# Patient Record
Sex: Male | Born: 1945 | Race: Black or African American | Hispanic: No | Marital: Married | Smoking: Former smoker
Health system: Southern US, Community
[De-identification: ages and names within clinical notes are randomized; demographics above are authoritative.]

## PROBLEM LIST (undated history)

## (undated) DIAGNOSIS — K760 Fatty (change of) liver, not elsewhere classified: Secondary | ICD-10-CM

## (undated) DIAGNOSIS — N400 Enlarged prostate without lower urinary tract symptoms: Secondary | ICD-10-CM

## (undated) DIAGNOSIS — E278 Other specified disorders of adrenal gland: Secondary | ICD-10-CM

## (undated) DIAGNOSIS — E119 Type 2 diabetes mellitus without complications: Secondary | ICD-10-CM

## (undated) DIAGNOSIS — G629 Polyneuropathy, unspecified: Secondary | ICD-10-CM

## (undated) DIAGNOSIS — I700 Atherosclerosis of aorta: Secondary | ICD-10-CM

---

## 2012-04-07 ENCOUNTER — Ambulatory Visit: Payer: Non-veteran care | Attending: Internal Medicine | Admitting: Physical Therapy

## 2012-04-07 DIAGNOSIS — R262 Difficulty in walking, not elsewhere classified: Secondary | ICD-10-CM | POA: Insufficient documentation

## 2012-04-07 DIAGNOSIS — IMO0001 Reserved for inherently not codable concepts without codable children: Secondary | ICD-10-CM | POA: Insufficient documentation

## 2012-04-21 ENCOUNTER — Ambulatory Visit: Payer: Non-veteran care | Admitting: Physical Therapy

## 2012-04-24 ENCOUNTER — Ambulatory Visit: Payer: Non-veteran care | Attending: Internal Medicine | Admitting: Physical Therapy

## 2012-04-24 DIAGNOSIS — R262 Difficulty in walking, not elsewhere classified: Secondary | ICD-10-CM | POA: Insufficient documentation

## 2012-04-24 DIAGNOSIS — IMO0001 Reserved for inherently not codable concepts without codable children: Secondary | ICD-10-CM | POA: Insufficient documentation

## 2012-04-28 ENCOUNTER — Ambulatory Visit: Payer: Non-veteran care | Admitting: Physical Therapy

## 2012-05-01 ENCOUNTER — Ambulatory Visit: Payer: Non-veteran care | Admitting: Physical Therapy

## 2012-05-05 ENCOUNTER — Ambulatory Visit: Payer: Non-veteran care | Admitting: Physical Therapy

## 2012-05-12 ENCOUNTER — Ambulatory Visit: Payer: Non-veteran care | Admitting: Physical Therapy

## 2012-06-25 ENCOUNTER — Ambulatory Visit: Payer: Non-veteran care | Attending: Internal Medicine | Admitting: Physical Therapy

## 2012-06-25 DIAGNOSIS — IMO0001 Reserved for inherently not codable concepts without codable children: Secondary | ICD-10-CM | POA: Insufficient documentation

## 2012-06-25 DIAGNOSIS — R262 Difficulty in walking, not elsewhere classified: Secondary | ICD-10-CM | POA: Insufficient documentation

## 2012-07-07 ENCOUNTER — Ambulatory Visit: Payer: Non-veteran care | Admitting: Physical Therapy

## 2012-07-10 ENCOUNTER — Ambulatory Visit: Payer: Non-veteran care | Admitting: Physical Therapy

## 2012-07-14 ENCOUNTER — Ambulatory Visit: Payer: Non-veteran care | Admitting: Physical Therapy

## 2012-07-17 ENCOUNTER — Ambulatory Visit: Payer: Non-veteran care | Admitting: Physical Therapy

## 2012-07-21 ENCOUNTER — Ambulatory Visit: Payer: Non-veteran care | Admitting: Physical Therapy

## 2012-07-24 ENCOUNTER — Ambulatory Visit: Payer: Non-veteran care | Attending: Internal Medicine | Admitting: Physical Therapy

## 2012-07-24 DIAGNOSIS — R262 Difficulty in walking, not elsewhere classified: Secondary | ICD-10-CM | POA: Insufficient documentation

## 2012-07-24 DIAGNOSIS — IMO0001 Reserved for inherently not codable concepts without codable children: Secondary | ICD-10-CM | POA: Insufficient documentation

## 2012-07-28 ENCOUNTER — Ambulatory Visit: Payer: Non-veteran care | Admitting: Physical Therapy

## 2012-07-31 ENCOUNTER — Ambulatory Visit: Payer: Non-veteran care | Admitting: Physical Therapy

## 2012-08-26 ENCOUNTER — Ambulatory Visit: Payer: Non-veteran care | Attending: Internal Medicine | Admitting: Physical Therapy

## 2012-08-26 DIAGNOSIS — IMO0001 Reserved for inherently not codable concepts without codable children: Secondary | ICD-10-CM | POA: Insufficient documentation

## 2012-08-26 DIAGNOSIS — R262 Difficulty in walking, not elsewhere classified: Secondary | ICD-10-CM | POA: Insufficient documentation

## 2012-08-28 ENCOUNTER — Ambulatory Visit: Payer: Non-veteran care | Admitting: Physical Therapy

## 2012-09-01 ENCOUNTER — Ambulatory Visit: Payer: Non-veteran care | Admitting: Physical Therapy

## 2012-09-04 ENCOUNTER — Ambulatory Visit: Payer: Non-veteran care | Admitting: Physical Therapy

## 2012-09-08 ENCOUNTER — Ambulatory Visit: Payer: Non-veteran care | Admitting: Physical Therapy

## 2012-09-11 ENCOUNTER — Ambulatory Visit: Payer: Non-veteran care | Admitting: Physical Therapy

## 2012-09-15 ENCOUNTER — Ambulatory Visit: Payer: Non-veteran care | Admitting: Physical Therapy

## 2012-09-22 ENCOUNTER — Ambulatory Visit: Payer: Non-veteran care | Admitting: Physical Therapy

## 2014-07-30 ENCOUNTER — Encounter (HOSPITAL_COMMUNITY): Payer: Self-pay | Admitting: Emergency Medicine

## 2014-07-30 ENCOUNTER — Emergency Department (HOSPITAL_COMMUNITY): Payer: Non-veteran care

## 2014-07-30 ENCOUNTER — Emergency Department (HOSPITAL_COMMUNITY)
Admission: EM | Admit: 2014-07-30 | Discharge: 2014-07-31 | Disposition: A | Discharge status: Home / Self Care | Payer: Non-veteran care | Attending: Emergency Medicine | Admitting: Emergency Medicine

## 2014-07-30 DIAGNOSIS — S59909A Unspecified injury of unspecified elbow, initial encounter: Secondary | ICD-10-CM | POA: Insufficient documentation

## 2014-07-30 DIAGNOSIS — S1093XA Contusion of unspecified part of neck, initial encounter: Secondary | ICD-10-CM

## 2014-07-30 DIAGNOSIS — Z79899 Other long term (current) drug therapy: Secondary | ICD-10-CM | POA: Insufficient documentation

## 2014-07-30 DIAGNOSIS — E119 Type 2 diabetes mellitus without complications: Secondary | ICD-10-CM | POA: Insufficient documentation

## 2014-07-30 DIAGNOSIS — Y9289 Other specified places as the place of occurrence of the external cause: Secondary | ICD-10-CM | POA: Insufficient documentation

## 2014-07-30 DIAGNOSIS — S6990XA Unspecified injury of unspecified wrist, hand and finger(s), initial encounter: Secondary | ICD-10-CM

## 2014-07-30 DIAGNOSIS — Z794 Long term (current) use of insulin: Secondary | ICD-10-CM | POA: Insufficient documentation

## 2014-07-30 DIAGNOSIS — W108XXA Fall (on) (from) other stairs and steps, initial encounter: Secondary | ICD-10-CM | POA: Insufficient documentation

## 2014-07-30 DIAGNOSIS — W1809XA Striking against other object with subsequent fall, initial encounter: Secondary | ICD-10-CM | POA: Insufficient documentation

## 2014-07-30 DIAGNOSIS — S0083XA Contusion of other part of head, initial encounter: Secondary | ICD-10-CM | POA: Insufficient documentation

## 2014-07-30 DIAGNOSIS — S0003XA Contusion of scalp, initial encounter: Secondary | ICD-10-CM | POA: Insufficient documentation

## 2014-07-30 DIAGNOSIS — Y9301 Activity, walking, marching and hiking: Secondary | ICD-10-CM | POA: Insufficient documentation

## 2014-07-30 DIAGNOSIS — S0191XA Laceration without foreign body of unspecified part of head, initial encounter: Secondary | ICD-10-CM

## 2014-07-30 DIAGNOSIS — S0990XA Unspecified injury of head, initial encounter: Secondary | ICD-10-CM | POA: Insufficient documentation

## 2014-07-30 DIAGNOSIS — Z87448 Personal history of other diseases of urinary system: Secondary | ICD-10-CM | POA: Insufficient documentation

## 2014-07-30 DIAGNOSIS — S59919A Unspecified injury of unspecified forearm, initial encounter: Secondary | ICD-10-CM

## 2014-07-30 DIAGNOSIS — S0100XA Unspecified open wound of scalp, initial encounter: Secondary | ICD-10-CM | POA: Insufficient documentation

## 2014-07-30 DIAGNOSIS — W19XXXA Unspecified fall, initial encounter: Secondary | ICD-10-CM

## 2014-07-30 DIAGNOSIS — T1490XA Injury, unspecified, initial encounter: Secondary | ICD-10-CM | POA: Diagnosis not present

## 2014-07-30 HISTORY — DX: Type 2 diabetes mellitus without complications: E11.9

## 2014-07-30 HISTORY — DX: Benign prostatic hyperplasia without lower urinary tract symptoms: N40.0

## 2014-07-30 LAB — I-STAT CHEM 8, ED
BUN: 13 mg/dL (ref 6–23)
CHLORIDE: 104 meq/L (ref 96–112)
Calcium, Ion: 1.09 mmol/L — ABNORMAL LOW (ref 1.13–1.30)
Creatinine, Ser: 1 mg/dL (ref 0.50–1.35)
Glucose, Bld: 168 mg/dL — ABNORMAL HIGH (ref 70–99)
HCT: 41 % (ref 39.0–52.0)
HEMOGLOBIN: 13.9 g/dL (ref 13.0–17.0)
Potassium: 3.4 mEq/L — ABNORMAL LOW (ref 3.7–5.3)
Sodium: 140 mEq/L (ref 137–147)
TCO2: 21 mmol/L (ref 0–100)

## 2014-07-30 LAB — BASIC METABOLIC PANEL
ANION GAP: 16 — AB (ref 5–15)
BUN: 13 mg/dL (ref 6–23)
CHLORIDE: 103 meq/L (ref 96–112)
CO2: 22 mEq/L (ref 19–32)
CREATININE: 0.98 mg/dL (ref 0.50–1.35)
Calcium: 9.1 mg/dL (ref 8.4–10.5)
GFR, EST NON AFRICAN AMERICAN: 83 mL/min — AB (ref >90)
Glucose, Bld: 165 mg/dL — ABNORMAL HIGH (ref 70–99)
Potassium: 3.8 mEq/L (ref 3.7–5.3)
Sodium: 141 mEq/L (ref 137–147)

## 2014-07-30 LAB — CBC
HCT: 38 % — ABNORMAL LOW (ref 39.0–52.0)
Hemoglobin: 12.2 g/dL — ABNORMAL LOW (ref 13.0–17.0)
MCH: 26.8 pg (ref 26.0–34.0)
MCHC: 32.1 g/dL (ref 30.0–36.0)
MCV: 83.5 fL (ref 78.0–100.0)
PLATELETS: 221 10*3/uL (ref 150–400)
RBC: 4.55 MIL/uL (ref 4.22–5.81)
RDW: 14.2 % (ref 11.5–15.5)
WBC: 13.5 10*3/uL — ABNORMAL HIGH (ref 4.0–10.5)

## 2014-07-30 LAB — PROTIME-INR
INR: 1.02 (ref 0.00–1.49)
Prothrombin Time: 13.4 seconds (ref 11.6–15.2)

## 2014-07-30 LAB — CBG MONITORING, ED: Glucose-Capillary: 158 mg/dL — ABNORMAL HIGH (ref 70–99)

## 2014-07-30 MED ORDER — LIDOCAINE HCL (PF) 1 % IJ SOLN
INTRAMUSCULAR | Status: AC
  Filled 2014-07-30: qty 5

## 2014-07-30 NOTE — ED Notes (Signed)
Per EMS, patient was walking up the stairs when he fell back, hit posterior head on wall. Unwitnessed fall, wife was gone approx 1 hour and \\found  patient at bottom of stairs. Probable LOC, pt originally confused, vomiting. Pt. Alert and oriented on arrival to ED

## 2014-07-30 NOTE — ED Notes (Addendum)
Pt transported to CT ?

## 2014-07-30 NOTE — ED Notes (Signed)
Pt placed on monitor upon arrival to room. Pt monitored by 12 lead, blood pressure, and pulse ox.  

## 2014-07-30 NOTE — ED Notes (Signed)
Pt placed back on monitor upon return to room from CT scan. Pt monitored by 12 lead, blood pressure, and pulse ox. Pt remains on 2L via [place]

## 2014-07-30 NOTE — ED Provider Notes (Signed)
I saw and evaluated the patient, reviewed the resident's note and I agree with the findings and plan.   EKG Interpretation   Date/Time:  Friday July 30 2014 20:47:22 EDT Ventricular Rate:  83 PR Interval:  151 QRS Duration: 94 QT Interval:  378 QTC Calculation: 444 R Axis:   87 Text Interpretation:  Sinus rhythm Borderline right axis deviation Minimal  ST depression, inferior leads No previous ECGs available Confirmed by  Nels Munn  MD, Evamarie Raetz [phone]) on 07/30/2014 9:02:14 PM      Results for orders placed during the hospital encounter of 07/30/14  CBC      Result Value Ref Range   WBC 13.5 (*) 4.0 - 10.5 K/uL   RBC 4.55  4.22 - 5.81 MIL/uL   Hemoglobin 12.2 (*) 13.0 - 17.0 g/dL   HCT 60.4 (*) 54.0 - 98.1 %   MCV 83.5  78.0 - 100.0 fL   MCH 26.8  26.0 - 34.0 pg   MCHC 32.1  30.0 - 36.0 g/dL   RDW 19.1  47.8 - 29.5 %   Platelets 221  150 - 400 K/uL  BASIC METABOLIC PANEL      Result Value Ref Range   Sodium 141  137 - 147 mEq/L   Potassium 3.8  3.7 - 5.3 mEq/L   Chloride 103  96 - 112 mEq/L   CO2 22  19 - 32 mEq/L   Glucose, Bld 165 (*) 70 - 99 mg/dL   BUN 13  6 - 23 mg/dL   Creatinine, Ser 6.21  0.50 - 1.35 mg/dL   Calcium 9.1  8.4 - 30.8 mg/dL   GFR calc non Af Amer 83 (*) >90 mL/min   GFR calc Af Amer >90  >90 mL/min   Anion gap 16 (*) 5 - 15  PROTIME-INR      Result Value Ref Range   Prothrombin Time 13.4  11.6 - 15.2 seconds   INR 1.02  0.00 - 1.49  CBG MONITORING, ED      Result Value Ref Range   Glucose-Capillary 158 (*) 70 - 99 mg/dL  I-STAT CHEM 8, ED      Result Value Ref Range   Sodium 140  137 - 147 mEq/L   Potassium 3.4 (*) 3.7 - 5.3 mEq/L   Chloride 104  96 - 112 mEq/L   BUN 13  6 - 23 mg/dL   Creatinine, Ser 6.57  0.50 - 1.35 mg/dL   Glucose, Bld 846 (*) 70 - 99 mg/dL   Calcium, Ion 9.62 (*) 1.13 - 1.30 mmol/L   TCO2 21  0 - 100 mmol/L   Hemoglobin 13.9  13.0 - 17.0 g/dL   HCT 95.2  84.1 - 32.4 %   Ct Head Wo Contrast  07/30/2014   CLINICAL  DATA:  FALL  EXAM: CT HEAD WITHOUT CONTRAST  CT CERVICAL SPINE WITHOUT CONTRAST  TECHNIQUE: Multidetector CT imaging of the head and cervical spine was performed following the standard protocol without intravenous contrast. Multiplanar CT image reconstructions of the cervical spine were also generated.  COMPARISON:  None.  FINDINGS: CT HEAD FINDINGS  Mild atrophy with chronic small vessel ischemic changes present.  There is no acute intracranial hemorrhage or infarct. No mass lesion or midline shift. Gray-white matter differentiation is well maintained. Ventricles are normal in size without evidence of hydrocephalus. CSF containing spaces are within normal limits. No extra-axial fluid collection.  The calvarium is intact.  Orbital soft tissues are within normal limits.  Moderate opacity present within the left frontoethmoidal recess. Otherwise, the paranasal sinuses are largely clear. No mastoid effusion.  Left parieto-occipital scalp contusion present.  CT CERVICAL SPINE FINDINGS  The vertebral bodies are normally aligned with preservation of the normal cervical lordosis. Vertebral body heights are preserved. Normal C1-2 articulations are intact. No prevertebral soft tissue swelling. No acute fracture or listhesis.  Fairly severe multilevel degenerative disc disease seen throughout the cervical spine as evidenced by intervertebral disc space narrowing, endplate sclerosis, and osteophytosis. Findings most prevalent at C5-6 and C6-7.  Visualized soft tissues of the neck are within normal limits. Visualized lung apices are clear without evidence of apical pneumothorax.  IMPRESSION: CT BRAIN:  1. No acute intracranial process. 2. Left parieto-occipital scalp contusion. CT CERVICAL SPINE:  1. No acute traumatic injury within the cervical spine. 2. Advanced multilevel degenerative disc disease throughout the cervical spine most severe at C5-6 and C6-7.   Electronically Signed   By: Rise Mu M.D.   On:  07/30/2014 22:33   Ct Cervical Spine Wo Contrast  07/30/2014   CLINICAL DATA:  FALL  EXAM: CT HEAD WITHOUT CONTRAST  CT CERVICAL SPINE WITHOUT CONTRAST  TECHNIQUE: Multidetector CT imaging of the head and cervical spine was performed following the standard protocol without intravenous contrast. Multiplanar CT image reconstructions of the cervical spine were also generated.  COMPARISON:  None.  FINDINGS: CT HEAD FINDINGS  Mild atrophy with chronic small vessel ischemic changes present.  There is no acute intracranial hemorrhage or infarct. No mass lesion or midline shift. Gray-white matter differentiation is well maintained. Ventricles are normal in size without evidence of hydrocephalus. CSF containing spaces are within normal limits. No extra-axial fluid collection.  The calvarium is intact.  Orbital soft tissues are within normal limits.  Moderate opacity present within the left frontoethmoidal recess. Otherwise, the paranasal sinuses are largely clear. No mastoid effusion.  Left parieto-occipital scalp contusion present.  CT CERVICAL SPINE FINDINGS  The vertebral bodies are normally aligned with preservation of the normal cervical lordosis. Vertebral body heights are preserved. Normal C1-2 articulations are intact. No prevertebral soft tissue swelling. No acute fracture or listhesis.  Fairly severe multilevel degenerative disc disease seen throughout the cervical spine as evidenced by intervertebral disc space narrowing, endplate sclerosis, and osteophytosis. Findings most prevalent at C5-6 and C6-7.  Visualized soft tissues of the neck are within normal limits. Visualized lung apices are clear without evidence of apical pneumothorax.  IMPRESSION: CT BRAIN:  1. No acute intracranial process. 2. Left parieto-occipital scalp contusion. CT CERVICAL SPINE:  1. No acute traumatic injury within the cervical spine. 2. Advanced multilevel degenerative disc disease throughout the cervical spine most severe at C5-6 and  C6-7.   Electronically Signed   By: Rise Mu M.D.   On: 07/30/2014 22:33   Dg Chest Portable 1 View  07/30/2014   CLINICAL DATA:  Syncope and fall. Headache and dizziness. Cough. History of smoking.  EXAM: PORTABLE CHEST - 1 VIEW  COMPARISON:  None.  FINDINGS: The lungs are hypoexpanded. Mild vascular congestion is noted. There is no evidence of focal opacification, pleural effusion or pneumothorax.  The cardiomediastinal silhouette is borderline normal in size. No acute osseous abnormalities are seen.  IMPRESSION: Lungs hypoexpanded. Mild vascular congestion noted. No displaced rib fracture seen.   Electronically Signed   By: Roanna Raider M.D.   On: 07/30/2014 23:09    Patient seen by me. Patient plain x-rays are still pending. Patient with unwitnessed fall  at home possibly, fallen down some stairs. Patient's wife had left for about an hour when she came back found at the bottom of the stairs she supposed to use a chair lift to go upstairs. Patient was originally confused and had vomited. Patient now alert and oriented upon arrival to the emergency department. Head CT without significant findings. Patient did have a scalp hematoma and laceration to the left occiput area. This was repaired with sutures by the resident. Patient CT of the neck was negative as well. Plain films of the wrist in others are still pending. If negative patient can be discharged home.  Vanetta MuldersScott Bravery Ketcham, MD 07/30/14 [phone]

## 2014-07-30 NOTE — ED Notes (Signed)
Pt. States he was walking up the stairs and lost his balance and fell backwards down the stairs. Denies dizziness or CP prior to fall. Per EMS pt originally was confused, he is alert and oriented at this time. Only c/o head pain, denies neck or back pain. No other injuries noted. Hematoma to left posterior head. Pt does not take blood thinners.

## 2014-07-30 NOTE — ED Provider Notes (Signed)
CSN: [id number]     Arrival date & time 07/30/14  2040 History   First MD Initiated Contact with Patient 07/30/14 2105     Chief Complaint  Patient presents with  . Fall   Hector Taft is a 68 year old AAM with PMH of DM who presents today after fall.  Patient is supposed to use a chairlift. However he did not do so today. This is likely to have occurred around 7 PM. Nobody else was at home with him. Hours later patient was found down and brought in by EMS. Patient was found at the bottom of the stairs at home. Does not believe that she lost consciousness. Had no illness or bad feelings prior to falling. Patient says he slipped. EMS placed patient in a c-collar patient. He does not complain of any neck or head pain. He does endorse that his right forearm hurts a little. It's a mild, aching or bruising sensation. Tdap UTD.  He denies palpitations, CP, SOB, fever, chills, constipation, hematemesis, dysuria, hematuria, sick contacts, or recent travel.   (Consider location/radiation/quality/duration/timing/severity/associated sxs/prior Treatment) Patient is a 68 y.o. male presenting with fall. The history is provided by the patient and a relative.  Fall This is a new problem. The current episode started today. Pertinent negatives include no abdominal pain, arthralgias, chest pain, chills, congestion, coughing, diaphoresis, fatigue, fever, headaches, myalgias, nausea, neck pain, numbness, urinary symptoms, visual change, vomiting or weakness. Nothing aggravates the symptoms. He has tried nothing for the symptoms.    Past Medical History  Diagnosis Date  . Diabetes mellitus without complication   . BPH (benign prostatic hyperplasia)    History reviewed. No pertinent past surgical history. No family history on file. History  Substance Use Topics  . Smoking status: Never Smoker   . Smokeless tobacco: Not on file  . Alcohol Use: No    Review of Systems  Constitutional: Negative for fever,  chills, diaphoresis and fatigue.  HENT: Negative for congestion.   Respiratory: Negative for cough and shortness of breath.   Cardiovascular: Negative for chest pain, palpitations and leg swelling.  Gastrointestinal: Negative for nausea, vomiting, abdominal pain, diarrhea, constipation and abdominal distention.  Genitourinary: Negative for dysuria, frequency, flank pain and decreased urine volume.  Musculoskeletal: Negative for arthralgias, myalgias and neck pain.  Neurological: Negative for dizziness, speech difficulty, weakness, light-headedness, numbness and headaches.  All other systems reviewed and are negative.     Allergies  Review of patient's allergies indicates no known allergies.  Home Medications   Prior to Admission medications   Medication Sig Start Date End Date Taking? Authorizing Provider  atorvastatin (LIPITOR) 80 MG tablet Take 40 mg by mouth daily.   Yes Historical Provider, MD  finasteride (PROSCAR) 5 MG tablet Take 5 mg by mouth at bedtime.   Yes Historical Provider, MD  glipiZIDE (GLUCOTROL) 10 MG tablet Take 20 mg by mouth 2 (two) times daily before a meal.   Yes Historical Provider, MD  insulin glargine (LANTUS) 100 UNIT/ML injection Inject 15 Units into the skin at bedtime.   Yes Historical Provider, MD  losartan (COZAAR) 50 MG tablet Take 50 mg by mouth daily.   Yes Historical Provider, MD  metFORMIN (GLUCOPHAGE) 1000 MG tablet Take 1,000 mg by mouth 2 (two) times daily with a meal.   Yes Historical Provider, MD  prazosin (MINIPRESS) 2 MG capsule Take 4 mg by mouth at bedtime.   Yes Historical Provider, MD   BP 150/57  Pulse 88  Temp(Src) 97.8  F (36.6 C) (Oral)  Resp 13  Ht 5\' 10"  (1.778 m)  Wt 227 lb (102.967 kg)  BMI 32.57 kg/m2  SpO2 94% Physical Exam  Nursing note and vitals reviewed. Constitutional: He is oriented to person, place, and time. He appears well-developed and well-nourished. No distress.  HENT:  Head: Normocephalic and atraumatic.      Eyes: Pupils are equal, round, and reactive to light.  Neck: Normal range of motion.  Cardiovascular: Normal rate, regular rhythm, normal heart sounds and intact distal pulses.  Exam reveals no gallop and no friction rub.   No murmur heard. Pulmonary/Chest: Effort normal and breath sounds normal. No respiratory distress. He has no wheezes. He has no rales. He exhibits no tenderness.  Abdominal: Soft. Bowel sounds are normal. He exhibits no distension and no mass. There is no tenderness. There is no rebound and no guarding.  Musculoskeletal: Normal range of motion. He exhibits tenderness (right anterior forearm). He exhibits no edema.  Lymphadenopathy:    He has no cervical adenopathy.  Neurological: He is alert and oriented to person, place, and time. No cranial nerve deficit. Coordination normal.  Skin: Skin is warm and dry. He is not diaphoretic.    ED Course  LACERATION REPAIR Date/Time: 07/31/2014 12:12 AM Performed by: Rachelle Hora Authorized by: Rachelle Hora Consent: Verbal consent obtained. Risks and benefits: risks, benefits and alternatives were discussed Patient identity confirmed: verbally with patient Time out: Immediately prior to procedure a "time out" was called to verify the correct patient, procedure, equipment, support staff and site/side marked as required. Body area: head/neck Location details: scalp Laceration length: 3 cm Foreign bodies: no foreign bodies Tendon involvement: none Nerve involvement: none Vascular damage: no Anesthesia: local infiltration Local anesthetic: lidocaine 1% without epinephrine Anesthetic total: 5 ml Patient sedated: no Preparation: Patient was prepped and draped in the usual sterile fashion. Irrigation solution: saline Irrigation method: syringe Amount of cleaning: standard Debridement: none Degree of undermining: none Skin closure: 4-0 Prolene Number of sutures: 3 Technique: simple Approximation: close Dressing: 4x4  sterile gauze Patient tolerance: Patient tolerated the procedure well with no immediate complications.   (including critical care time) Labs Review Labs Reviewed  CBC - Abnormal; Notable for the following:    WBC 13.5 (*)    Hemoglobin 12.2 (*)    HCT 38.0 (*)    All other components within normal limits  BASIC METABOLIC PANEL - Abnormal; Notable for the following:    Glucose, Bld 165 (*)    GFR calc non Af Amer 83 (*)    Anion gap 16 (*)    All other components within normal limits  URINALYSIS, ROUTINE W REFLEX MICROSCOPIC - Abnormal; Notable for the following:    APPearance TURBID (*)    Specific Gravity, Urine 1.031 (*)    Glucose, UA 250 (*)    Ketones, ur 15 (*)    Protein, ur 30 (*)    All other components within normal limits  CBG MONITORING, ED - Abnormal; Notable for the following:    Glucose-Capillary 158 (*)    All other components within normal limits  I-STAT CHEM 8, ED - Abnormal; Notable for the following:    Potassium 3.4 (*)    Glucose, Bld 168 (*)    Calcium, Ion 1.09 (*)    All other components within normal limits  PROTIME-INR  URINE MICROSCOPIC-ADD ON    Imaging Review Dg Elbow 2 Views Right  07/31/2014   CLINICAL DATA:  Fall  EXAM:  RIGHT ELBOW - 2 VIEW  COMPARISON:  None.  FINDINGS: There is no evidence of fracture, dislocation, or joint effusion. No focal osseous lesion. Degenerative spurring seen at the radial head and olecranon. Soft tissues are unremarkable.  IMPRESSION: No acute fracture or dislocation.   Electronically Signed   By: Rise MuBenjamin  McClintock M.D.   On: 07/31/2014 01:10   Dg Forearm Right  07/31/2014   CLINICAL DATA:  Status post fall down steps. Right arm and wrist pain.  EXAM: RIGHT FOREARM - 2 VIEW  COMPARISON:  None.  FINDINGS: There is no evidence of fracture or dislocation. The radius and ulna appear intact. The elbow joint is incompletely assessed, but appears grossly unremarkable. The carpal rows are grossly intact and demonstrate  normal alignment. No significant soft tissue abnormalities are characterized on radiograph.  IMPRESSION: No evidence of fracture or dislocation.   Electronically Signed   By: Roanna RaiderJeffery  Chang M.D.   On: 07/31/2014 01:10   Dg Wrist Complete Right  07/31/2014   CLINICAL DATA:  Status post fall down steps. Right arm and wrist pain.  EXAM: RIGHT WRIST - COMPLETE 3+ VIEW  COMPARISON:  None.  FINDINGS: There is no evidence of fracture or dislocation. The carpal rows are intact, and demonstrate normal alignment. Minimal degenerative change is noted at the radial aspect of the carpal rows.  No significant soft tissue abnormalities are seen. A peripheral IV catheter is seen overlying the proximal hand.  IMPRESSION: No evidence of fracture or dislocation.   Electronically Signed   By: Roanna RaiderJeffery  Chang M.D.   On: 07/31/2014 01:11   Ct Head Wo Contrast  07/30/2014   CLINICAL DATA:  FALL  EXAM: CT HEAD WITHOUT CONTRAST  CT CERVICAL SPINE WITHOUT CONTRAST  TECHNIQUE: Multidetector CT imaging of the head and cervical spine was performed following the standard protocol without intravenous contrast. Multiplanar CT image reconstructions of the cervical spine were also generated.  COMPARISON:  None.  FINDINGS: CT HEAD FINDINGS  Mild atrophy with chronic small vessel ischemic changes present.  There is no acute intracranial hemorrhage or infarct. No mass lesion or midline shift. Gray-white matter differentiation is well maintained. Ventricles are normal in size without evidence of hydrocephalus. CSF containing spaces are within normal limits. No extra-axial fluid collection.  The calvarium is intact.  Orbital soft tissues are within normal limits.  Moderate opacity present within the left frontoethmoidal recess. Otherwise, the paranasal sinuses are largely clear. No mastoid effusion.  Left parieto-occipital scalp contusion present.  CT CERVICAL SPINE FINDINGS  The vertebral bodies are normally aligned with preservation of the normal  cervical lordosis. Vertebral body heights are preserved. Normal C1-2 articulations are intact. No prevertebral soft tissue swelling. No acute fracture or listhesis.  Fairly severe multilevel degenerative disc disease seen throughout the cervical spine as evidenced by intervertebral disc space narrowing, endplate sclerosis, and osteophytosis. Findings most prevalent at C5-6 and C6-7.  Visualized soft tissues of the neck are within normal limits. Visualized lung apices are clear without evidence of apical pneumothorax.  IMPRESSION: CT BRAIN:  1. No acute intracranial process. 2. Left parieto-occipital scalp contusion. CT CERVICAL SPINE:  1. No acute traumatic injury within the cervical spine. 2. Advanced multilevel degenerative disc disease throughout the cervical spine most severe at C5-6 and C6-7.   Electronically Signed   By: Rise MuBenjamin  McClintock M.D.   On: 07/30/2014 22:33   Ct Cervical Spine Wo Contrast  07/30/2014   CLINICAL DATA:  FALL  EXAM: CT HEAD WITHOUT CONTRAST  CT CERVICAL SPINE WITHOUT CONTRAST  TECHNIQUE: Multidetector CT imaging of the head and cervical spine was performed following the standard protocol without intravenous contrast. Multiplanar CT image reconstructions of the cervical spine were also generated.  COMPARISON:  None.  FINDINGS: CT HEAD FINDINGS  Mild atrophy with chronic small vessel ischemic changes present.  There is no acute intracranial hemorrhage or infarct. No mass lesion or midline shift. Gray-white matter differentiation is well maintained. Ventricles are normal in size without evidence of hydrocephalus. CSF containing spaces are within normal limits. No extra-axial fluid collection.  The calvarium is intact.  Orbital soft tissues are within normal limits.  Moderate opacity present within the left frontoethmoidal recess. Otherwise, the paranasal sinuses are largely clear. No mastoid effusion.  Left parieto-occipital scalp contusion present.  CT CERVICAL SPINE FINDINGS  The  vertebral bodies are normally aligned with preservation of the normal cervical lordosis. Vertebral body heights are preserved. Normal C1-2 articulations are intact. No prevertebral soft tissue swelling. No acute fracture or listhesis.  Fairly severe multilevel degenerative disc disease seen throughout the cervical spine as evidenced by intervertebral disc space narrowing, endplate sclerosis, and osteophytosis. Findings most prevalent at C5-6 and C6-7.  Visualized soft tissues of the neck are within normal limits. Visualized lung apices are clear without evidence of apical pneumothorax.  IMPRESSION: CT BRAIN:  1. No acute intracranial process. 2. Left parieto-occipital scalp contusion. CT CERVICAL SPINE:  1. No acute traumatic injury within the cervical spine. 2. Advanced multilevel degenerative disc disease throughout the cervical spine most severe at C5-6 and C6-7.   Electronically Signed   By: Rise Mu M.D.   On: 07/30/2014 22:33   Dg Chest Portable 1 View  07/30/2014   CLINICAL DATA:  Syncope and fall. Headache and dizziness. Cough. History of smoking.  EXAM: PORTABLE CHEST - 1 VIEW  COMPARISON:  None.  FINDINGS: The lungs are hypoexpanded. Mild vascular congestion is noted. There is no evidence of focal opacification, pleural effusion or pneumothorax.  The cardiomediastinal silhouette is borderline normal in size. No acute osseous abnormalities are seen.  IMPRESSION: Lungs hypoexpanded. Mild vascular congestion noted. No displaced rib fracture seen.   Electronically Signed   By: Roanna Raider M.D.   On: 07/30/2014 23:09     EKG Interpretation   Date/Time:  Friday July 30 2014 20:47:22 EDT Ventricular Rate:  83 PR Interval:  151 QRS Duration: 94 QT Interval:  378 QTC Calculation: 444 R Axis:   87 Text Interpretation:  Sinus rhythm Borderline right axis deviation Minimal  ST depression, inferior leads No previous ECGs available Confirmed by  ZACKOWSKI  MD, SCOTT [phone]) on 07/30/2014  9:02:14 PM      MDM   68 year old AAM who presents today after fall. Please see history of present illness for details. On exam patient in NAD, AF VSS. Patient has small lac to the left posterior scalp no active bleeding. Denies any blood thinners. No focal neural deficits. No battle sign, TMs clear bilaterally, no nasal septal hematoma.  Patient endorses tenderness to palpation over the anterior right proximal forearm. No bruising or lacerations here. Will obtain CT head and neck, basic labs, UA, CXR, EKG, and x-ray images of the right wrist, forearm, and elbow.  CT head and neck within normal limits with no fractures and no intracranial hemorrhage. At that time at this time c-collar was removed the patient has normal range of motion with no pain. C-spine clear. Basic labs within normal limits with EKG showing no  signs of ischemia, arrhythmia. CXR w/no fxr or cardiovascular abnormality.  Lac repair was done with 3 sutures of Prolene. Please see lactic repair note above. Patient tolerated this well. Awaiting x-ray imaging.   X-rays of his right wrist forearm and elbow all within normal limits, no acute fracture. Stable for discharge home at this time. Will follow up with primary care provider [facility]) in 7-14 days for suture removal and wound check. Strict return precautions include worsening falls presyncope or syncopal episodes, severe headache or focal neural deficits/AMS.  Final diagnoses:  Fall, initial encounter  Laceration of head, initial encounter  Scalp hematoma, initial encounter    Pt was seen under the supervision of Dr. Deretha Emory.     Rachelle Hora, MD 07/31/14 1610  Rachelle Hora, MD 07/31/14 [phone]

## 2014-07-31 ENCOUNTER — Emergency Department (HOSPITAL_COMMUNITY): Payer: Non-veteran care

## 2014-07-31 LAB — URINALYSIS, ROUTINE W REFLEX MICROSCOPIC
BILIRUBIN URINE: NEGATIVE
Glucose, UA: 250 mg/dL — AB
HGB URINE DIPSTICK: NEGATIVE
Ketones, ur: 15 mg/dL — AB
Leukocytes, UA: NEGATIVE
Nitrite: NEGATIVE
PROTEIN: 30 mg/dL — AB
Specific Gravity, Urine: 1.031 — ABNORMAL HIGH (ref 1.005–1.030)
UROBILINOGEN UA: 0.2 mg/dL (ref 0.0–1.0)
pH: 5 (ref 5.0–8.0)

## 2014-07-31 LAB — URINE MICROSCOPIC-ADD ON

## 2014-07-31 NOTE — Discharge Instructions (Signed)
Laceration Care, Adult °A laceration is a cut or lesion that goes through all layers of the skin and into the tissue just beneath the skin. °TREATMENT  °Some lacerations may not require closure. Some lacerations may not be able to be closed due to an increased risk of infection. It is important to see your caregiver as soon as possible after an injury to minimize the risk of infection and maximize the opportunity for successful closure. °If closure is appropriate, pain medicines may be given, if needed. The wound will be cleaned to help prevent infection. Your caregiver will use stitches (sutures), staples, wound glue (adhesive), or skin adhesive strips to repair the laceration. These tools bring the skin edges together to allow for faster healing and a better cosmetic outcome. However, all wounds will heal with a scar. Once the wound has healed, scarring can be minimized by covering the wound with sunscreen during the day for 1 full year. °HOME CARE INSTRUCTIONS  °For sutures or staples: °· Keep the wound clean and dry. °· If you were given a bandage (dressing), you should change it at least once a day. Also, change the dressing if it becomes wet or dirty, or as directed by your caregiver. °· Wash the wound with soap and water 2 times a day. Rinse the wound off with water to remove all soap. Pat the wound dry with a clean towel. °· After cleaning, apply a thin layer of the antibiotic ointment as recommended by your caregiver. This will help prevent infection and keep the dressing from sticking. °· You may shower as usual after the first 24 hours. Do not soak the wound in water until the sutures are removed. °· Only take over-the-counter or prescription medicines for pain, discomfort, or fever as directed by your caregiver. °· Get your sutures or staples removed as directed by your caregiver. °For skin adhesive strips: °· Keep the wound clean and dry. °· Do not get the skin adhesive strips wet. You may bathe  carefully, using caution to keep the wound dry. °· If the wound gets wet, pat it dry with a clean towel. °· Skin adhesive strips will fall off on their own. You may trim the strips as the wound heals. Do not remove skin adhesive strips that are still stuck to the wound. They will fall off in time. °For wound adhesive: °· You may briefly wet your wound in the shower or bath. Do not soak or scrub the wound. Do not swim. Avoid periods of heavy perspiration until the skin adhesive has fallen off on its own. After showering or bathing, gently pat the wound dry with a clean towel. °· Do not apply liquid medicine, cream medicine, or ointment medicine to your wound while the skin adhesive is in place. This may loosen the film before your wound is healed. °· If a dressing is placed over the wound, be careful not to apply tape directly over the skin adhesive. This may cause the adhesive to be pulled off before the wound is healed. °· Avoid prolonged exposure to sunlight or tanning lamps while the skin adhesive is in place. Exposure to ultraviolet light in the first year will darken the scar. °· The skin adhesive will usually remain in place for 5 to 10 days, then naturally fall off the skin. Do not pick at the adhesive film. °You may need a tetanus shot if: °· You cannot remember when you had your last tetanus shot. °· You have never had a tetanus   shot. If you get a tetanus shot, your arm may swell, get red, and feel warm to the touch. This is common and not a problem. If you need a tetanus shot and you choose not to have one, there is a rare chance of getting tetanus. Sickness from tetanus can be serious. SEEK MEDICAL CARE IF:   You have redness, swelling, or increasing pain in the wound.  You see a red line that goes away from the wound.  You have yellowish-white fluid (pus) coming from the wound.  You have a fever.  You notice a bad smell coming from the wound or dressing.  Your wound breaks open before or  after sutures have been removed.  You notice something coming out of the wound such as wood or glass.  Your wound is on your hand or foot and you cannot move a finger or toe. SEEK IMMEDIATE MEDICAL CARE IF:   Your pain is not controlled with prescribed medicine.  You have severe swelling around the wound causing pain and numbness or a change in color in your arm, hand, leg, or foot.  Your wound splits open and starts bleeding.  You have worsening numbness, weakness, or loss of function of any joint around or beyond the wound.  You develop painful lumps near the wound or on the skin anywhere on your body. MAKE SURE YOU:   Understand these instructions.  Will watch your condition.  Will get help right away if you are not doing well or get worse. Document Released: 12/10/2005 Document Revised: 03/03/2012 Document Reviewed: 06/05/2011 [facility] Patient Information 2015 [place]. This information is not intended to replace advice given to you by your health care provider. Make sure you discuss any questions you have with your health care provider.   Concussion A concussion, or closed-head injury, is a brain injury caused by a direct blow to the head or by a quick and sudden movement (jolt) of the head or neck. Concussions are usually not life-threatening. Even so, the effects of a concussion can be serious. If you have had a concussion before, you are more likely to experience concussion-like symptoms after a direct blow to the head.  CAUSES  Direct blow to the head, such as from running into another player during a soccer game, being hit in a fight, or hitting your head on a hard surface.  A jolt of the head or neck that causes the brain to move back and forth inside the skull, such as in a car crash. SIGNS AND SYMPTOMS The signs of a concussion can be hard to notice. Early on, they may be missed by you, family members, and health care providers. You may look fine but act or feel  differently. Symptoms are usually temporary, but they may last for days, weeks, or even longer. Some symptoms may appear right away while others may not show up for hours or days. Every head injury is different. Symptoms include:  Mild to moderate headaches that will not go away.  A feeling of pressure inside your head.  Having more trouble than usual:  Learning or remembering things you have heard.  Answering questions.  Paying attention or concentrating.  Organizing daily tasks.  Making decisions and solving problems.  Slowness in thinking, acting or reacting, speaking, or reading.  Getting lost or being easily confused.  Feeling tired all the time or lacking energy (fatigued).  Feeling drowsy.  Sleep disturbances.  Sleeping more than usual.  Sleeping less than usual.  Trouble falling asleep.  Trouble sleeping (insomnia).  Loss of balance or feeling lightheaded or dizzy.  Nausea or vomiting.  Numbness or tingling.  Increased sensitivity to:  Sounds.  Lights.  Distractions.  Vision problems or eyes that tire easily.  Diminished sense of taste or smell.  Ringing in the ears.  Mood changes such as feeling sad or anxious.  Becoming easily irritated or angry for little or no reason.  Lack of motivation.  Seeing or hearing things other people do not see or hear (hallucinations). DIAGNOSIS Your health care provider can usually diagnose a concussion based on a description of your injury and symptoms. He or she will ask whether you passed out (lost consciousness) and whether you are having trouble remembering events that happened right before and during your injury. Your evaluation might include:  A brain scan to look for signs of injury to the brain. Even if the test shows no injury, you may still have a concussion.  Blood tests to be sure other problems are not present. TREATMENT  Concussions are usually treated in an emergency department, in urgent  care, or at a clinic. You may need to stay in the hospital overnight for further treatment.  Tell your health care provider if you are taking any medicines, including prescription medicines, over-the-counter medicines, and natural remedies. Some medicines, such as blood thinners (anticoagulants) and aspirin, may increase the chance of complications. Also tell your health care provider whether you have had alcohol or are taking illegal drugs. This information may affect treatment.  Your health care provider will send you home with important instructions to follow.  How fast you will recover from a concussion depends on many factors. These factors include how severe your concussion is, what part of your brain was injured, your age, and how healthy you were before the concussion.  Most people with mild injuries recover fully. Recovery can take time. In general, recovery is slower in older persons. Also, persons who have had a concussion in the past or have other medical problems may find that it takes longer to recover from their current injury. HOME CARE INSTRUCTIONS General Instructions  Carefully follow the directions your health care provider gave you.  Only take over-the-counter or prescription medicines for pain, discomfort, or fever as directed by your health care provider.  Take only those medicines that your health care provider has approved.  Do not drink alcohol until your health care provider says you are well enough to do so. Alcohol and certain other drugs may slow your recovery and can put you at risk of further injury.  If it is harder than usual to remember things, write them down.  If you are easily distracted, try to do one thing at a time. For example, do not try to watch TV while fixing dinner.  Talk with family members or close friends when making important decisions.  Keep all follow-up appointments. Repeated evaluation of your symptoms is recommended for your  recovery.  Watch your symptoms and tell others to do the same. Complications sometimes occur after a concussion. Older adults with a brain injury may have a higher risk of serious complications, such as a blood clot on the brain.  Tell your teachers, school nurse, school counselor, coach, athletic trainer, or work Production designer, theatre/television/film about your injury, symptoms, and restrictions. Tell them about what you can or cannot do. They should watch for:  Increased problems with attention or concentration.  Increased difficulty remembering or learning new information.  Increased time needed to complete tasks or assignments.  Increased irritability or decreased ability to cope with stress.  Increased symptoms.  Rest. Rest helps the brain to heal. Make sure you:  Get plenty of sleep at night. Avoid staying up late at night.  Keep the same bedtime hours on weekends and weekdays.  Rest during the day. Take daytime naps or rest breaks when you feel tired.  Limit activities that require a lot of thought or concentration. These include:  Doing homework or job-related work.  Watching TV.  Working on the computer.  Avoid any situation where there is potential for another head injury (football, hockey, soccer, basketball, martial arts, downhill snow sports and horseback riding). Your condition will get worse every time you experience a concussion. You should avoid these activities until you are evaluated by the appropriate follow-up health care providers. Returning To Your Regular Activities You will need to return to your normal activities slowly, not all at once. You must give your body and brain enough time for recovery.  Do not return to sports or other athletic activities until your health care provider tells you it is safe to do so.  Ask your health care provider when you can drive, ride a bicycle, or operate heavy machinery. Your ability to react may be slower after a brain injury. Never do these  activities if you are dizzy.  Ask your health care provider about when you can return to work or school. Preventing Another Concussion It is very important to avoid another brain injury, especially before you have recovered. In rare cases, another injury can lead to permanent brain damage, brain swelling, or death. The risk of this is greatest during the first 7-10 days after a head injury. Avoid injuries by:  Wearing a seat belt when riding in a car.  Drinking alcohol only in moderation.  Wearing a helmet when biking, skiing, skateboarding, skating, or doing similar activities.  Avoiding activities that could lead to a second concussion, such as contact or recreational sports, until your health care provider says it is okay.  Taking safety measures in your home.  Remove clutter and tripping hazards from floors and stairways.  Use grab bars in bathrooms and handrails by stairs.  Place non-slip mats on floors and in bathtubs.  Improve lighting in dim areas. SEEK MEDICAL CARE IF:  You have increased problems paying attention or concentrating.  You have increased difficulty remembering or learning new information.  You need more time to complete tasks or assignments than before.  You have increased irritability or decreased ability to cope with stress.  You have more symptoms than before. Seek medical care if you have any of the following symptoms for more than 2 weeks after your injury:  Lasting (chronic) headaches.  Dizziness or balance problems.  Nausea.  Vision problems.  Increased sensitivity to noise or light.  Depression or mood swings.  Anxiety or irritability.  Memory problems.  Difficulty concentrating or paying attention.  Sleep problems.  Feeling tired all the time. SEEK IMMEDIATE MEDICAL CARE IF:  You have severe or worsening headaches. These may be a sign of a blood clot in the brain.  You have weakness (even if only in one hand, leg, or part of  the face).  You have numbness.  You have decreased coordination.  You vomit repeatedly.  You have increased sleepiness.  One pupil is larger than the other.  You have convulsions.  You have slurred speech.  You have increased  confusion. This may be a sign of a blood clot in the brain.  You have increased restlessness, agitation, or irritability.  You are unable to recognize people or places.  You have neck pain.  It is difficult to wake you up.  You have unusual behavior changes.  You lose consciousness. MAKE SURE YOU:  Understand these instructions.  Will watch your condition.  Will get help right away if you are not doing well or get worse. Document Released: 03/01/2004 Document Revised: 12/15/2013 Document Reviewed: 07/02/2013 [facility] Patient Information 2015 [place], [place]. This information is not intended to replace advice given to you by your health care provider. Make sure you discuss any questions you have with your health care provider.

## 2014-07-31 NOTE — ED Notes (Signed)
Pt in xray

## 2015-09-15 ENCOUNTER — Emergency Department (INDEPENDENT_AMBULATORY_CARE_PROVIDER_SITE_OTHER)
Admission: EM | Admit: 2015-09-15 | Discharge: 2015-09-15 | Disposition: A | Discharge status: Nursing Home (Includes ICF) | Payer: Medicare Other | Source: Home / Self Care | Attending: Family Medicine | Admitting: Family Medicine

## 2015-09-15 ENCOUNTER — Encounter (HOSPITAL_COMMUNITY): Payer: Self-pay | Admitting: Emergency Medicine

## 2015-09-15 ENCOUNTER — Encounter (HOSPITAL_COMMUNITY): Payer: Self-pay | Admitting: *Deleted

## 2015-09-15 ENCOUNTER — Emergency Department (HOSPITAL_COMMUNITY)
Admission: EM | Admit: 2015-09-15 | Discharge: 2015-09-16 | Disposition: A | Discharge status: Home / Self Care | Payer: Medicare Other | Attending: Emergency Medicine | Admitting: Emergency Medicine

## 2015-09-15 DIAGNOSIS — E119 Type 2 diabetes mellitus without complications: Secondary | ICD-10-CM | POA: Diagnosis not present

## 2015-09-15 DIAGNOSIS — Z794 Long term (current) use of insulin: Secondary | ICD-10-CM | POA: Insufficient documentation

## 2015-09-15 DIAGNOSIS — Z79899 Other long term (current) drug therapy: Secondary | ICD-10-CM | POA: Diagnosis not present

## 2015-09-15 DIAGNOSIS — R34 Anuria and oliguria: Secondary | ICD-10-CM | POA: Diagnosis not present

## 2015-09-15 DIAGNOSIS — N401 Enlarged prostate with lower urinary tract symptoms: Secondary | ICD-10-CM

## 2015-09-15 DIAGNOSIS — R339 Retention of urine, unspecified: Secondary | ICD-10-CM | POA: Diagnosis not present

## 2015-09-15 DIAGNOSIS — N138 Other obstructive and reflux uropathy: Secondary | ICD-10-CM

## 2015-09-15 DIAGNOSIS — Z87438 Personal history of other diseases of male genital organs: Secondary | ICD-10-CM | POA: Diagnosis not present

## 2015-09-15 LAB — POCT URINALYSIS DIP (DEVICE)
BILIRUBIN URINE: NEGATIVE
Glucose, UA: NEGATIVE mg/dL
KETONES UR: NEGATIVE mg/dL
Leukocytes, UA: NEGATIVE
Nitrite: NEGATIVE
Protein, ur: NEGATIVE mg/dL
Specific Gravity, Urine: 1.01 (ref 1.005–1.030)
Urobilinogen, UA: 0.2 mg/dL (ref 0.0–1.0)
pH: 6 (ref 5.0–8.0)

## 2015-09-15 LAB — BASIC METABOLIC PANEL
Anion gap: 8 (ref 5–15)
BUN: 8 mg/dL (ref 6–20)
CALCIUM: 9.1 mg/dL (ref 8.9–10.3)
CHLORIDE: 107 mmol/L (ref 101–111)
CO2: 23 mmol/L (ref 22–32)
CREATININE: 0.82 mg/dL (ref 0.61–1.24)
GFR calc Af Amer: >60 mL/min (ref >60)
GFR calc non Af Amer: >60 mL/min (ref >60)
Glucose, Bld: 90 mg/dL (ref 65–99)
Potassium: 3.2 mmol/L — ABNORMAL LOW (ref 3.5–5.1)
SODIUM: 138 mmol/L (ref 135–145)

## 2015-09-15 LAB — URINE MICROSCOPIC-ADD ON

## 2015-09-15 LAB — URINALYSIS, ROUTINE W REFLEX MICROSCOPIC
BILIRUBIN URINE: NEGATIVE
Glucose, UA: NEGATIVE mg/dL
KETONES UR: NEGATIVE mg/dL
LEUKOCYTES UA: NEGATIVE
NITRITE: NEGATIVE
PH: 5 (ref 5.0–8.0)
Protein, ur: NEGATIVE mg/dL
SPECIFIC GRAVITY, URINE: 1.011 (ref 1.005–1.030)
Urobilinogen, UA: 0.2 mg/dL (ref 0.0–1.0)

## 2015-09-15 MED ORDER — HYDROCODONE-ACETAMINOPHEN 5-325 MG PO TABS
2.0000 | ORAL_TABLET | Freq: Once | ORAL | Status: AC
  Administered 2015-09-15: 2 via ORAL
  Filled 2015-09-15: qty 2

## 2015-09-15 NOTE — ED Provider Notes (Signed)
CSN: [id number]     Arrival date & time 09/15/15  1846 History   First MD Initiated Contact with Patient 09/15/15 1906     Chief Complaint  Patient presents with  . Urinary Retention     (Consider location/radiation/quality/duration/timing/severity/associated sxs/prior Treatment) HPI Comments: Pt is a 69 yo male with PMH of DM and BPH who presents to the ED with complaint of urinary retention, onset 2 days. Pt reports he has had difficulty urinating. He notes having urinary dribbling when he tries to urinate and reports he feels like he has to go every few minutes. Endorses pressure to his lower abdomen. Denies fever, chills, abdominal pain, flank pain, N/V/D, constipation, dysuria, hematuria, penile d/c, penile/testicular pain or swelling. Pt was seen at [facility] pta and was sent to the ED. He reports no pain during prostate exam performed at [facility]. Pt states he sees a Insurance underwriter at the [place] and was last seen 3 months ago. Denies history of prior episodes of retention.    Past Medical History  Diagnosis Date  . Diabetes mellitus without complication   . BPH (benign prostatic hyperplasia)    History reviewed. No pertinent past surgical history. History reviewed. No pertinent family history. Social History  Substance Use Topics  . Smoking status: Never Smoker   . Smokeless tobacco: None  . Alcohol Use: No    Review of Systems  Gastrointestinal: Positive for abdominal pain.  Genitourinary: Positive for decreased urine volume (retention).  All other systems reviewed and are negative.     Allergies  Review of patient's allergies indicates no known allergies.  Home Medications   Prior to Admission medications   Medication Sig Start Date End Date Taking? Authorizing Provider  atorvastatin (LIPITOR) 80 MG tablet Take 40 mg by mouth daily.    Historical Provider, MD  finasteride (PROSCAR) 5 MG tablet Take 5 mg by mouth at bedtime.    Historical Provider, MD  glipiZIDE (GLUCOTROL) 10 MG tablet  Take 20 mg by mouth 2 (two) times daily before a meal.    Historical Provider, MD  insulin glargine (LANTUS) 100 UNIT/ML injection Inject 15 Units into the skin at bedtime.    Historical Provider, MD  losartan (COZAAR) 50 MG tablet Take 50 mg by mouth daily.    Historical Provider, MD  metFORMIN (GLUCOPHAGE) 1000 MG tablet Take 1,000 mg by mouth 2 (two) times daily with a meal.    Historical Provider, MD  prazosin (MINIPRESS) 2 MG capsule Take 4 mg by mouth at bedtime.    Historical Provider, MD   BP 164/74 mmHg  Pulse 101  Temp(Src) 97.9 F (36.6 C) (Oral)  Resp 18  SpO2 99% Physical Exam  Constitutional: He is oriented to person, place, and time. He appears well-developed and well-nourished. No distress.  HENT:  Head: Normocephalic and atraumatic.  Eyes: Conjunctivae and EOM are normal. Right eye exhibits no discharge. Left eye exhibits no discharge. No scleral icterus.  Neck: Normal range of motion. Neck supple.  Cardiovascular: Normal rate, regular rhythm, normal heart sounds and intact distal pulses.   Pulmonary/Chest: Effort normal and breath sounds normal. He has no wheezes. He has no rales. He exhibits no tenderness.  Abdominal: Soft. Bowel sounds are normal. He exhibits no mass. There is no tenderness. There is no rebound and no guarding.  Protuberant abdomen  Musculoskeletal: He exhibits no edema.  Neurological: He is alert and oriented to person, place, and time.  Skin: Skin is warm and dry.  Nursing note and  vitals reviewed.   ED Course  Procedures (including critical care time) Labs Review Labs Reviewed  URINALYSIS, ROUTINE W REFLEX MICROSCOPIC (NOT AT [facility])  BASIC METABOLIC PANEL    Imaging Review No results found. I have personally reviewed and evaluated these images and lab results as part of my medical decision-making.   EKG Interpretation None      MDM   Final diagnoses:  None    Pt presents with urinary retention x2 days. He reports to only having  a few drops of urine each time he tries to urinate. Denies fever, dysuria, penile/testicular pain or swelling. Endorses history of BPH, sees urologist at the [place]. Prostate exam done at [facility] revealed large prostate, no tenderness reported by pt. VSS. Protuberant abdomen on exam, nontender. Catheter placed and urine sample obtained. BMP ordered to evaluate kidney function due to 2 days of urinary retention. Pt reports he is planing to see his urolgoist tomorrow.   Hand-off to [employer], PA-C. Pending UA and BMP.   Satira Sark [place], [place] 09/15/15 2034  Linwood Dibbles, MD 09/15/15 [phone]

## 2015-09-15 NOTE — ED Provider Notes (Signed)
8:31 PM Patient signed out to me by Melburn Hake, PA-C. Patient pending urinalysis and BMP. Patient has Urology follow up tomorrow. Patient will likely be discharged.   12:19 AM Labs and urinalysis unremarkable for acute changes. Patient discharged with Urology follow up.   Results for orders placed or performed during the hospital encounter of 09/15/15  Urinalysis, Routine w reflex microscopic  Result Value Ref Range   Color, Urine YELLOW YELLOW   APPearance CLEAR CLEAR   Specific Gravity, Urine 1.011 1.005 - 1.030   pH 5.0 5.0 - 8.0   Glucose, UA NEGATIVE NEGATIVE mg/dL   Hgb urine dipstick TRACE (A) NEGATIVE   Bilirubin Urine NEGATIVE NEGATIVE   Ketones, ur NEGATIVE NEGATIVE mg/dL   Protein, ur NEGATIVE NEGATIVE mg/dL   Urobilinogen, UA 0.2 0.0 - 1.0 mg/dL   Nitrite NEGATIVE NEGATIVE   Leukocytes, UA NEGATIVE NEGATIVE  Basic metabolic panel  Result Value Ref Range   Sodium 138 135 - 145 mmol/L   Potassium 3.2 (L) 3.5 - 5.1 mmol/L   Chloride 107 101 - 111 mmol/L   CO2 23 22 - 32 mmol/L   Glucose, Bld 90 65 - 99 mg/dL   BUN 8 6 - 20 mg/dL   Creatinine, Ser 1.61 0.61 - 1.24 mg/dL   Calcium 9.1 8.9 - 09.6 mg/dL   GFR calc non Af Amer >60 >60 mL/min   GFR calc Af Amer >60 >60 mL/min   Anion gap 8 5 - 15  Urine microscopic-add on  Result Value Ref Range   Squamous Epithelial / LPF FEW (A) RARE   WBC, UA 0-2 <3 WBC/hpf   RBC / HPF 0-2 <3 RBC/hpf   Bacteria, UA RARE RARE   No results found.    [address] [place], PA-C 09/16/15 0019  Linwood Dibbles, MD 09/16/15 [phone]

## 2015-09-15 NOTE — ED Notes (Signed)
PA at bedside.

## 2015-09-15 NOTE — ED Notes (Signed)
Pt being transferred to ED via shuttle for urinary retention.  Report was called to the ED Charge RN, [place].

## 2015-09-15 NOTE — ED Notes (Signed)
Pt has been suffering from urinary urgency, but unable to go.  Pt states he is undergoing testing for an enlarged prostate.

## 2015-09-15 NOTE — ED Notes (Signed)
Pt in c/o urinary retention over the last two days, worse today. C/o pain to bladder area, states he is unable to get relief, sent from urgent care for evaluation

## 2015-09-15 NOTE — ED Provider Notes (Signed)
CSN: [id number]     Arrival date & time 09/15/15  1707 History   First MD Initiated Contact with Patient 09/15/15 1745     Chief Complaint  Patient presents with  . Urinary Retention  . Urinary Urgency   (Consider location/radiation/quality/duration/timing/severity/associated sxs/prior Treatment) Patient is a 69 y.o. male presenting with frequency. The history is provided by the patient.  Urinary Frequency This is a new problem. The current episode started 2 days ago. The problem has been gradually worsening. Pertinent negatives include no chest pain and no abdominal pain.    Past Medical History  Diagnosis Date  . Diabetes mellitus without complication   . BPH (benign prostatic hyperplasia)    History reviewed. No pertinent past surgical history. History reviewed. No pertinent family history. Social History  Substance Use Topics  . Smoking status: Never Smoker   . Smokeless tobacco: None  . Alcohol Use: No    Review of Systems  Constitutional: Negative.   Cardiovascular: Negative for chest pain.  Gastrointestinal: Negative for abdominal pain.  Genitourinary: Positive for frequency and difficulty urinating. Negative for hematuria, flank pain, discharge, penile swelling, penile pain and testicular pain.  All other systems reviewed and are negative.   Allergies  Review of patient's allergies indicates no known allergies.  Home Medications   Prior to Admission medications   Medication Sig Start Date End Date Taking? Authorizing Provider  atorvastatin (LIPITOR) 80 MG tablet Take 40 mg by mouth daily.   Yes Historical Provider, MD  finasteride (PROSCAR) 5 MG tablet Take 5 mg by mouth at bedtime.   Yes Historical Provider, MD  glipiZIDE (GLUCOTROL) 10 MG tablet Take 20 mg by mouth 2 (two) times daily before a meal.   Yes Historical Provider, MD  insulin glargine (LANTUS) 100 UNIT/ML injection Inject 15 Units into the skin at bedtime.   Yes Historical Provider, MD  losartan  (COZAAR) 50 MG tablet Take 50 mg by mouth daily.   Yes Historical Provider, MD  metFORMIN (GLUCOPHAGE) 1000 MG tablet Take 1,000 mg by mouth 2 (two) times daily with a meal.   Yes Historical Provider, MD  prazosin (MINIPRESS) 2 MG capsule Take 4 mg by mouth at bedtime.   Yes Historical Provider, MD   Meds Ordered and Administered this Visit  Medications - No data to display  BP 161/77 mmHg  Pulse 101  Temp(Src) 98.2 F (36.8 C) (Oral)  Resp 16  SpO2 99% No data found.   Physical Exam  Genitourinary: Rectum normal and penis normal. Prostate is enlarged and tender. Circumcised.    ED Course  Procedures (including critical care time)  Labs Review Labs Reviewed  POCT URINALYSIS DIP (DEVICE) - Abnormal; Notable for the following:    Hgb urine dipstick TRACE (*)    All other components within normal limits    Imaging Review No results found.   Visual Acuity Review  Right Eye Distance:   Left Eye Distance:   Bilateral Distance:    Right Eye Near:   Left Eye Near:    Bilateral Near:         MDM   1. BPH with urinary obstruction    Discussed with dr Berneice Heinrich, rec eval for catheterization, will see if needed.    Linna Hoff, MD 09/15/15 [phone]

## 2015-09-22 DIAGNOSIS — R351 Nocturia: Secondary | ICD-10-CM | POA: Diagnosis not present

## 2015-09-22 DIAGNOSIS — N138 Other obstructive and reflux uropathy: Secondary | ICD-10-CM | POA: Diagnosis not present

## 2015-09-22 DIAGNOSIS — N401 Enlarged prostate with lower urinary tract symptoms: Secondary | ICD-10-CM | POA: Diagnosis not present

## 2015-09-22 DIAGNOSIS — R339 Retention of urine, unspecified: Secondary | ICD-10-CM | POA: Diagnosis not present

## 2015-09-22 DIAGNOSIS — R3912 Poor urinary stream: Secondary | ICD-10-CM | POA: Diagnosis not present

## 2015-10-10 DIAGNOSIS — R3914 Feeling of incomplete bladder emptying: Secondary | ICD-10-CM | POA: Diagnosis not present

## 2015-10-10 DIAGNOSIS — R3912 Poor urinary stream: Secondary | ICD-10-CM | POA: Diagnosis not present

## 2016-02-13 DIAGNOSIS — M25562 Pain in left knee: Secondary | ICD-10-CM | POA: Diagnosis not present

## 2016-02-13 DIAGNOSIS — M542 Cervicalgia: Secondary | ICD-10-CM | POA: Diagnosis not present

## 2016-02-13 DIAGNOSIS — M791 Myalgia: Secondary | ICD-10-CM | POA: Diagnosis not present

## 2016-02-13 DIAGNOSIS — M545 Low back pain: Secondary | ICD-10-CM | POA: Diagnosis not present

## 2016-02-13 DIAGNOSIS — M25561 Pain in right knee: Secondary | ICD-10-CM | POA: Diagnosis not present

## 2016-02-20 DIAGNOSIS — M461 Sacroiliitis, not elsewhere classified: Secondary | ICD-10-CM | POA: Diagnosis not present

## 2016-02-20 DIAGNOSIS — M25562 Pain in left knee: Secondary | ICD-10-CM | POA: Diagnosis not present

## 2016-02-20 DIAGNOSIS — M5386 Other specified dorsopathies, lumbar region: Secondary | ICD-10-CM | POA: Diagnosis not present

## 2016-02-20 DIAGNOSIS — M9902 Segmental and somatic dysfunction of thoracic region: Secondary | ICD-10-CM | POA: Diagnosis not present

## 2016-02-20 DIAGNOSIS — M4606 Spinal enthesopathy, lumbar region: Secondary | ICD-10-CM | POA: Diagnosis not present

## 2016-02-20 DIAGNOSIS — M9905 Segmental and somatic dysfunction of pelvic region: Secondary | ICD-10-CM | POA: Diagnosis not present

## 2016-02-20 DIAGNOSIS — M1712 Unilateral primary osteoarthritis, left knee: Secondary | ICD-10-CM | POA: Diagnosis not present

## 2016-02-20 DIAGNOSIS — M545 Low back pain: Secondary | ICD-10-CM | POA: Diagnosis not present

## 2016-02-21 DIAGNOSIS — M9905 Segmental and somatic dysfunction of pelvic region: Secondary | ICD-10-CM | POA: Diagnosis not present

## 2016-02-21 DIAGNOSIS — M9902 Segmental and somatic dysfunction of thoracic region: Secondary | ICD-10-CM | POA: Diagnosis not present

## 2016-02-23 DIAGNOSIS — M461 Sacroiliitis, not elsewhere classified: Secondary | ICD-10-CM | POA: Diagnosis not present

## 2016-02-23 DIAGNOSIS — M5386 Other specified dorsopathies, lumbar region: Secondary | ICD-10-CM | POA: Diagnosis not present

## 2016-02-23 DIAGNOSIS — M9905 Segmental and somatic dysfunction of pelvic region: Secondary | ICD-10-CM | POA: Diagnosis not present

## 2016-02-23 DIAGNOSIS — M25562 Pain in left knee: Secondary | ICD-10-CM | POA: Diagnosis not present

## 2016-02-23 DIAGNOSIS — M4606 Spinal enthesopathy, lumbar region: Secondary | ICD-10-CM | POA: Diagnosis not present

## 2016-02-23 DIAGNOSIS — M1712 Unilateral primary osteoarthritis, left knee: Secondary | ICD-10-CM | POA: Diagnosis not present

## 2016-02-23 DIAGNOSIS — M9902 Segmental and somatic dysfunction of thoracic region: Secondary | ICD-10-CM | POA: Diagnosis not present

## 2016-02-23 DIAGNOSIS — M545 Low back pain: Secondary | ICD-10-CM | POA: Diagnosis not present

## 2016-02-28 DIAGNOSIS — M545 Low back pain: Secondary | ICD-10-CM | POA: Diagnosis not present

## 2016-02-28 DIAGNOSIS — M9902 Segmental and somatic dysfunction of thoracic region: Secondary | ICD-10-CM | POA: Diagnosis not present

## 2016-02-28 DIAGNOSIS — M5386 Other specified dorsopathies, lumbar region: Secondary | ICD-10-CM | POA: Diagnosis not present

## 2016-02-28 DIAGNOSIS — M25562 Pain in left knee: Secondary | ICD-10-CM | POA: Diagnosis not present

## 2016-02-28 DIAGNOSIS — M1712 Unilateral primary osteoarthritis, left knee: Secondary | ICD-10-CM | POA: Diagnosis not present

## 2016-02-28 DIAGNOSIS — M9905 Segmental and somatic dysfunction of pelvic region: Secondary | ICD-10-CM | POA: Diagnosis not present

## 2016-02-28 DIAGNOSIS — M461 Sacroiliitis, not elsewhere classified: Secondary | ICD-10-CM | POA: Diagnosis not present

## 2016-02-28 DIAGNOSIS — M4606 Spinal enthesopathy, lumbar region: Secondary | ICD-10-CM | POA: Diagnosis not present

## 2016-03-01 DIAGNOSIS — M1712 Unilateral primary osteoarthritis, left knee: Secondary | ICD-10-CM | POA: Diagnosis not present

## 2016-03-01 DIAGNOSIS — M25562 Pain in left knee: Secondary | ICD-10-CM | POA: Diagnosis not present

## 2016-03-01 DIAGNOSIS — M461 Sacroiliitis, not elsewhere classified: Secondary | ICD-10-CM | POA: Diagnosis not present

## 2016-03-01 DIAGNOSIS — M5386 Other specified dorsopathies, lumbar region: Secondary | ICD-10-CM | POA: Diagnosis not present

## 2016-03-01 DIAGNOSIS — M545 Low back pain: Secondary | ICD-10-CM | POA: Diagnosis not present

## 2016-03-01 DIAGNOSIS — M4606 Spinal enthesopathy, lumbar region: Secondary | ICD-10-CM | POA: Diagnosis not present

## 2016-03-02 DIAGNOSIS — M1712 Unilateral primary osteoarthritis, left knee: Secondary | ICD-10-CM | POA: Diagnosis not present

## 2016-03-02 DIAGNOSIS — M5386 Other specified dorsopathies, lumbar region: Secondary | ICD-10-CM | POA: Diagnosis not present

## 2016-03-02 DIAGNOSIS — M461 Sacroiliitis, not elsewhere classified: Secondary | ICD-10-CM | POA: Diagnosis not present

## 2016-03-02 DIAGNOSIS — M9902 Segmental and somatic dysfunction of thoracic region: Secondary | ICD-10-CM | POA: Diagnosis not present

## 2016-03-02 DIAGNOSIS — M545 Low back pain: Secondary | ICD-10-CM | POA: Diagnosis not present

## 2016-03-02 DIAGNOSIS — M4606 Spinal enthesopathy, lumbar region: Secondary | ICD-10-CM | POA: Diagnosis not present

## 2016-03-02 DIAGNOSIS — M25562 Pain in left knee: Secondary | ICD-10-CM | POA: Diagnosis not present

## 2016-03-02 DIAGNOSIS — M9905 Segmental and somatic dysfunction of pelvic region: Secondary | ICD-10-CM | POA: Diagnosis not present

## 2016-03-06 DIAGNOSIS — M9905 Segmental and somatic dysfunction of pelvic region: Secondary | ICD-10-CM | POA: Diagnosis not present

## 2016-03-06 DIAGNOSIS — M9902 Segmental and somatic dysfunction of thoracic region: Secondary | ICD-10-CM | POA: Diagnosis not present

## 2016-03-08 DIAGNOSIS — M545 Low back pain: Secondary | ICD-10-CM | POA: Diagnosis not present

## 2016-03-08 DIAGNOSIS — M25562 Pain in left knee: Secondary | ICD-10-CM | POA: Diagnosis not present

## 2016-03-08 DIAGNOSIS — M1712 Unilateral primary osteoarthritis, left knee: Secondary | ICD-10-CM | POA: Diagnosis not present

## 2016-03-08 DIAGNOSIS — M5386 Other specified dorsopathies, lumbar region: Secondary | ICD-10-CM | POA: Diagnosis not present

## 2016-03-08 DIAGNOSIS — M5137 Other intervertebral disc degeneration, lumbosacral region: Secondary | ICD-10-CM | POA: Diagnosis not present

## 2016-03-08 DIAGNOSIS — M4606 Spinal enthesopathy, lumbar region: Secondary | ICD-10-CM | POA: Diagnosis not present

## 2016-03-08 DIAGNOSIS — M9902 Segmental and somatic dysfunction of thoracic region: Secondary | ICD-10-CM | POA: Diagnosis not present

## 2016-03-08 DIAGNOSIS — M9905 Segmental and somatic dysfunction of pelvic region: Secondary | ICD-10-CM | POA: Diagnosis not present

## 2016-03-08 DIAGNOSIS — M5136 Other intervertebral disc degeneration, lumbar region: Secondary | ICD-10-CM | POA: Diagnosis not present

## 2016-03-09 DIAGNOSIS — M4606 Spinal enthesopathy, lumbar region: Secondary | ICD-10-CM | POA: Diagnosis not present

## 2016-03-09 DIAGNOSIS — M545 Low back pain: Secondary | ICD-10-CM | POA: Diagnosis not present

## 2016-03-09 DIAGNOSIS — M5386 Other specified dorsopathies, lumbar region: Secondary | ICD-10-CM | POA: Diagnosis not present

## 2016-03-09 DIAGNOSIS — M25562 Pain in left knee: Secondary | ICD-10-CM | POA: Diagnosis not present

## 2016-03-09 DIAGNOSIS — M5136 Other intervertebral disc degeneration, lumbar region: Secondary | ICD-10-CM | POA: Diagnosis not present

## 2016-03-09 DIAGNOSIS — M5137 Other intervertebral disc degeneration, lumbosacral region: Secondary | ICD-10-CM | POA: Diagnosis not present

## 2016-03-09 DIAGNOSIS — M9902 Segmental and somatic dysfunction of thoracic region: Secondary | ICD-10-CM | POA: Diagnosis not present

## 2016-03-09 DIAGNOSIS — M1712 Unilateral primary osteoarthritis, left knee: Secondary | ICD-10-CM | POA: Diagnosis not present

## 2016-03-09 DIAGNOSIS — M9905 Segmental and somatic dysfunction of pelvic region: Secondary | ICD-10-CM | POA: Diagnosis not present

## 2016-03-12 DIAGNOSIS — M1712 Unilateral primary osteoarthritis, left knee: Secondary | ICD-10-CM | POA: Diagnosis not present

## 2016-03-12 DIAGNOSIS — M5137 Other intervertebral disc degeneration, lumbosacral region: Secondary | ICD-10-CM | POA: Diagnosis not present

## 2016-03-12 DIAGNOSIS — M9905 Segmental and somatic dysfunction of pelvic region: Secondary | ICD-10-CM | POA: Diagnosis not present

## 2016-03-12 DIAGNOSIS — M5386 Other specified dorsopathies, lumbar region: Secondary | ICD-10-CM | POA: Diagnosis not present

## 2016-03-12 DIAGNOSIS — M545 Low back pain: Secondary | ICD-10-CM | POA: Diagnosis not present

## 2016-03-12 DIAGNOSIS — M4606 Spinal enthesopathy, lumbar region: Secondary | ICD-10-CM | POA: Diagnosis not present

## 2016-03-12 DIAGNOSIS — M9902 Segmental and somatic dysfunction of thoracic region: Secondary | ICD-10-CM | POA: Diagnosis not present

## 2016-03-12 DIAGNOSIS — M25562 Pain in left knee: Secondary | ICD-10-CM | POA: Diagnosis not present

## 2016-03-12 DIAGNOSIS — M5136 Other intervertebral disc degeneration, lumbar region: Secondary | ICD-10-CM | POA: Diagnosis not present

## 2016-03-15 DIAGNOSIS — M9902 Segmental and somatic dysfunction of thoracic region: Secondary | ICD-10-CM | POA: Diagnosis not present

## 2016-03-15 DIAGNOSIS — M4606 Spinal enthesopathy, lumbar region: Secondary | ICD-10-CM | POA: Diagnosis not present

## 2016-03-15 DIAGNOSIS — M9905 Segmental and somatic dysfunction of pelvic region: Secondary | ICD-10-CM | POA: Diagnosis not present

## 2016-03-15 DIAGNOSIS — M5386 Other specified dorsopathies, lumbar region: Secondary | ICD-10-CM | POA: Diagnosis not present

## 2016-03-19 DIAGNOSIS — M5136 Other intervertebral disc degeneration, lumbar region: Secondary | ICD-10-CM | POA: Diagnosis not present

## 2016-03-19 DIAGNOSIS — M545 Low back pain: Secondary | ICD-10-CM | POA: Diagnosis not present

## 2016-03-19 DIAGNOSIS — M25562 Pain in left knee: Secondary | ICD-10-CM | POA: Diagnosis not present

## 2016-03-19 DIAGNOSIS — M9905 Segmental and somatic dysfunction of pelvic region: Secondary | ICD-10-CM | POA: Diagnosis not present

## 2016-03-19 DIAGNOSIS — M9902 Segmental and somatic dysfunction of thoracic region: Secondary | ICD-10-CM | POA: Diagnosis not present

## 2016-03-19 DIAGNOSIS — M1712 Unilateral primary osteoarthritis, left knee: Secondary | ICD-10-CM | POA: Diagnosis not present

## 2016-03-19 DIAGNOSIS — M4606 Spinal enthesopathy, lumbar region: Secondary | ICD-10-CM | POA: Diagnosis not present

## 2016-03-19 DIAGNOSIS — M5137 Other intervertebral disc degeneration, lumbosacral region: Secondary | ICD-10-CM | POA: Diagnosis not present

## 2016-03-19 DIAGNOSIS — M5386 Other specified dorsopathies, lumbar region: Secondary | ICD-10-CM | POA: Diagnosis not present

## 2016-03-20 DIAGNOSIS — M9905 Segmental and somatic dysfunction of pelvic region: Secondary | ICD-10-CM | POA: Diagnosis not present

## 2016-03-20 DIAGNOSIS — M9902 Segmental and somatic dysfunction of thoracic region: Secondary | ICD-10-CM | POA: Diagnosis not present

## 2016-03-22 DIAGNOSIS — M5386 Other specified dorsopathies, lumbar region: Secondary | ICD-10-CM | POA: Diagnosis not present

## 2016-03-22 DIAGNOSIS — M9905 Segmental and somatic dysfunction of pelvic region: Secondary | ICD-10-CM | POA: Diagnosis not present

## 2016-03-22 DIAGNOSIS — M4606 Spinal enthesopathy, lumbar region: Secondary | ICD-10-CM | POA: Diagnosis not present

## 2016-03-22 DIAGNOSIS — M9902 Segmental and somatic dysfunction of thoracic region: Secondary | ICD-10-CM | POA: Diagnosis not present

## 2016-03-26 DIAGNOSIS — M5386 Other specified dorsopathies, lumbar region: Secondary | ICD-10-CM | POA: Diagnosis not present

## 2016-03-26 DIAGNOSIS — M5137 Other intervertebral disc degeneration, lumbosacral region: Secondary | ICD-10-CM | POA: Diagnosis not present

## 2016-03-26 DIAGNOSIS — M9905 Segmental and somatic dysfunction of pelvic region: Secondary | ICD-10-CM | POA: Diagnosis not present

## 2016-03-26 DIAGNOSIS — M9902 Segmental and somatic dysfunction of thoracic region: Secondary | ICD-10-CM | POA: Diagnosis not present

## 2016-03-26 DIAGNOSIS — M545 Low back pain: Secondary | ICD-10-CM | POA: Diagnosis not present

## 2016-03-26 DIAGNOSIS — M4606 Spinal enthesopathy, lumbar region: Secondary | ICD-10-CM | POA: Diagnosis not present

## 2016-03-26 DIAGNOSIS — M1712 Unilateral primary osteoarthritis, left knee: Secondary | ICD-10-CM | POA: Diagnosis not present

## 2016-03-26 DIAGNOSIS — M25562 Pain in left knee: Secondary | ICD-10-CM | POA: Diagnosis not present

## 2016-03-26 DIAGNOSIS — M5136 Other intervertebral disc degeneration, lumbar region: Secondary | ICD-10-CM | POA: Diagnosis not present

## 2016-03-27 DIAGNOSIS — M9902 Segmental and somatic dysfunction of thoracic region: Secondary | ICD-10-CM | POA: Diagnosis not present

## 2016-03-27 DIAGNOSIS — M9905 Segmental and somatic dysfunction of pelvic region: Secondary | ICD-10-CM | POA: Diagnosis not present

## 2016-03-29 DIAGNOSIS — M9905 Segmental and somatic dysfunction of pelvic region: Secondary | ICD-10-CM | POA: Diagnosis not present

## 2016-03-29 DIAGNOSIS — M9902 Segmental and somatic dysfunction of thoracic region: Secondary | ICD-10-CM | POA: Diagnosis not present

## 2016-04-02 DIAGNOSIS — M9902 Segmental and somatic dysfunction of thoracic region: Secondary | ICD-10-CM | POA: Diagnosis not present

## 2016-04-02 DIAGNOSIS — M9905 Segmental and somatic dysfunction of pelvic region: Secondary | ICD-10-CM | POA: Diagnosis not present

## 2016-04-03 DIAGNOSIS — M9902 Segmental and somatic dysfunction of thoracic region: Secondary | ICD-10-CM | POA: Diagnosis not present

## 2016-04-03 DIAGNOSIS — M9905 Segmental and somatic dysfunction of pelvic region: Secondary | ICD-10-CM | POA: Diagnosis not present

## 2016-04-05 DIAGNOSIS — M9905 Segmental and somatic dysfunction of pelvic region: Secondary | ICD-10-CM | POA: Diagnosis not present

## 2016-04-05 DIAGNOSIS — M9902 Segmental and somatic dysfunction of thoracic region: Secondary | ICD-10-CM | POA: Diagnosis not present

## 2016-04-09 DIAGNOSIS — M9905 Segmental and somatic dysfunction of pelvic region: Secondary | ICD-10-CM | POA: Diagnosis not present

## 2016-04-09 DIAGNOSIS — M9902 Segmental and somatic dysfunction of thoracic region: Secondary | ICD-10-CM | POA: Diagnosis not present

## 2016-04-10 DIAGNOSIS — M9902 Segmental and somatic dysfunction of thoracic region: Secondary | ICD-10-CM | POA: Diagnosis not present

## 2016-04-10 DIAGNOSIS — M9905 Segmental and somatic dysfunction of pelvic region: Secondary | ICD-10-CM | POA: Diagnosis not present

## 2016-04-17 DIAGNOSIS — M9902 Segmental and somatic dysfunction of thoracic region: Secondary | ICD-10-CM | POA: Diagnosis not present

## 2016-04-17 DIAGNOSIS — M9905 Segmental and somatic dysfunction of pelvic region: Secondary | ICD-10-CM | POA: Diagnosis not present

## 2016-04-19 DIAGNOSIS — M9902 Segmental and somatic dysfunction of thoracic region: Secondary | ICD-10-CM | POA: Diagnosis not present

## 2016-04-19 DIAGNOSIS — M9905 Segmental and somatic dysfunction of pelvic region: Secondary | ICD-10-CM | POA: Diagnosis not present

## 2016-04-26 DIAGNOSIS — M9902 Segmental and somatic dysfunction of thoracic region: Secondary | ICD-10-CM | POA: Diagnosis not present

## 2016-04-26 DIAGNOSIS — M9905 Segmental and somatic dysfunction of pelvic region: Secondary | ICD-10-CM | POA: Diagnosis not present

## 2017-07-13 ENCOUNTER — Inpatient Hospital Stay (HOSPITAL_COMMUNITY)
Admission: EM | Admit: 2017-07-13 | Discharge: 2017-07-13 | DRG: 552 | Disposition: A | Discharge status: Home / Self Care | Payer: Medicare Other | Attending: Emergency Medicine | Admitting: Emergency Medicine

## 2017-07-13 ENCOUNTER — Emergency Department (HOSPITAL_COMMUNITY): Payer: Medicare Other

## 2017-07-13 ENCOUNTER — Encounter (HOSPITAL_COMMUNITY): Payer: Self-pay | Admitting: Emergency Medicine

## 2017-07-13 DIAGNOSIS — S50312A Abrasion of left elbow, initial encounter: Secondary | ICD-10-CM | POA: Diagnosis not present

## 2017-07-13 DIAGNOSIS — N400 Enlarged prostate without lower urinary tract symptoms: Secondary | ICD-10-CM | POA: Diagnosis not present

## 2017-07-13 DIAGNOSIS — Z794 Long term (current) use of insulin: Secondary | ICD-10-CM

## 2017-07-13 DIAGNOSIS — E119 Type 2 diabetes mellitus without complications: Secondary | ICD-10-CM | POA: Diagnosis present

## 2017-07-13 DIAGNOSIS — T148XXA Other injury of unspecified body region, initial encounter: Secondary | ICD-10-CM | POA: Diagnosis not present

## 2017-07-13 DIAGNOSIS — M542 Cervicalgia: Secondary | ICD-10-CM | POA: Diagnosis not present

## 2017-07-13 DIAGNOSIS — K56609 Unspecified intestinal obstruction, unspecified as to partial versus complete obstruction: Secondary | ICD-10-CM | POA: Diagnosis present

## 2017-07-13 DIAGNOSIS — S161XXA Strain of muscle, fascia and tendon at neck level, initial encounter: Secondary | ICD-10-CM | POA: Diagnosis not present

## 2017-07-13 DIAGNOSIS — Y92410 Unspecified street and highway as the place of occurrence of the external cause: Secondary | ICD-10-CM

## 2017-07-13 DIAGNOSIS — R0789 Other chest pain: Secondary | ICD-10-CM | POA: Diagnosis present

## 2017-07-13 DIAGNOSIS — M25552 Pain in left hip: Secondary | ICD-10-CM

## 2017-07-13 DIAGNOSIS — I10 Essential (primary) hypertension: Secondary | ICD-10-CM | POA: Diagnosis present

## 2017-07-13 MED ORDER — METHOCARBAMOL 500 MG PO TABS: 500.0000 mg | ORAL_TABLET | Freq: Every evening | ORAL | 0 refills | Status: AC | PRN

## 2017-07-13 MED ORDER — ACETAMINOPHEN 325 MG PO TABS
650.0000 mg | ORAL_TABLET | Freq: Once | ORAL | Status: AC
  Administered 2017-07-13: 650 mg via ORAL
  Filled 2017-07-13: qty 2

## 2017-07-13 NOTE — ED Notes (Signed)
Pt in imaging

## 2017-07-13 NOTE — Discharge Instructions (Signed)
Take Ibuprofen three times daily for the next week. Take this medicine with food. °Take muscle relaxer at bedtime to help you sleep. This medicine makes you drowsy so do not take before driving or work °Use a heating pad for sore muscles - use for 20 minutes several times a day °Return for worsening symptoms ° °

## 2017-07-13 NOTE — ED Notes (Signed)
Family at bedside. 
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
## 2018-09-22 ENCOUNTER — Ambulatory Visit: Payer: No Typology Code available for payment source | Attending: Internal Medicine | Admitting: Physical Therapy

## 2018-09-22 ENCOUNTER — Other Ambulatory Visit: Payer: Self-pay

## 2018-09-22 ENCOUNTER — Encounter: Payer: Self-pay | Admitting: Physical Therapy

## 2018-09-22 DIAGNOSIS — M545 Low back pain: Secondary | ICD-10-CM | POA: Diagnosis present

## 2018-09-22 DIAGNOSIS — G8929 Other chronic pain: Secondary | ICD-10-CM | POA: Diagnosis present

## 2018-09-22 DIAGNOSIS — M6281 Muscle weakness (generalized): Secondary | ICD-10-CM | POA: Insufficient documentation

## 2018-09-22 DIAGNOSIS — R2689 Other abnormalities of gait and mobility: Secondary | ICD-10-CM | POA: Diagnosis not present

## 2018-09-22 DIAGNOSIS — R2681 Unsteadiness on feet: Secondary | ICD-10-CM | POA: Diagnosis not present

## 2018-09-22 NOTE — Therapy (Signed)
[facility] [address] [place], [place], [address] Phone: [phone]   Fax:  [phone]  Physical Therapy Evaluation  Patient Details  Name: Ethan Johnson MRN: [id number] Date of Birth: 08-26-1946 Referring Provider (PT): Dr. Sondra Come   Encounter Date: 09/22/2018  PT End of Session - 09/22/18 2042    Visit Number  1    Number of Visits  15   [facility] authorized 15 visits   Date for PT Re-Evaluation  11/22/18    Authorization Type  [facility]    Authorization Time Period  09-22-18 - 01-22-19    Authorization - Visit Number  1    Authorization - Number of Visits  15    PT Start Time  0934    PT Stop Time  1020    PT Time Calculation (min)  46 min       Past Medical History:  Diagnosis Date  . BPH (benign prostatic hyperplasia)   . Diabetes mellitus without complication (HCC)     History reviewed. No pertinent surgical history.  There were no vitals filed for this visit.   Subjective Assessment - 09/22/18 2031    Subjective  Pt presents to PT eval amb. with use of 4 wheeled walker - states he has used this device for approx. past couple of years; pt received PT at this clinic in April 2013    Pertinent History  DM with neuropathy    Limitations  Walking    How long can you stand comfortably?  approx. 10"     Patient Stated Goals  get rid of some of the pain in my left leg and be able to walk again without the walker         [facility] PT Assessment - 09/22/18 0944      Assessment   Medical Diagnosis  Diabetic Neuropathy:  muscle weakness    Referring Provider (PT)  Dr. Sondra Come    Onset Date/Surgical Date  --   approx. 2017 for decline in mobility   Prior Therapy  pt received PT at this clinic in April 2013      Precautions   Precautions  Fall      Restrictions   Weight Bearing Restrictions  No      Balance Screen   Has the patient fallen in the past 6 months  Yes    How many times?  4-5   most  recent fall sustained last week   Has the patient had a decrease in activity level because of a fear of falling?   Yes    Is the patient reluctant to leave their home because of a fear of falling?   No      Home Environment   Living Environment  Private residence    Type of Home  House    Home Access  Stairs to enter    Entrance Stairs-Number of Steps  2    Entrance Stairs-Rails  Can reach both   has ramp   Home Layout  Two level      Prior Function   Level of Independence  Independent with household mobility with device;Independent with basic ADLs;Independent with community mobility with device;Needs assistance with homemaking;Independent with transfers      ROM / Strength   AROM / PROM / Strength  Strength      Strength   Overall Strength  Within functional limits for tasks performed   RLE   Overall Strength Comments  c/o pain with resistance with MMT of LLE       Transfers   Transfers  Sit to Stand    Comments  bil. UE support needed to stand from mat table      Ambulation/Gait   Ambulation/Gait  Yes    Ambulation/Gait Assistance  5: Supervision    Ambulation/Gait Assistance Details  increased UE weight bearing due to c/o LLE pain    Ambulation Distance (Feet)  100 Feet    Assistive device  4-wheeled walker    Gait Pattern  Decreased dorsiflexion - right;Antalgic;Decreased weight shift to left;Decreased step length - right;Decreased step length - left;Shuffle    Ambulation Surface  Level;Indoor    Gait velocity  48.97 = .67 ft/sec       Standardized Balance Assessment   Standardized Balance Assessment  Timed Up and Go Test      Timed Up and Go Test   Normal TUG (seconds)  68   1" 8 secs with RW               Objective measurements completed on examination: See above findings.                PT Short Term Goals - 09/22/18 2052      PT SHORT TERM GOAL #1   Title  Pt will perform sit to stand 5 reps with 1 UE support from mat to demo improved  LE strength.    Baseline  bil. UE support needed     Time  4    Period  Weeks    Status  New    Target Date  10/22/18      PT SHORT TERM GOAL #2   Title  Increase gait velocity from .67 ft/sec to >/= 1.0 ft/sec with RW for incr. gait efficiency.    Baseline  48.97 secs = .67 ft/sec with RW    Time  4    Period  Weeks    Status  New    Target Date  10/22/18      PT SHORT TERM GOAL #3   Title  Improve TUG score from 1" 8 secs with RW to </= 48 secs with RW to demo improved mobility.    Time  4    Period  Weeks    Status  New    Target Date  10/22/18      PT SHORT TERM GOAL #4   Title  Pt wll amb. 300' with RW on flat, even surface with SBA for increased community accessibility.    Baseline  100' with RW on 09-22-18    Time  4    Period  Weeks    Status  New    Target Date  10/22/18      PT SHORT TERM GOAL #5   Title  Independent in HEP for balance and strengthening.     Time  4    Period  Weeks    Status  New    Target Date  10/22/18        PT Long Term Goals - 09/22/18 2100      PT LONG TERM GOAL #1   Title  Perform 1 rep of sit to stand transfer from mat without UE support to demo increased LE strength.    Time  8    Period  Weeks    Status  New    Target Date  11/22/18      PT LONG TERM  GOAL #2   Title  Improve TUG score from 1" 8 secs with RW to </= 40 secs with RW to demo improved functional mobility.    Time  8    Period  Weeks    Status  New    Target Date  11/22/18      PT LONG TERM GOAL #3   Title  Pt will be independent in aquatic exercise program to continue independently upon D/C from PT, pending authorization from [place] for aquatic therapy.    Time  8    Period  Weeks    Status  New    Target Date  11/22/18      PT LONG TERM GOAL #4   Title  Increase gait velocity from .67 ft/sec to >/= 1.3 ft/sec with RW for incr. gait efficiency.    Time  8    Period  Weeks    Status  New    Target Date  11/22/18      PT LONG TERM GOAL #5   Title  Pt  will amb. 500' with RW with SBA on flat, even surface for incr. community accessibility.    Time  8    Period  Weeks    Status  New    Target Date  11/22/18             Plan - 09/22/18 2044    Clinical Impression Statement  Pt is a 72 yr old gentleman who presents with gait and balance deficits due to diabetic neuropathy and c/o pain in low back and in LLE.  Pt is at high risk for fall per TUG score of 1" 8 secs with use of RW and very slow gait velocity of .67 ft/sec with use of RW.  Pt's c/o low back and LLE pain limits standing tolerance;    History and Personal Factors relevant to plan of care:  pt received PT at this clinic in April 2013; started using RW approx. 2 yrs ago; c/o severe, chronic pain in LLE and low back    Clinical Presentation  Stable    Clinical Presentation due to:  diabetic neuropathy; chronic pain    Clinical Decision Making  Low    Rehab Potential  Good    PT Frequency  2x / week    PT Duration  8 weeks   15 visits approved by [facility] 09-22-18 - 01-22-19   PT Treatment/Interventions  ADLs/Self Care Home Management;Aquatic Therapy;Therapeutic exercise;Therapeutic activities;Functional mobility training;Stair training;Gait training;Balance training;Neuromuscular re-education;Patient/family education    PT Next Visit Plan  begin balance HEP; do Berg balance test    PT Home Exercise Plan  balance exercises     Recommended Other Services  pt would benefit from aquatic therapy    Consulted and Agree with Plan of Care  Patient       Patient will benefit from skilled therapeutic intervention in order to improve the following deficits and impairments:  Abnormal gait, Decreased balance, Decreased strength, Impaired sensation, Pain, Decreased activity tolerance  Visit Diagnosis: Muscle weakness (generalized) - Plan: PT plan of care cert/re-cert  Chronic left-sided low back pain without sciatica - Plan: PT plan of care cert/re-cert  Other abnormalities of gait and  mobility - Plan: PT plan of care cert/re-cert  Unsteadiness on feet - Plan: PT plan of care cert/re-cert     Problem List Patient Active Problem List   Diagnosis Date Noted  . SBO (small bowel obstruction) (HCC) 07/13/2017    Bobetta Korf,  Donavan Burnet, PT 09/22/2018, 9:12 PM  [facility] [address] [place], [place], [address] Phone: [phone]   Fax:  [phone]  Name: Ethan Johnson MRN: [id number] Date of Birth: [birth year]

## 2018-09-30 ENCOUNTER — Ambulatory Visit: Payer: No Typology Code available for payment source | Attending: Internal Medicine | Admitting: Physical Therapy

## 2018-09-30 ENCOUNTER — Encounter: Payer: Self-pay | Admitting: Physical Therapy

## 2018-09-30 DIAGNOSIS — M6281 Muscle weakness (generalized): Secondary | ICD-10-CM | POA: Diagnosis present

## 2018-09-30 DIAGNOSIS — R2689 Other abnormalities of gait and mobility: Secondary | ICD-10-CM | POA: Diagnosis present

## 2018-09-30 DIAGNOSIS — R2681 Unsteadiness on feet: Secondary | ICD-10-CM | POA: Diagnosis present

## 2018-09-30 NOTE — Therapy (Signed)
[facility] [address] [place], [place], [address] Phone: [phone]   Fax:  [phone]  Physical Therapy Treatment  Patient Details  Name: Ethan Johnson MRN: [id number] Date of Birth: 01/20/1946 Referring Provider (PT): Dr. Sondra Come   Encounter Date: 09/30/2018  PT End of Session - 09/30/18 1232    Visit Number  2    Number of Visits  15    Date for PT Re-Evaluation  11/22/18    Authorization Type  [facility]    Authorization Time Period  09-22-18 - 01-22-19    Authorization - Visit Number  2    Authorization - Number of Visits  15    PT Start Time  0815   pt arrived 49" late for PT appt.   PT Stop Time  0847    PT Time Calculation (min)  32 min       Past Medical History:  Diagnosis Date  . BPH (benign prostatic hyperplasia)   . Diabetes mellitus without complication (HCC)     History reviewed. No pertinent surgical history.  There were no vitals filed for this visit.  Subjective Assessment - 09/30/18 1225    Subjective  Pt arrives 15" late for appt - states traffic was really heavy and states he is having more pain and difficulty moving today due to the weather (damp and windy today)    Pertinent History  DM with neuropathy    Patient Stated Goals  get rid of some of the pain in my left leg and be able to walk again without the walker    Currently in Pain?  Yes    Pain Score  10-Worst pain ever    Pain Location  --   generalized   Pain Orientation  Other (Comment)   generalized all over body   Pain Descriptors / Indicators  Constant;Aching;Throbbing;Tingling    Pain Type  Chronic pain;Neuropathic pain    Pain Onset  More than a month ago                       [facility] Adult PT Treatment/Exercise - 09/30/18 0828      Transfers   Transfers  Sit to Stand    Number of Reps  Other reps (comment)   5   Comments  1 UE support from mat used - feet on floor      Ambulation/Gait    Ambulation/Gait  Yes    Ambulation/Gait Assistance  5: Supervision    Ambulation Distance (Feet)  115 Feet    Assistive device  4-wheeled walker    Gait Pattern  Step-through pattern    Ambulation Surface  Level;Indoor      Exercises   Exercises  Lumbar;Knee/Hip;Ankle      Knee/Hip Exercises: Aerobic   Recumbent Bike  SciFit - level 1.5 x 5" with UE's and LE's      Knee/Hip Exercises: Standing   Heel Raises  Both;1 set;10 reps          Balance Exercises - 09/30/18 1229      Balance Exercises: Standing   Sidestepping  2 reps   along counter   Other Standing Exercises  Pt performed standing marching in place 10 reps each leg;  standing hip flexion, abduction and extension 10 reps with bil. UE support at counter           PT Short Term Goals - 09/22/18 2052      PT  SHORT TERM GOAL #1   Title  Pt will perform sit to stand 5 reps with 1 UE support from mat to demo improved LE strength.    Baseline  bil. UE support needed     Time  4    Period  Weeks    Status  New    Target Date  10/22/18      PT SHORT TERM GOAL #2   Title  Increase gait velocity from .67 ft/sec to >/= 1.0 ft/sec with RW for incr. gait efficiency.    Baseline  48.97 secs = .67 ft/sec with RW    Time  4    Period  Weeks    Status  New    Target Date  10/22/18      PT SHORT TERM GOAL #3   Title  Improve TUG score from 1" 8 secs with RW to </= 48 secs with RW to demo improved mobility.    Time  4    Period  Weeks    Status  New    Target Date  10/22/18      PT SHORT TERM GOAL #4   Title  Pt wll amb. 300' with RW on flat, even surface with SBA for increased community accessibility.    Baseline  100' with RW on 09-22-18    Time  4    Period  Weeks    Status  New    Target Date  10/22/18      PT SHORT TERM GOAL #5   Title  Independent in HEP for balance and strengthening.     Time  4    Period  Weeks    Status  New    Target Date  10/22/18        PT Long Term Goals - 09/22/18 2100       PT LONG TERM GOAL #1   Title  Perform 1 rep of sit to stand transfer from mat without UE support to demo increased LE strength.    Time  8    Period  Weeks    Status  New    Target Date  11/22/18      PT LONG TERM GOAL #2   Title  Improve TUG score from 1" 8 secs with RW to </= 40 secs with RW to demo improved functional mobility.    Time  8    Period  Weeks    Status  New    Target Date  11/22/18      PT LONG TERM GOAL #3   Title  Pt will be independent in aquatic exercise program to continue independently upon D/C from PT, pending authorization from [place] for aquatic therapy.    Time  8    Period  Weeks    Status  New    Target Date  11/22/18      PT LONG TERM GOAL #4   Title  Increase gait velocity from .67 ft/sec to >/= 1.3 ft/sec with RW for incr. gait efficiency.    Time  8    Period  Weeks    Status  New    Target Date  11/22/18      PT LONG TERM GOAL #5   Title  Pt will amb. 500' with RW with SBA on flat, even surface for incr. community accessibility.    Time  8    Period  Weeks    Status  New    Target Date  11/22/18            Plan - 09/30/18 1233    Clinical Impression Statement  Pt presents with greater weakness in RLE than in LLE today, with pt reporting more pain and difficulty moving due to damp, windy weather today.  Standing tolerance is impacted by c/o back and leg pain; pt requires UE support for safety with balance exercises.  Pt had difficulty lifting RLE for forward hip flexion with LUE support on counter.       Rehab Potential  Good    PT Frequency  2x / week    PT Duration  8 weeks    PT Treatment/Interventions  ADLs/Self Care Home Management;Aquatic Therapy;Therapeutic exercise;Therapeutic activities;Functional mobility training;Stair training;Gait training;Balance training;Neuromuscular re-education;Patient/family education    PT Next Visit Plan  begin balance HEP; do Berg balance test - give balance exercise pictures for HEP    PT Home  Exercise Plan  balance exercises     Consulted and Agree with Plan of Care  Patient       Patient will benefit from skilled therapeutic intervention in order to improve the following deficits and impairments:  Abnormal gait, Decreased balance, Decreased strength, Impaired sensation, Pain, Decreased activity tolerance  Visit Diagnosis: Other abnormalities of gait and mobility  Muscle weakness (generalized)  Unsteadiness on feet     Problem List Patient Active Problem List   Diagnosis Date Noted  . SBO (small bowel obstruction) (HCC) 07/13/2017    Karishma Unrein, Donavan Burnet, PT 09/30/2018, 12:38 PM  [place] [address] [place], [place], [address] Phone: [phone]   Fax:  [phone]  Name: Ethan Johnson MRN: [id number] Date of Birth: March 14, 1946

## 2018-10-07 ENCOUNTER — Ambulatory Visit: Payer: No Typology Code available for payment source | Admitting: Physical Therapy

## 2018-10-07 DIAGNOSIS — R2689 Other abnormalities of gait and mobility: Secondary | ICD-10-CM

## 2018-10-07 DIAGNOSIS — M6281 Muscle weakness (generalized): Secondary | ICD-10-CM

## 2018-10-08 ENCOUNTER — Encounter: Payer: Self-pay | Admitting: Physical Therapy

## 2018-10-08 NOTE — Therapy (Signed)
[facility] [address] [place], [place], [address] Phone: [phone]   Fax:  [phone]  Physical Therapy Treatment  Patient Details  Name: Ethan Johnson MRN: [id number] Date of Birth: 08-04-1946 Referring Provider (PT): Dr. Sondra Come   Encounter Date: 10/07/2018  PT End of Session - 10/08/18 1012    Visit Number  3    Number of Visits  15    Date for PT Re-Evaluation  11/22/18    Authorization Type  VA    Authorization Time Period  09-22-18 - 01-22-19    Authorization - Visit Number  3    Authorization - Number of Visits  15    PT Start Time  1105    PT Stop Time  1150    PT Time Calculation (min)  45 min       Past Medical History:  Diagnosis Date  . BPH (benign prostatic hyperplasia)   . Diabetes mellitus without complication (HCC)     History reviewed. No pertinent surgical history.  There were no vitals filed for this visit.  Subjective Assessment - 10/08/18 1003    Subjective  Pt states he did the exercises but feels that the first few reps increase pain - reports having some difficulty with them    Pertinent History  DM with neuropathy    Patient Stated Goals  get rid of some of the pain in my left leg and be able to walk again without the walker    Currently in Pain?  Yes    Pain Score  9     Pain Location  --   generalized but more in low back   Pain Orientation  Lower    Pain Descriptors / Indicators  Aching;Constant;Throbbing;Tingling    Pain Type  Chronic pain    Pain Onset  More than a month ago    Pain Frequency  Constant                       [facility] Adult PT Treatment/Exercise - 10/08/18 0001      Transfers   Transfers  Sit to Stand    Number of Reps  Other reps (comment)   3   Comments  1 UE support from mat used - feet on floor      Ambulation/Gait   Ambulation/Gait  Yes    Ambulation/Gait Assistance  5: Supervision    Ambulation/Gait Assistance Details   increased UE weight bearing due to LBP    Ambulation Distance (Feet)  230 Feet   2 laps around track x 2 reps - seated rest after 230'    Assistive device  4-wheeled walker    Gait Pattern  Step-through pattern    Ambulation Surface  Level;Indoor      Exercises   Exercises  Lumbar;Knee/Hip      Lumbar Exercises: Seated   Other Seated Lumbar Exercises  Pt performed assisted pelvic tilts seated on large rockerboard on mat - mod to max assist needed for moving board posteriorly to increase posterior pelvic tilt 2 sets 10 reps; removed rocker board and pt performed independently -anterior/posterior pelvic tilts 2 sets 10 reps       Knee/Hip Exercises: Stretches   Active Hamstring Stretch  Both;1 rep;30 seconds    Gastroc Stretch  Both;1 rep;30 seconds   with 2" step in standing     Knee/Hip Exercises: Aerobic   Recumbent Bike  SciFit - level 2.0 x  5" with UE's and LE's      Knee/Hip Exercises: Standing   Heel Raises  Both;1 set;10 reps          Balance Exercises - 10/08/18 1010      Balance Exercises: Standing   Other Standing Exercises  Pt performed standing marching, hip flexion, extension and abduction 10 reps each leg with UE support on RW          PT Short Term Goals - 09/22/18 2052      PT SHORT TERM GOAL #1   Title  Pt will perform sit to stand 5 reps with 1 UE support from mat to demo improved LE strength.    Baseline  bil. UE support needed     Time  4    Period  Weeks    Status  New    Target Date  10/22/18      PT SHORT TERM GOAL #2   Title  Increase gait velocity from .67 ft/sec to >/= 1.0 ft/sec with RW for incr. gait efficiency.    Baseline  48.97 secs = .67 ft/sec with RW    Time  4    Period  Weeks    Status  New    Target Date  10/22/18      PT SHORT TERM GOAL #3   Title  Improve TUG score from 1" 8 secs with RW to </= 48 secs with RW to demo improved mobility.    Time  4    Period  Weeks    Status  New    Target Date  10/22/18      PT  SHORT TERM GOAL #4   Title  Pt wll amb. 300' with RW on flat, even surface with SBA for increased community accessibility.    Baseline  100' with RW on 09-22-18    Time  4    Period  Weeks    Status  New    Target Date  10/22/18      PT SHORT TERM GOAL #5   Title  Independent in HEP for balance and strengthening.     Time  4    Period  Weeks    Status  New    Target Date  10/22/18        PT Long Term Goals - 09/22/18 2100      PT LONG TERM GOAL #1   Title  Perform 1 rep of sit to stand transfer from mat without UE support to demo increased LE strength.    Time  8    Period  Weeks    Status  New    Target Date  11/22/18      PT LONG TERM GOAL #2   Title  Improve TUG score from 1" 8 secs with RW to </= 40 secs with RW to demo improved functional mobility.    Time  8    Period  Weeks    Status  New    Target Date  11/22/18      PT LONG TERM GOAL #3   Title  Pt will be independent in aquatic exercise program to continue independently upon D/C from PT, pending authorization from [place] for aquatic therapy.    Time  8    Period  Weeks    Status  New    Target Date  11/22/18      PT LONG TERM GOAL #4   Title  Increase gait velocity from .67 ft/sec  to >/= 1.3 ft/sec with RW for incr. gait efficiency.    Time  8    Period  Weeks    Status  New    Target Date  11/22/18      PT LONG TERM GOAL #5   Title  Pt will amb. 500' with RW with SBA on flat, even surface for incr. community accessibility.    Time  8    Period  Weeks    Status  New    Target Date  11/22/18            Plan - 10/08/18 1013    Clinical Impression Statement  Pt initially had very minimal active posterior pelvic tilt; rockerboard significantly facilitated pelvic mobility, allowing pt to independently perform active pelvic rocking; pt reported decreased back pain after this exercise    Rehab Potential  Good    PT Frequency  2x / week    PT Duration  8 weeks    PT Treatment/Interventions  ADLs/Self  Care Home Management;Aquatic Therapy;Therapeutic exercise;Therapeutic activities;Functional mobility training;Stair training;Gait training;Balance training;Neuromuscular re-education;Patient/family education    PT Next Visit Plan  Pt to go to [facility] for [employer] - begin balance HEP; do Berg balance test - give balance exercise pictures for HEP    PT Home Exercise Plan  balance exercises     Consulted and Agree with Plan of Care  Patient       Patient will benefit from skilled therapeutic intervention in order to improve the following deficits and impairments:  Abnormal gait, Decreased balance, Decreased strength, Impaired sensation, Pain, Decreased activity tolerance  Visit Diagnosis: Other abnormalities of gait and mobility  Muscle weakness (generalized)     Problem List Patient Active Problem List   Diagnosis Date Noted  . SBO (small bowel obstruction) (HCC) 07/13/2017    [place], Donavan Burnet, PT 10/08/2018, 10:16 AM  [place] [facility] [address] [place], [place], [address] Phone: [phone]   Fax:  [phone]  Name: Ethan Johnson MRN: [id number] Date of Birth: 03-Jul-1946

## 2018-10-09 ENCOUNTER — Ambulatory Visit: Payer: No Typology Code available for payment source | Admitting: Physical Therapy

## 2018-10-09 DIAGNOSIS — M6281 Muscle weakness (generalized): Secondary | ICD-10-CM

## 2018-10-09 DIAGNOSIS — R2681 Unsteadiness on feet: Secondary | ICD-10-CM

## 2018-10-09 DIAGNOSIS — R2689 Other abnormalities of gait and mobility: Secondary | ICD-10-CM

## 2018-10-10 ENCOUNTER — Encounter: Payer: Self-pay | Admitting: Physical Therapy

## 2018-10-10 NOTE — Therapy (Signed)
[facility] [address] [place], [place], [address] Phone: [phone]   Fax:  [phone]  Physical Therapy Treatment  Patient Details  Name: Ethan Johnson MRN: [id number] Date of Birth: 08-03-1946 Referring Provider (PT): Dr. Sondra Come   Encounter Date: 10/09/2018  PT End of Session - 10/10/18 1749    Visit Number  4    Number of Visits  15    Date for PT Re-Evaluation  11/22/18    Authorization Type  [facility]    Authorization Time Period  09-22-18 - 01-22-19    Authorization - Visit Number  4    Authorization - Number of Visits  15    PT Start Time  0850    PT Stop Time  0933    PT Time Calculation (min)  43 min       Past Medical History:  Diagnosis Date  . BPH (benign prostatic hyperplasia)   . Diabetes mellitus without complication (HCC)     History reviewed. No pertinent surgical history.  There were no vitals filed for this visit.  Subjective Assessment - 10/10/18 1742    Subjective  Pt states he continues to have the pain - says it is constant;  has tried some of the exercises    Pertinent History  DM with neuropathy    Limitations  Walking    How long can you stand comfortably?  approx. 10"     Patient Stated Goals  get rid of some of the pain in my left leg and be able to walk again without the walker    Currently in Pain?  Yes    Pain Score  8     Pain Location  --   generalized but more in low back   Pain Orientation  Right;Left    Pain Descriptors / Indicators  Aching;Constant;Throbbing;Nagging    Pain Type  Chronic pain    Pain Onset  More than a month ago    Pain Frequency  Constant                       [facility] Adult PT Treatment/Exercise - 10/10/18 0001      Transfers   Transfers  Sit to Stand    Number of Reps  Other reps (comment)   5   Comments  1 UE support from mat used - feet on floor      Ambulation/Gait   Ambulation/Gait  Yes    Ambulation/Gait Assistance   5: Supervision    Ambulation/Gait Assistance Details  incr. UE weight bearing due to c/o LBP    Ambulation Distance (Feet)  200 Feet    Assistive device  4-wheeled walker    Gait Pattern  Step-through pattern    Ambulation Surface  Level;Indoor      Knee/Hip Exercises: Programme researcher, broadcasting/film/video  Both;1 rep;30 seconds    Gastroc Stretch  Both;1 rep;30 seconds   with 2" step in standing     Knee/Hip Exercises: Aerobic   Recumbent Bike  SciFit - level 2.0 x 5" with UE's and LE's      Knee/Hip Exercises: Standing   Heel Raises  Both;1 set;10 reps          Balance Exercises - 10/10/18 1746      Balance Exercises: Standing   Other Standing Exercises  Pt performed stepping over and back of orange hurdle with 1 UE support on counter, 1 UE support  with HHA:  touching balance bubbles with each foot 5 reps each leg (3 bubbles in half circle)       Pt performed alternate tap ups to 6" step with UE support on rails - 10 reps each foot  Pt performed standing hip flexion, extension, and abduction 10 reps each leg alternating with UE suppport on counter   PT Short Term Goals - 09/22/18 2052      PT SHORT TERM GOAL #1   Title  Pt will perform sit to stand 5 reps with 1 UE support from mat to demo improved LE strength.    Baseline  bil. UE support needed     Time  4    Period  Weeks    Status  New    Target Date  10/22/18      PT SHORT TERM GOAL #2   Title  Increase gait velocity from .67 ft/sec to >/= 1.0 ft/sec with RW for incr. gait efficiency.    Baseline  48.97 secs = .67 ft/sec with RW    Time  4    Period  Weeks    Status  New    Target Date  10/22/18      PT SHORT TERM GOAL #3   Title  Improve TUG score from 1" 8 secs with RW to </= 48 secs with RW to demo improved mobility.    Time  4    Period  Weeks    Status  New    Target Date  10/22/18      PT SHORT TERM GOAL #4   Title  Pt wll amb. 300' with RW on flat, even surface with SBA for increased community  accessibility.    Baseline  100' with RW on 09-22-18    Time  4    Period  Weeks    Status  New    Target Date  10/22/18      PT SHORT TERM GOAL #5   Title  Independent in HEP for balance and strengthening.     Time  4    Period  Weeks    Status  New    Target Date  10/22/18        PT Long Term Goals - 09/22/18 2100      PT LONG TERM GOAL #1   Title  Perform 1 rep of sit to stand transfer from mat without UE support to demo increased LE strength.    Time  8    Period  Weeks    Status  New    Target Date  11/22/18      PT LONG TERM GOAL #2   Title  Improve TUG score from 1" 8 secs with RW to </= 40 secs with RW to demo improved functional mobility.    Time  8    Period  Weeks    Status  New    Target Date  11/22/18      PT LONG TERM GOAL #3   Title  Pt will be independent in aquatic exercise program to continue independently upon D/C from PT, pending authorization from [place] for aquatic therapy.    Time  8    Period  Weeks    Status  New    Target Date  11/22/18      PT LONG TERM GOAL #4   Title  Increase gait velocity from .67 ft/sec to >/= 1.3 ft/sec with RW for incr. gait efficiency.  Time  8    Period  Weeks    Status  New    Target Date  11/22/18      PT LONG TERM GOAL #5   Title  Pt will amb. 500' with RW with SBA on flat, even surface for incr. community accessibility.    Time  8    Period  Weeks    Status  New    Target Date  11/22/18            Plan - 10/10/18 1750    Clinical Impression Statement  Pt improving mobility with increased step length and increased initial heel contact at stance phase of gait noted at end of session compared to gait pattern at beginning of session;  Pt continues to have excessive weight bearing on UE's due to c/o back pain     Rehab Potential  Good    PT Frequency  2x / week    PT Duration  8 weeks    PT Treatment/Interventions  ADLs/Self Care Home Management;Aquatic Therapy;Therapeutic exercise;Therapeutic  activities;Functional mobility training;Stair training;Gait training;Balance training;Neuromuscular re-education;Patient/family education    PT Next Visit Plan  Pt to go to [facility] [employer] - begin balance HEP; do Berg balance test - give balance exercise pictures for HEP    PT Home Exercise Plan  balance exercises     Consulted and Agree with Plan of Care  Patient       Patient will benefit from skilled therapeutic intervention in order to improve the following deficits and impairments:  Abnormal gait, Decreased balance, Decreased strength, Impaired sensation, Pain, Decreased activity tolerance  Visit Diagnosis: Other abnormalities of gait and mobility  Muscle weakness (generalized)  Unsteadiness on feet     Problem List Patient Active Problem List   Diagnosis Date Noted  . SBO (small bowel obstruction) (HCC) 07/13/2017    Karysa Heft, Donavan Burnet, PT 10/10/2018, 5:56 PM  [facility] [address] [place], [place], [address] Phone: [phone]   Fax:  [phone]  Name: HERSHELL BRANDL MRN: [id number] Date of Birth: 04-Jun-1946

## 2018-10-13 ENCOUNTER — Ambulatory Visit: Payer: No Typology Code available for payment source | Admitting: Physical Therapy

## 2018-10-13 DIAGNOSIS — M6281 Muscle weakness (generalized): Secondary | ICD-10-CM

## 2018-10-13 DIAGNOSIS — R2681 Unsteadiness on feet: Secondary | ICD-10-CM

## 2018-10-13 DIAGNOSIS — R2689 Other abnormalities of gait and mobility: Secondary | ICD-10-CM | POA: Diagnosis not present

## 2018-10-14 ENCOUNTER — Encounter: Payer: Self-pay | Admitting: Physical Therapy

## 2018-10-14 NOTE — Therapy (Signed)
[facility] [address] [place], [place], [address] Phone: [phone]   Fax:  [phone]  Physical Therapy Treatment  Patient Details  Name: Ethan Johnson MRN: [id number] Date of Birth: 11/03/1946 Referring Provider (PT): Dr. Sondra Come   Encounter Date: 10/13/2018  PT End of Session - 10/14/18 1936    Visit Number  5    Number of Visits  15    Date for PT Re-Evaluation  11/22/18    Authorization Type  VA    Authorization Time Period  09-22-18 - 01-22-19    Authorization - Visit Number  5    Authorization - Number of Visits  15    PT Start Time  1020    PT Stop Time  1103    PT Time Calculation (min)  43 min       Past Medical History:  Diagnosis Date  . BPH (benign prostatic hyperplasia)   . Diabetes mellitus without complication (HCC)     History reviewed. No pertinent surgical history.  There were no vitals filed for this visit.  Subjective Assessment - 10/14/18 1931    Subjective  Pt states he thinks he is doing a little better overall; reports doing exercises some at home    Patient Stated Goals  get rid of some of the pain in my left leg and be able to walk again without the walker    Currently in Pain?  Yes    Pain Score  8     Pain Orientation  Lower;Right;Left    Pain Descriptors / Indicators  Aching;Constant;Nagging    Pain Type  Chronic pain    Pain Onset  1 to 4 weeks ago    Pain Frequency  Constant                       [facility] Adult PT Treatment/Exercise - 10/14/18 0001      Transfers   Transfers  Sit to Stand    Number of Reps  Other reps (comment)   5   Comments  1 UE support from mat used - feet on floor      Ambulation/Gait   Ambulation/Gait  Yes    Ambulation/Gait Assistance  5: Supervision    Ambulation Distance (Feet)  230 Feet    Assistive device  4-wheeled walker    Gait Pattern  Step-through pattern    Ambulation Surface  Level;Indoor      Knee/Hip  Exercises: Programme researcher, broadcasting/film/video  Both;1 rep;30 seconds    Gastroc Stretch  Both;1 rep;30 seconds   with 2" step in standing     Knee/Hip Exercises: Aerobic   Recumbent Bike  SciFit - level 2.0 x 5" with UE's and LE's      Knee/Hip Exercises: Standing   Heel Raises  Both;1 set;10 reps          Balance Exercises - 10/14/18 1934      Balance Exercises: Standing   Other Standing Exercises  Pt performed stepping over and back of balance beam with 1 UE support on counter, 1 UE support with HHA:  touching balance bubbles with each foot 5 reps each leg (3 bubbles in half circle)           PT Short Term Goals - 09/22/18 2052      PT SHORT TERM GOAL #1   Title  Pt will perform sit to stand 5 reps with 1 UE  support from mat to demo improved LE strength.    Baseline  bil. UE support needed     Time  4    Period  Weeks    Status  New    Target Date  10/22/18      PT SHORT TERM GOAL #2   Title  Increase gait velocity from .67 ft/sec to >/= 1.0 ft/sec with RW for incr. gait efficiency.    Baseline  48.97 secs = .67 ft/sec with RW    Time  4    Period  Weeks    Status  New    Target Date  10/22/18      PT SHORT TERM GOAL #3   Title  Improve TUG score from 1" 8 secs with RW to </= 48 secs with RW to demo improved mobility.    Time  4    Period  Weeks    Status  New    Target Date  10/22/18      PT SHORT TERM GOAL #4   Title  Pt wll amb. 300' with RW on flat, even surface with SBA for increased community accessibility.    Baseline  100' with RW on 09-22-18    Time  4    Period  Weeks    Status  New    Target Date  10/22/18      PT SHORT TERM GOAL #5   Title  Independent in HEP for balance and strengthening.     Time  4    Period  Weeks    Status  New    Target Date  10/22/18        PT Long Term Goals - 09/22/18 2100      PT LONG TERM GOAL #1   Title  Perform 1 rep of sit to stand transfer from mat without UE support to demo increased LE strength.     Time  8    Period  Weeks    Status  New    Target Date  11/22/18      PT LONG TERM GOAL #2   Title  Improve TUG score from 1" 8 secs with RW to </= 40 secs with RW to demo improved functional mobility.    Time  8    Period  Weeks    Status  New    Target Date  11/22/18      PT LONG TERM GOAL #3   Title  Pt will be independent in aquatic exercise program to continue independently upon D/C from PT, pending authorization from [place] for aquatic therapy.    Time  8    Period  Weeks    Status  New    Target Date  11/22/18      PT LONG TERM GOAL #4   Title  Increase gait velocity from .67 ft/sec to >/= 1.3 ft/sec with RW for incr. gait efficiency.    Time  8    Period  Weeks    Status  New    Target Date  11/22/18      PT LONG TERM GOAL #5   Title  Pt will amb. 500' with RW with SBA on flat, even surface for incr. community accessibility.    Time  8    Period  Weeks    Status  New    Target Date  11/22/18            Plan - 10/14/18 1937  Clinical Impression Statement  Pt is progressing towards goals; mobility and standing tolerance is limited by c/o back pain    Rehab Potential  Good    PT Frequency  2x / week    PT Duration  8 weeks    PT Treatment/Interventions  ADLs/Self Care Home Management;Aquatic Therapy;Therapeutic exercise;Therapeutic activities;Functional mobility training;Stair training;Gait training;Balance training;Neuromuscular re-education;Patient/family education    PT Next Visit Plan  Pt to go to [facility] for [employer] - begin balance HEP; do Berg balance test - give balance exercise pictures for HEP    PT Home Exercise Plan  balance exercises     Consulted and Agree with Plan of Care  Patient       Patient will benefit from skilled therapeutic intervention in order to improve the following deficits and impairments:  Abnormal gait, Decreased balance, Decreased strength, Impaired sensation, Pain, Decreased activity tolerance  Visit  Diagnosis: Other abnormalities of gait and mobility  Muscle weakness (generalized)  Unsteadiness on feet     Problem List Patient Active Problem List   Diagnosis Date Noted  . SBO (small bowel obstruction) (HCC) 07/13/2017    Noah Lembke, Donavan Burnet, PT 10/14/2018, 7:40 PM  [facility] [address] [place], [place], [address] Phone: [phone]   Fax:  [phone]  Name: Ethan Johnson MRN: [id number] Date of Birth: 09/07/1946

## 2018-10-16 ENCOUNTER — Ambulatory Visit: Payer: No Typology Code available for payment source | Admitting: Physical Therapy

## 2018-10-17 ENCOUNTER — Other Ambulatory Visit (HOSPITAL_COMMUNITY): Payer: Self-pay | Admitting: Internal Medicine

## 2018-10-17 ENCOUNTER — Ambulatory Visit (HOSPITAL_COMMUNITY)
Admission: RE | Admit: 2018-10-17 | Discharge: 2018-10-17 | Disposition: A | Discharge status: Home / Self Care | Payer: No Typology Code available for payment source | Source: Ambulatory Visit | Attending: Internal Medicine | Admitting: Internal Medicine

## 2018-10-17 DIAGNOSIS — M25551 Pain in right hip: Secondary | ICD-10-CM | POA: Diagnosis present

## 2018-10-17 DIAGNOSIS — M25552 Pain in left hip: Secondary | ICD-10-CM | POA: Insufficient documentation

## 2018-10-17 DIAGNOSIS — M160 Bilateral primary osteoarthritis of hip: Secondary | ICD-10-CM | POA: Diagnosis not present

## 2018-10-21 ENCOUNTER — Ambulatory Visit: Payer: No Typology Code available for payment source | Admitting: Physical Therapy

## 2018-10-21 DIAGNOSIS — R2681 Unsteadiness on feet: Secondary | ICD-10-CM

## 2018-10-21 DIAGNOSIS — R2689 Other abnormalities of gait and mobility: Secondary | ICD-10-CM

## 2018-10-21 DIAGNOSIS — M6281 Muscle weakness (generalized): Secondary | ICD-10-CM

## 2018-10-22 ENCOUNTER — Encounter: Payer: Self-pay | Admitting: Physical Therapy

## 2018-10-22 NOTE — Therapy (Signed)
[facility] [address] [place], [place], [address] Phone: [phone]   Fax:  [phone]  Physical Therapy Treatment  Patient Details  Name: Ethan Johnson MRN: [id number] Date of Birth: 1946/06/02 Referring Provider (PT): Dr. Sondra Come   Encounter Date: 10/21/2018  PT End of Session - 10/22/18 2200    Visit Number  6    Number of Visits  15    Date for PT Re-Evaluation  11/22/18    Authorization Type  VA    Authorization Time Period  09-22-18 - 01-22-19    Authorization - Visit Number  6    Authorization - Number of Visits  15    PT Start Time  1017    PT Stop Time  1102    PT Time Calculation (min)  45 min       Past Medical History:  Diagnosis Date  . BPH (benign prostatic hyperplasia)   . Diabetes mellitus without complication (HCC)     History reviewed. No pertinent surgical history.  There were no vitals filed for this visit.  Subjective Assessment - 10/22/18 2153    Subjective  Pt states he is having pain today - due to the cloudy weather    Patient Stated Goals  get rid of some of the pain in my left leg and be able to walk again without the walker    Pain Score  10-Worst pain ever                       [facility] Adult PT Treatment/Exercise - 10/22/18 0001      Ambulation/Gait   Ambulation/Gait  Yes    Ambulation/Gait Assistance  5: Supervision    Ambulation/Gait Assistance Details  cues to lift feet    Ambulation Distance (Feet)  230 Feet    Assistive device  4-wheeled walker    Gait Pattern  Step-through pattern    Ambulation Surface  Level;Indoor      Exercises   Exercises  Knee/Hip      Knee/Hip Exercises: Stretches   Active Hamstring Stretch  Both;1 rep;30 seconds    Gastroc Stretch  Both;1 rep;30 seconds   with 2" step in standing     Knee/Hip Exercises: Aerobic   Recumbent Bike  SciFit - level 2.0 x 5" with UE's and LE's      Knee/Hip Exercises: Machines for  Strengthening   Cybex Leg Press  bil. LE's 60# 2 sets 10 reps       Knee/Hip Exercises: Standing   Heel Raises  Both;1 set;10 reps    Forward Step Up  Both;1 set;10 reps;Hand Hold: 1;Step Height: 6"          Balance Exercises - 10/22/18 2159      Balance Exercises: Standing   Other Standing Exercises  Pt performed standing marching, hip flexion, extension and abduction 10 reps each leg with UE support on RW          PT Short Term Goals - 09/22/18 2052      PT SHORT TERM GOAL #1   Title  Pt will perform sit to stand 5 reps with 1 UE support from mat to demo improved LE strength.    Baseline  bil. UE support needed     Time  4    Period  Weeks    Status  New    Target Date  10/22/18      PT SHORT TERM GOAL #  2   Title  Increase gait velocity from .67 ft/sec to >/= 1.0 ft/sec with RW for incr. gait efficiency.    Baseline  48.97 secs = .67 ft/sec with RW    Time  4    Period  Weeks    Status  New    Target Date  10/22/18      PT SHORT TERM GOAL #3   Title  Improve TUG score from 1" 8 secs with RW to </= 48 secs with RW to demo improved mobility.    Time  4    Period  Weeks    Status  New    Target Date  10/22/18      PT SHORT TERM GOAL #4   Title  Pt wll amb. 300' with RW on flat, even surface with SBA for increased community accessibility.    Baseline  100' with RW on 09-22-18    Time  4    Period  Weeks    Status  New    Target Date  10/22/18      PT SHORT TERM GOAL #5   Title  Independent in HEP for balance and strengthening.     Time  4    Period  Weeks    Status  New    Target Date  10/22/18        PT Long Term Goals - 09/22/18 2100      PT LONG TERM GOAL #1   Title  Perform 1 rep of sit to stand transfer from mat without UE support to demo increased LE strength.    Time  8    Period  Weeks    Status  New    Target Date  11/22/18      PT LONG TERM GOAL #2   Title  Improve TUG score from 1" 8 secs with RW to </= 40 secs with RW to demo  improved functional mobility.    Time  8    Period  Weeks    Status  New    Target Date  11/22/18      PT LONG TERM GOAL #3   Title  Pt will be independent in aquatic exercise program to continue independently upon D/C from PT, pending authorization from [place] for aquatic therapy.    Time  8    Period  Weeks    Status  New    Target Date  11/22/18      PT LONG TERM GOAL #4   Title  Increase gait velocity from .67 ft/sec to >/= 1.3 ft/sec with RW for incr. gait efficiency.    Time  8    Period  Weeks    Status  New    Target Date  11/22/18      PT LONG TERM GOAL #5   Title  Pt will amb. 500' with RW with SBA on flat, even surface for incr. community accessibility.    Time  8    Period  Weeks    Status  New    Target Date  11/22/18            Plan - 10/22/18 2201    Clinical Impression Statement  Pt continues to be limited by back pain for increasing mobility and ability to amb. with decreased UE support; pt's balance is limited by need for UE support due to back pain and LLE pain    PT Frequency  2x / week    PT  Duration  8 weeks    PT Treatment/Interventions  ADLs/Self Care Home Management;Aquatic Therapy;Therapeutic exercise;Therapeutic activities;Functional mobility training;Stair training;Gait training;Balance training;Neuromuscular re-education;Patient/family education    PT Next Visit Plan  Pt to go to [facility] for [employer] - begin balance HEP; do Berg balance test - give balance exercise pictures for HEP    PT Home Exercise Plan  balance exercises     Consulted and Agree with Plan of Care  Patient       Patient will benefit from skilled therapeutic intervention in order to improve the following deficits and impairments:  Abnormal gait, Decreased balance, Decreased strength, Impaired sensation, Pain, Decreased activity tolerance  Visit Diagnosis: Other abnormalities of gait and mobility  Muscle weakness (generalized)  Unsteadiness on feet     Problem  List Patient Active Problem List   Diagnosis Date Noted  . SBO (small bowel obstruction) (HCC) 07/13/2017    Deneka Greenwalt, Donavan Burnet, PT 10/22/2018, 10:05 PM  [facility] [address] [place], [place], [address] Phone: [phone]   Fax:  [phone]  Name: Ethan Johnson MRN: [id number] Date of Birth: September 05, 1946

## 2018-10-23 ENCOUNTER — Ambulatory Visit: Payer: No Typology Code available for payment source | Admitting: Physical Therapy

## 2018-10-30 ENCOUNTER — Ambulatory Visit: Payer: No Typology Code available for payment source | Attending: Internal Medicine | Admitting: Physical Therapy

## 2018-10-30 ENCOUNTER — Encounter: Payer: Self-pay | Admitting: Physical Therapy

## 2018-10-30 DIAGNOSIS — R2689 Other abnormalities of gait and mobility: Secondary | ICD-10-CM | POA: Insufficient documentation

## 2018-10-30 DIAGNOSIS — R2681 Unsteadiness on feet: Secondary | ICD-10-CM | POA: Insufficient documentation

## 2018-10-30 NOTE — Therapy (Addendum)
[facility] [address] [place], [place], [address] Phone: [phone]   Fax:  [phone]  Physical Therapy Treatment  Patient Details  Name: Ethan Johnson MRN: [id number] Date of Birth: 01-10-1946 Referring Provider (PT): Dr. Windy Fast    Encounter Date: 10/30/2018  PT End of Session - 10/30/18 1302    Visit Number  7    Number of Visits  15    Date for PT Re-Evaluation  11/22/18    Authorization Type  VA    Authorization Time Period  09-22-18 - 01-22-19    Authorization - Visit Number  7    Authorization - Number of Visits  15    PT Start Time  0930    PT Stop Time  1015    PT Time Calculation (min)  45 min    Equipment Utilized During Treatment  Gait belt    Activity Tolerance  Patient tolerated treatment well    Behavior During Therapy  [facility] for tasks assessed/performed       Past Medical History:  Diagnosis Date  . BPH (benign prostatic hyperplasia)   . Diabetes mellitus without complication ([place])     History reviewed. No pertinent surgical history.  There were no vitals filed for this visit.  Subjective Assessment - 10/30/18 0934    Subjective  Reports experiencing a fall inside and is no longer in pain. Reports his L LE "stopped working" when he was on his way to the bathroom. Reports he was able to recover from fall by himself.     Pertinent History  DM with neuropathy    Limitations  Walking    How long can you stand comfortably?  approx. 10"     Patient Stated Goals  get rid of some of the pain in my left leg and be able to walk again without the walker    Currently in Pain?  Yes    Pain Score  9     Pain Location  Leg    Pain Orientation  Left    Pain Descriptors / Indicators  Nagging;Aching;Constant    Pain Type  Chronic pain    Pain Onset  More than a month ago    Pain Frequency  Constant         [facility] Adult PT Treatment/Exercise - 10/30/18 1251      Transfers   Transfers  Sit  to Stand    Sit to Stand  5: Supervision    Sit to Stand Details (indicate cue type and reason)  Pt performs x5 STS from edge of mat with single UE assist and utilizes posterior thighs on mat to assist with achieving stands.    Number of Reps  Other reps (comment)   5     Ambulation/Gait   Ambulation/Gait  Yes    Ambulation/Gait Assistance  5: Supervision    Ambulation/Gait Assistance Details  Pt ambulates 300 feet on level surface with his 4 [employer]. Pt demonstrates poor foot clearance bilateral with decreased step length. Pt able to demonstrate improvement in step length when cued.     Ambulation Distance (Feet)  300 Feet    Assistive device  4-wheeled walker    Gait Pattern  Step-through pattern;Decreased stride length;Decreased hip/knee flexion - right;Decreased hip/knee flexion - left;Decreased dorsiflexion - right;Decreased dorsiflexion - left;Shuffle;Decreased trunk rotation;Poor foot clearance - left;Poor foot clearance - right    Ambulation Surface  Level;Indoor    Gait velocity  2.08 ft/sec with  4 [employer] indicative of limited community ambulator      Standardized Balance Assessment   Standardized Balance Assessment  Timed Up and Go Test      Timed Up and Go Test   TUG  Normal TUG    Normal TUG (seconds)  23.25   with 6AY     Neuro Re-ed    Neuro Re-ed Details   Pt performs stepping over hurdles of various heights in // bars to promote adequate foot clearance during functional mobility. Pt progresses to stepping over reciprocally with decreased UE assist. Therapist cues patient to take longer steps for improved clearance with increased DF. Pt progressed to lateral side stepping over hurdles leading with each lower extremity             PT Education - 10/30/18 1302    Education Details  Therapist provided education regarding aquatic therapy, participating in wellness program s/p future d/c, and results of short term goal reassessment.     Person(s) Educated  Patient    Methods   Explanation    Comprehension  Verbalized understanding       PT Short Term Goals - 10/30/18 1302      PT SHORT TERM GOAL #1   Title  Pt will perform sit to stand 5 reps with 1 UE support from mat to demo improved LE strength.    Baseline  11/7: Pt performs 5 STS from edge of mat table with single UE assist. Goal partially met 2/2 to heavy reliance on posterior thighs on mat to achieve stand    Time  4    Period  Weeks    Status  Partially Met      PT SHORT TERM GOAL #2   Title  Increase gait velocity from .67 ft/sec to >/= 1.0 ft/sec with RW for incr. gait efficiency.    Baseline  11/7: 2.08 ft/sec with 4WW indicative of limited community ambulator    Time  4    Period  Weeks    Status  Achieved      PT SHORT TERM GOAL #3   Title  Improve TUG score from 1" 8 secs with RW to </= 48 secs with RW to demo improved mobility.    Baseline  11/7: Pt performs TUG in 23.25 seconds indicative of high fall risk potential     Time  4    Period  Weeks    Status  Achieved      PT SHORT TERM GOAL #4   Title  Pt wll amb. 300' with RW on flat, even surface with SBA for increased community accessibility.    Baseline  11/7: Pt ambulates 300 feet on level surfaces with 4WW with supervision     Time  4    Period  Weeks    Status  Achieved      PT SHORT TERM GOAL #5   Title  Independent in HEP for balance and strengthening.     Time  4    Period  Weeks    Status  Deferred        PT Long Term Goals - 10/30/18 1305      PT LONG TERM GOAL #1   Title  Perform 1 rep of sit to stand transfer from mat without UE support to demo increased LE strength.    Time  8    Period  Weeks    Status  New      PT LONG TERM GOAL #2  Title  Improve TUG score to </=18 seconds with RW to demo improved functional mobility.    Baseline  11/7; 23.25 seconds with 4WW    Time  8    Period  Weeks    Status  Revised      PT LONG TERM GOAL #3   Title  Pt will be independent in aquatic exercise program to  continue independently upon D/C from PT, pending authorization from [place] for aquatic therapy.    Time  8    Period  Weeks    Status  New      PT LONG TERM GOAL #4   Title  Increase gait velocity from .67 ft/sec to >/= 2.62 ft/sec with RW for incr. gait efficiency.    Baseline  11/7: 2.08 ft/sec with 4WW    Time  8    Period  Weeks    Status  Revised      PT LONG TERM GOAL #5   Title  Pt will amb. 500' with RW with SBA on flat, even surface for incr. community accessibility.    Time  8    Period  Weeks    Status  New            Plan - 10/30/18 1553    Clinical Impression Statement  Skilled session focused on assessment of patient's short term goals and performing stepping over hurdles to promote increased step length with adequate foot clearance. Overall, patient has met 3/4 STG's and partially met 1/4 STG's. Pt demonstrates significant improvement in TUG performance indicated by his time of 23.25 seconds. Pt's gait speed with his 4WW has significantly improved to 2.08 ft/sec indicative of limited community ambulation. Pt demonstrates ability to ambulate 300 feet on level surfaces with his 4WW with therapist providing supervision indicating improvement with community distances. Pt continues to demonstrate LE power deficits indicated by heavy reliance on posterior thighs on mat to achieve stands using single UE assist. Pt will continue to benefit from skilled PT to address strength, balance and functional mobility deficits.     PT Frequency  2x / week    PT Duration  8 weeks    PT Treatment/Interventions  ADLs/Self Care Home Management;Aquatic Therapy;Therapeutic exercise;Therapeutic activities;Functional mobility training;Stair training;Gait training;Balance training;Neuromuscular re-education;Patient/family education    PT Next Visit Plan  Pt to go to [facility] for [employer] - begin balance HEP; do Berg balance test - give balance exercise pictures for HEP    PT Home Exercise Plan   balance exercises     Consulted and Agree with Plan of Care  Patient       Patient will benefit from skilled therapeutic intervention in order to improve the following deficits and impairments:  Abnormal gait, Decreased balance, Decreased strength, Impaired sensation, Pain, Decreased activity tolerance  Visit Diagnosis: Other abnormalities of gait and mobility  Unsteadiness on feet     Problem List Patient Active Problem List   Diagnosis Date Noted  . SBO (small bowel obstruction) ([place]) 07/13/2017    Floreen Comber, SPT 10/30/2018, 4:00 PM   This entire session was performed under direct supervision and direction of a licensed therapist assistant . The therapist was present and actively participating. I have personally read, edited and approve of the note as written. Guido Sander, PT 10-31-18/3:18 PM  [facility] [address] [place], [place], [address] Phone: [phone]   Fax:  [phone]  Name: Ethan Johnson MRN: [id number] Date of  Birth: July 18, 1946

## 2018-10-30 NOTE — Patient Instructions (Signed)
  Aquatic Therapy: What to Expect!  Where:  [facility] [address] [place], [place]  [address] [phone]  NOTE:  You will receive an automated phone message reminding you of your appointment and it will say the appointment is at the Re[facility] on [address]  We are working to fix this- just know that you will meet us at the pool!  How to Prepare: . Please make sure you drink 8 ounces of water about one hour prior to your pool session . A caregiver MUST attend the entire session with the patient.  The caregiver will be responsible for assisting with dressing as well as any toileting needs.  If the patient will be doing a home program this should likely be the person who will assist as well.  . Patients must wear either their street shoes or pool shoes until they are ready to enter the pool with the therapist.  Patients must also wear either street shoes or pool shoes once exiting the pool to walk to the locker room.  This will helps us prevent slips and falls.  . Please arrive 15 minutes early to prepare for your pool therapy session . Sign in at the front desk on the clipboard marked for [place] . You may use the locker rooms on your right and then enter directly into the recreation pool (NOT the competition pool) . Please make sure to attend to any toileting needs prior to entering the pool . Please be dressed in your swim suit and on the pool deck at least 5 minutes before your appointment . Once on the pool deck your therapist will ask you to sign the Patient  Consent and Assignment of Benefits form . Your therapist may take your blood pressure prior to, during and after your session if indicated  About the pool: 1. Entering the pool Your therapist will assist you; there are multiple ways to enter including stairs with railings, a walk in ramp, a roll in chair and a mechanical lift. Your therapist will determine the most appropriate way for you. 2. Water  temperature is usually between 86-87 degrees 3. There may be other swimmers in the pool at the same time   Contact Info:     Appointments: [facility]  All sessions are 45 minutes   [address]  [address]     Please call the [facility] if   [place], [place]   [address]    you need to cancel or reschedule an appointment.  [phone]       

## 2018-11-25 ENCOUNTER — Ambulatory Visit: Payer: No Typology Code available for payment source | Attending: Internal Medicine | Admitting: Physical Therapy

## 2018-11-25 DIAGNOSIS — M6281 Muscle weakness (generalized): Secondary | ICD-10-CM | POA: Insufficient documentation

## 2018-11-25 DIAGNOSIS — R2689 Other abnormalities of gait and mobility: Secondary | ICD-10-CM | POA: Insufficient documentation

## 2018-11-25 DIAGNOSIS — R2681 Unsteadiness on feet: Secondary | ICD-10-CM | POA: Insufficient documentation

## 2018-11-26 NOTE — Therapy (Signed)
[facility] [address] [place], [place], [address] Phone: [phone]   Fax:  [phone]  Physical Therapy Treatment  Patient Details  Name: Ethan Johnson MRN: [id number] Date of Birth: Feb 03, 1946 Referring Provider (PT): Dr. Windy Fast   Encounter Date: 11/25/2018  PT End of Session - 11/26/18 1904    Visit Number  8    Number of Visits  15    Date for PT Re-Evaluation  11/22/18    Authorization Type  [facility]    Authorization Time Period  09-22-18 - 01-22-19    Authorization - Visit Number  8    Authorization - Number of Visits  15    PT Start Time  1500    PT Stop Time  1545    PT Time Calculation (min)  45 min       Past Medical History:  Diagnosis Date  . BPH (benign prostatic hyperplasia)   . Diabetes mellitus without complication ([place])     No past surgical history on file.  There were no vitals filed for this visit.  Subjective Assessment - 11/26/18 1901    Subjective  Pt presents for aquatic therapy at [facility] - continues to use rollator for assistance with ambulation; pt is unattended during session    Pertinent History  DM with neuropathy    Limitations  Walking    How long can you stand comfortably?  approx. 10"     Patient Stated Goals  get rid of some of the pain in my left leg and be able to walk again without the walker    Currently in Pain?  Yes    Pain Score  9     Pain Location  Leg    Pain Orientation  Left    Pain Descriptors / Indicators  Nagging;Aching    Pain Onset  More than a month ago    Pain Frequency  Constant      Aquatic therapy - pool temp 87.2 degrees  Patient seen for aquatic therapy today.  Treatment took place in water 3.5-4 feet deep depending upon activity.  Pt entered and  Exited the pool via step negotiation modified independently with use of hand rails.  Pt performed stretching for bil. Hamstrings and heel cords - runner's stretch for each leg -30 sec hold;  Heel  cord stretch with toes on wall - 30 sec hold bil. LE's  Pt performed strengthening exercises with use of ankle flotation buoy weight - hip flexion, extension, abduction 10 reps each leg with 1 UE support on pool edge Marching in place 10 reps each with cues for erect posture  Pt performed water walking - forwards, backwards and sideways approx. 50' x  2 reps each direction Squats x 10 reps with UE support Heel raises x 10 reps    Pt in supine position with use of head support and noodles for floatation  - gentle swaying/rocking side to side for relaxation and pain reduction  Pt performed hip abduction/adduction x 10 reps; bicycling LE's 10 reps each leg  sidestepping with squats 50' across pool x 2 reps for LE strengthening and for improved core stabilization  Pt requires aquatic therapy due to difficulty tolerating exercises on land due to pain; buoyancy of water allows him to move freely with signfiicantly reduced pain Pt requires the buoyancy of water for support and off loading of joints for improved posture and movement without excess stress & pain  PT Short Term Goals - 10/30/18 1302      PT SHORT TERM GOAL #1   Title  Pt will perform sit to stand 5 reps with 1 UE support from mat to demo improved LE strength.    Baseline  11/7: Pt performs 5 STS from edge of mat table with single UE assist. Goal partially met 2/2 to heavy reliance on posterior thighs on mat to achieve stand    Time  4    Period  Weeks    Status  Partially Met      PT SHORT TERM GOAL #2   Title  Increase gait velocity from .67 ft/sec to >/= 1.0 ft/sec with RW for incr. gait efficiency.    Baseline  11/7: 2.08 ft/sec with 4WW indicative of limited community ambulator    Time  4    Period  Weeks    Status  Achieved      PT SHORT TERM GOAL #3   Title  Improve TUG score from 1" 8 secs with RW to </= 48 secs with RW to demo improved mobility.    Baseline  11/7: Pt  performs TUG in 23.25 seconds indicative of high fall risk potential     Time  4    Period  Weeks    Status  Achieved      PT SHORT TERM GOAL #4   Title  Pt wll amb. 300' with RW on flat, even surface with SBA for increased community accessibility.    Baseline  11/7: Pt ambulates 300 feet on level surfaces with 4WW with supervision     Time  4    Period  Weeks    Status  Achieved      PT SHORT TERM GOAL #5   Title  Independent in HEP for balance and strengthening.     Time  4    Period  Weeks    Status  Deferred        PT Long Term Goals - 10/30/18 1305      PT LONG TERM GOAL #1   Title  Perform 1 rep of sit to stand transfer from mat without UE support to demo increased LE strength.    Time  8    Period  Weeks    Status  New      PT LONG TERM GOAL #2   Title  Improve TUG score to </=18 seconds with RW to demo improved functional mobility.    Baseline  11/7; 23.25 seconds with 4WW    Time  8    Period  Weeks    Status  Revised      PT LONG TERM GOAL #3   Title  Pt will be independent in aquatic exercise program to continue independently upon D/C from PT, pending authorization from [place] for aquatic therapy.    Time  8    Period  Weeks    Status  New      PT LONG TERM GOAL #4   Title  Increase gait velocity from .67 ft/sec to >/= 2.62 ft/sec with RW for incr. gait efficiency.    Baseline  11/7: 2.08 ft/sec with 4WW    Time  8    Period  Weeks    Status  Revised      PT LONG TERM GOAL #5   Title  Pt will amb. 500' with RW with SBA on flat, even surface for incr. community accessibility.    Time  8  Period  Weeks    Status  New            Plan - 11/26/18 1907    Clinical Impression Statement  Pt tolerated aquatic exercise very well with pt reporting very minimal pain experienced when in the water; pt reported pain 9/10 on land prior to entering pool (pain in back and in LLE):  pt needed UE support initially on edge of pool but balance improved with practice  and repetition     Rehab Potential  Good    PT Frequency  2x / week    PT Duration  8 weeks    PT Treatment/Interventions  ADLs/Self Care Home Management;Aquatic Therapy;Therapeutic exercise;Therapeutic activities;Functional mobility training;Stair training;Gait training;Balance training;Neuromuscular re-education;Patient/family education    PT Next Visit Plan  continue aquatic therapy    PT Home Exercise Plan  balance exercises        Patient will benefit from skilled therapeutic intervention in order to improve the following deficits and impairments:  Abnormal gait, Decreased balance, Decreased strength, Impaired sensation, Pain, Decreased activity tolerance  Visit Diagnosis: Other abnormalities of gait and mobility  Unsteadiness on feet  Muscle weakness (generalized)     Problem List Patient Active Problem List   Diagnosis Date Noted  . SBO (small bowel obstruction) ([place]) 07/13/2017    Treon Kehl, [place], PT 11/26/2018, 7:12 PM  [facility] [address] [place], [place], [address] Phone: [phone]   Fax:  [phone]  Name: Ethan Johnson MRN: [id number] Date of Birth: 1946-05-24

## 2018-12-02 ENCOUNTER — Ambulatory Visit: Payer: No Typology Code available for payment source | Admitting: Physical Therapy

## 2018-12-09 ENCOUNTER — Ambulatory Visit: Payer: No Typology Code available for payment source | Admitting: Physical Therapy

## 2018-12-09 DIAGNOSIS — R2689 Other abnormalities of gait and mobility: Secondary | ICD-10-CM | POA: Diagnosis not present

## 2018-12-09 DIAGNOSIS — R2681 Unsteadiness on feet: Secondary | ICD-10-CM

## 2018-12-09 DIAGNOSIS — M6281 Muscle weakness (generalized): Secondary | ICD-10-CM

## 2018-12-10 NOTE — Therapy (Signed)
[facility] [address] [place], [place], [address] Phone: [phone]   Fax:  [phone]  Physical Therapy Treatment  Patient Details  Name: Ethan Johnson MRN: [id number] Date of Birth: 1946-06-08 Referring Provider (PT): Dr. Windy Fast   Encounter Date: 12/09/2018  PT End of Session - 12/10/18 2221    Visit Number  9    Number of Visits  15    Date for PT Re-Evaluation  01/22/19    Authorization Type  [facility]    Authorization Time Period  09-22-18 - 01-22-19    Authorization - Visit Number  9    Authorization - Number of Visits  15    PT Start Time  1500    PT Stop Time  1545    PT Time Calculation (min)  45 min       Past Medical History:  Diagnosis Date  . BPH (benign prostatic hyperplasia)   . Diabetes mellitus without complication ([place])     No past surgical history on file.  There were no vitals filed for this visit.  Subjective Assessment - 12/10/18 2220    Subjective  Pt reports Rt leg is sore and stiff from his fall last week - pt presents to [facility] for aquatic therapy    Patient Stated Goals  get rid of some of the pain in my left leg and be able to walk again without the walker    Currently in Pain?  Yes    Pain Score  9     Pain Location  Leg    Pain Orientation  Right;Left    Pain Descriptors / Indicators  Aching;Nagging    Pain Type  Chronic pain    Pain Onset  More than a month ago    Pain Frequency  Constant              Aquatic therapy - pool temp 87.4 degrees  Patient seen for aquatic therapy today.  Treatment took place in water 3.5-4 feet deep depending upon activity.  Pt entered and  Exited the pool via step negotiation modified independently with use of hand rails.  Pt performed stretching for bil. Hamstrings and heel cords - runner's stretch for each leg -30 sec hold;  Heel cord stretch with toes on wall - 30 sec hold bil. LE's  Pt performed  strengthening exercises with use of ankle flotation buoy weight - hip flexion, extension, abduction 10 reps each leg with 1 UE support on pool edge Marching in place 10 reps each with cues for erect posture  Pt performed water walking - forwards, backwards and sideways approx. 50' x  2 reps each direction Squats x 10 reps with UE support Heel raises x 10 reps    Pt in supine position with use of head support and noodles for floatation  - gentle swaying/rocking side to side for relaxation and pain reduction  Pt performed hip abduction/adduction x 10 reps; bicycling LE's 10 reps each leg  sidestepping with squats 50' across pool x 2 reps for LE strengthening and for improved core stabilization  Pt requires aquatic therapy due to difficulty tolerating exercises on land due to pain; buoyancy of water allows him to move freely with signfiicantly reduced pain Pt requires the buoyancy of water for support and off loading of joints for improved posture and movement without excess stress & pain  PT Short Term Goals - 10/30/18 1302      PT SHORT TERM GOAL #1   Title  Pt will perform sit to stand 5 reps with 1 UE support from mat to demo improved LE strength.    Baseline  11/7: Pt performs 5 STS from edge of mat table with single UE assist. Goal partially met 2/2 to heavy reliance on posterior thighs on mat to achieve stand    Time  4    Period  Weeks    Status  Partially Met      PT SHORT TERM GOAL #2   Title  Increase gait velocity from .67 ft/sec to >/= 1.0 ft/sec with RW for incr. gait efficiency.    Baseline  11/7: 2.08 ft/sec with 4WW indicative of limited community ambulator    Time  4    Period  Weeks    Status  Achieved      PT SHORT TERM GOAL #3   Title  Improve TUG score from 1" 8 secs with RW to </= 48 secs with RW to demo improved mobility.    Baseline  11/7: Pt performs TUG in 23.25 seconds indicative of high fall risk potential      Time  4    Period  Weeks    Status  Achieved      PT SHORT TERM GOAL #4   Title  Pt wll amb. 300' with RW on flat, even surface with SBA for increased community accessibility.    Baseline  11/7: Pt ambulates 300 feet on level surfaces with 4WW with supervision     Time  4    Period  Weeks    Status  Achieved      PT SHORT TERM GOAL #5   Title  Independent in HEP for balance and strengthening.     Time  4    Period  Weeks    Status  Deferred        PT Long Term Goals - 10/30/18 1305      PT LONG TERM GOAL #1   Title  Perform 1 rep of sit to stand transfer from mat without UE support to demo increased LE strength.    Time  8    Period  Weeks    Status  New      PT LONG TERM GOAL #2   Title  Improve TUG score to </=18 seconds with RW to demo improved functional mobility.    Baseline  11/7; 23.25 seconds with 4WW    Time  8    Period  Weeks    Status  Revised      PT LONG TERM GOAL #3   Title  Pt will be independent in aquatic exercise program to continue independently upon D/C from PT, pending authorization from [place] for aquatic therapy.    Time  8    Period  Weeks    Status  New      PT LONG TERM GOAL #4   Title  Increase gait velocity from .67 ft/sec to >/= 2.62 ft/sec with RW for incr. gait efficiency.    Baseline  11/7: 2.08 ft/sec with 4WW    Time  8    Period  Weeks    Status  Revised      PT LONG TERM GOAL #5   Title  Pt will amb. 500' with RW with SBA on flat, even surface for incr. community accessibility.    Time  8  Period  Weeks    Status  New            Plan - 12/10/18 2223    Clinical Impression Statement  Pt reports approx. 50% decrease in pain in water compared to pain intensity experienced on land; pt tolerating aquatic exercise well     Rehab Potential  Good    PT Frequency  2x / week    PT Duration  8 weeks    PT Treatment/Interventions  ADLs/Self Care Home Management;Aquatic Therapy;Therapeutic exercise;Therapeutic  activities;Functional mobility training;Stair training;Gait training;Balance training;Neuromuscular re-education;Patient/family education    PT Next Visit Plan  continue aquatic therapy    PT Home Exercise Plan  balance exercises     Consulted and Agree with Plan of Care  Patient       Patient will benefit from skilled therapeutic intervention in order to improve the following deficits and impairments:  Abnormal gait, Decreased balance, Decreased strength, Impaired sensation, Pain, Decreased activity tolerance  Visit Diagnosis: Other abnormalities of gait and mobility  Muscle weakness (generalized)  Unsteadiness on feet     Problem List Patient Active Problem List   Diagnosis Date Noted  . SBO (small bowel obstruction) ([place]) 07/13/2017    Ethan Johnson, [place], PT 12/10/2018, 10:25 PM  [facility] [address] [place], [place], [address] Phone: [phone]   Fax:  [phone]  Name: Ethan Johnson MRN: [id number] Date of Birth: 1946/06/25

## 2018-12-22 DIAGNOSIS — I10 Essential (primary) hypertension: Secondary | ICD-10-CM | POA: Diagnosis not present

## 2018-12-22 DIAGNOSIS — E1151 Type 2 diabetes mellitus with diabetic peripheral angiopathy without gangrene: Secondary | ICD-10-CM | POA: Diagnosis not present

## 2018-12-29 ENCOUNTER — Ambulatory Visit: Payer: No Typology Code available for payment source | Attending: Internal Medicine | Admitting: Physical Therapy

## 2019-01-05 ENCOUNTER — Ambulatory Visit: Payer: No Typology Code available for payment source | Admitting: Physical Therapy

## 2019-03-16 ENCOUNTER — Encounter: Payer: Self-pay | Admitting: Physical Therapy

## 2019-03-16 NOTE — Therapy (Signed)
[facility] [address] [place], [place], [address] Phone: [phone]   Fax:  [phone]  Patient Details  Name: Ethan Johnson MRN: [id number] Date of Birth: June 02, 1946 Referring Provider:  No ref. provider found  Dr. Windy Fast  Encounter Date: 03/16/2019                PHYSICAL THERAPY DISCHARGE SUMMARY   Pt was seen for 9 visits for OP PT at [facility] with visit dates 09-22-18 - 12-09-18.  Pt was seen for aquatic therapy on 12-09-18 but did not return for any additional aquatic therapy sessions after this date.  Pt is discharged due to not returning to PT as of this time.  Please see progress towards goals as listed below.   PT Short Term Goals - 10/30/18 1302      PT SHORT TERM GOAL #1   Title  Pt will perform sit to stand 5 reps with 1 UE support from mat to demo improved LE strength.    Baseline  11/7: Pt performs 5 STS from edge of mat table with single UE assist. Goal partially met 2/2 to heavy reliance on posterior thighs on mat to achieve stand    Time  4    Period  Weeks    Status  Partially Met      PT SHORT TERM GOAL #2   Title  Increase gait velocity from .67 ft/sec to >/= 1.0 ft/sec with RW for incr. gait efficiency.    Baseline  11/7: 2.08 ft/sec with 4WW indicative of limited community ambulator    Time  4    Period  Weeks    Status  Achieved      PT SHORT TERM GOAL #3   Title  Improve TUG score from 1" 8 secs with RW to </= 48 secs with RW to demo improved mobility.    Baseline  11/7: Pt performs TUG in 23.25 seconds indicative of high fall risk potential     Time  4    Period  Weeks    Status  Achieved      PT SHORT TERM GOAL #4   Title  Pt wll amb. 300' with RW on flat, even surface with SBA for increased community accessibility.    Baseline  11/7: Pt ambulates 300 feet on level surfaces with 4WW with supervision     Time  4    Period  Weeks    Status  Achieved       PT SHORT TERM GOAL #5   Title  Independent in HEP for balance and strengthening.     Time  4    Period  Weeks    Status  Deferred        PT Long Term Goals - 10/30/18 1305      PT LONG TERM GOAL #1   Title  Perform 1 rep of sit to stand transfer from mat without UE support to demo increased LE strength.    Time  8    Period  Weeks    Status  New      PT LONG TERM GOAL #2   Title  Improve TUG score to </=18 seconds with RW to demo improved functional mobility.    Baseline  11/7; 23.25 seconds with 4WW    Time  8    Period  Weeks    Status  Revised      PT LONG TERM GOAL #3  Title  Pt will be independent in aquatic exercise program to continue independently upon D/C from PT, pending authorization from [place] for aquatic therapy.    Time  8    Period  Weeks    Status  New      PT LONG TERM GOAL #4   Title  Increase gait velocity from .67 ft/sec to >/= 2.62 ft/sec with RW for incr. gait efficiency.    Baseline  11/7: 2.08 ft/sec with 4WW    Time  8    Period  Weeks    Status  Revised      PT LONG TERM GOAL #5   Title  Pt will amb. 500' with RW with SBA on flat, even surface for incr. community accessibility.    Time  8    Period  Weeks    Status  New       Jaremy Nosal, [place], [place] 03/16/2019, 3:21 PM  [facility] [address] [place], [place], [address] Phone: [phone]   Fax:  [phone]

## 2019-08-28 ENCOUNTER — Other Ambulatory Visit: Payer: Self-pay | Admitting: Family

## 2019-09-25 ENCOUNTER — Other Ambulatory Visit: Payer: Self-pay | Admitting: Family

## 2019-09-25 DIAGNOSIS — M542 Cervicalgia: Secondary | ICD-10-CM

## 2019-09-25 DIAGNOSIS — M545 Low back pain, unspecified: Secondary | ICD-10-CM

## 2019-09-25 DIAGNOSIS — M549 Dorsalgia, unspecified: Secondary | ICD-10-CM

## 2019-10-11 ENCOUNTER — Other Ambulatory Visit: Payer: No Typology Code available for payment source

## 2019-10-11 ENCOUNTER — Other Ambulatory Visit: Payer: Self-pay

## 2019-10-11 ENCOUNTER — Ambulatory Visit
Admission: RE | Admit: 2019-10-11 | Discharge: 2019-10-11 | Disposition: A | Discharge status: Home / Self Care | Payer: Medicare Other | Source: Ambulatory Visit | Attending: Family | Admitting: Family

## 2019-10-11 DIAGNOSIS — M549 Dorsalgia, unspecified: Secondary | ICD-10-CM

## 2019-10-27 ENCOUNTER — Other Ambulatory Visit: Payer: Self-pay | Admitting: Family

## 2019-10-27 DIAGNOSIS — E278 Other specified disorders of adrenal gland: Secondary | ICD-10-CM

## 2019-11-30 ENCOUNTER — Other Ambulatory Visit: Payer: Self-pay

## 2019-11-30 ENCOUNTER — Ambulatory Visit
Admission: RE | Admit: 2019-11-30 | Discharge: 2019-11-30 | Disposition: A | Discharge status: Home / Self Care | Payer: Medicare Other | Source: Ambulatory Visit | Attending: Family | Admitting: Family

## 2019-11-30 DIAGNOSIS — E278 Other specified disorders of adrenal gland: Secondary | ICD-10-CM

## 2019-11-30 MED ORDER — GADOBENATE DIMEGLUMINE 529 MG/ML IV SOLN
20.0000 mL | Freq: Once | INTRAVENOUS | Status: AC | PRN
  Administered 2019-11-30: 20 mL via INTRAVENOUS

## 2020-04-11 ENCOUNTER — Encounter: Payer: Self-pay | Admitting: Neurology

## 2020-05-06 ENCOUNTER — Other Ambulatory Visit: Payer: Self-pay

## 2020-05-06 ENCOUNTER — Inpatient Hospital Stay (HOSPITAL_COMMUNITY)
Admission: EM | Admit: 2020-05-06 | Discharge: 2020-05-24 | DRG: 853 | Disposition: E | Payer: No Typology Code available for payment source | Attending: Pulmonary Disease | Admitting: Pulmonary Disease

## 2020-05-06 ENCOUNTER — Emergency Department (HOSPITAL_COMMUNITY): Payer: No Typology Code available for payment source

## 2020-05-06 ENCOUNTER — Encounter (HOSPITAL_COMMUNITY): Payer: Self-pay

## 2020-05-06 DIAGNOSIS — R52 Pain, unspecified: Secondary | ICD-10-CM | POA: Diagnosis not present

## 2020-05-06 DIAGNOSIS — Z7984 Long term (current) use of oral hypoglycemic drugs: Secondary | ICD-10-CM

## 2020-05-06 DIAGNOSIS — Z20822 Contact with and (suspected) exposure to covid-19: Secondary | ICD-10-CM | POA: Diagnosis present

## 2020-05-06 DIAGNOSIS — R188 Other ascites: Secondary | ICD-10-CM | POA: Diagnosis not present

## 2020-05-06 DIAGNOSIS — N390 Urinary tract infection, site not specified: Secondary | ICD-10-CM | POA: Diagnosis not present

## 2020-05-06 DIAGNOSIS — D696 Thrombocytopenia, unspecified: Secondary | ICD-10-CM | POA: Diagnosis not present

## 2020-05-06 DIAGNOSIS — R000 Tachycardia, unspecified: Secondary | ICD-10-CM

## 2020-05-06 DIAGNOSIS — I248 Other forms of acute ischemic heart disease: Secondary | ICD-10-CM | POA: Diagnosis present

## 2020-05-06 DIAGNOSIS — Z9689 Presence of other specified functional implants: Secondary | ICD-10-CM

## 2020-05-06 DIAGNOSIS — Z6831 Body mass index (BMI) 31.0-31.9, adult: Secondary | ICD-10-CM

## 2020-05-06 DIAGNOSIS — Z01818 Encounter for other preprocedural examination: Secondary | ICD-10-CM

## 2020-05-06 DIAGNOSIS — G4700 Insomnia, unspecified: Secondary | ICD-10-CM | POA: Diagnosis present

## 2020-05-06 DIAGNOSIS — A4159 Other Gram-negative sepsis: Principal | ICD-10-CM | POA: Diagnosis present

## 2020-05-06 DIAGNOSIS — E1165 Type 2 diabetes mellitus with hyperglycemia: Secondary | ICD-10-CM | POA: Diagnosis present

## 2020-05-06 DIAGNOSIS — R778 Other specified abnormalities of plasma proteins: Secondary | ICD-10-CM

## 2020-05-06 DIAGNOSIS — I2510 Atherosclerotic heart disease of native coronary artery without angina pectoris: Secondary | ICD-10-CM | POA: Diagnosis present

## 2020-05-06 DIAGNOSIS — T8131XA Disruption of external operation (surgical) wound, not elsewhere classified, initial encounter: Secondary | ICD-10-CM | POA: Diagnosis not present

## 2020-05-06 DIAGNOSIS — N401 Enlarged prostate with lower urinary tract symptoms: Secondary | ICD-10-CM | POA: Diagnosis present

## 2020-05-06 DIAGNOSIS — E874 Mixed disorder of acid-base balance: Secondary | ICD-10-CM | POA: Diagnosis present

## 2020-05-06 DIAGNOSIS — N139 Obstructive and reflux uropathy, unspecified: Secondary | ICD-10-CM | POA: Diagnosis present

## 2020-05-06 DIAGNOSIS — Z515 Encounter for palliative care: Secondary | ICD-10-CM | POA: Diagnosis not present

## 2020-05-06 DIAGNOSIS — Z9911 Dependence on respirator [ventilator] status: Secondary | ICD-10-CM

## 2020-05-06 DIAGNOSIS — N136 Pyonephrosis: Secondary | ICD-10-CM | POA: Diagnosis present

## 2020-05-06 DIAGNOSIS — Z934 Other artificial openings of gastrointestinal tract status: Secondary | ICD-10-CM

## 2020-05-06 DIAGNOSIS — K562 Volvulus: Secondary | ICD-10-CM | POA: Diagnosis present

## 2020-05-06 DIAGNOSIS — K760 Fatty (change of) liver, not elsewhere classified: Secondary | ICD-10-CM | POA: Diagnosis present

## 2020-05-06 DIAGNOSIS — N179 Acute kidney failure, unspecified: Secondary | ICD-10-CM | POA: Diagnosis present

## 2020-05-06 DIAGNOSIS — J95812 Postprocedural air leak: Secondary | ICD-10-CM | POA: Diagnosis not present

## 2020-05-06 DIAGNOSIS — I469 Cardiac arrest, cause unspecified: Secondary | ICD-10-CM | POA: Diagnosis not present

## 2020-05-06 DIAGNOSIS — R0682 Tachypnea, not elsewhere classified: Secondary | ICD-10-CM | POA: Diagnosis not present

## 2020-05-06 DIAGNOSIS — R319 Hematuria, unspecified: Secondary | ICD-10-CM | POA: Diagnosis not present

## 2020-05-06 DIAGNOSIS — I471 Supraventricular tachycardia: Secondary | ICD-10-CM | POA: Diagnosis present

## 2020-05-06 DIAGNOSIS — J9601 Acute respiratory failure with hypoxia: Secondary | ICD-10-CM

## 2020-05-06 DIAGNOSIS — N320 Bladder-neck obstruction: Secondary | ICD-10-CM

## 2020-05-06 DIAGNOSIS — Z7982 Long term (current) use of aspirin: Secondary | ICD-10-CM

## 2020-05-06 DIAGNOSIS — R578 Other shock: Secondary | ICD-10-CM | POA: Diagnosis not present

## 2020-05-06 DIAGNOSIS — Z452 Encounter for adjustment and management of vascular access device: Secondary | ICD-10-CM

## 2020-05-06 DIAGNOSIS — Z87891 Personal history of nicotine dependence: Secondary | ICD-10-CM

## 2020-05-06 DIAGNOSIS — E860 Dehydration: Secondary | ICD-10-CM | POA: Diagnosis not present

## 2020-05-06 DIAGNOSIS — E279 Disorder of adrenal gland, unspecified: Secondary | ICD-10-CM | POA: Diagnosis present

## 2020-05-06 DIAGNOSIS — K650 Generalized (acute) peritonitis: Secondary | ICD-10-CM | POA: Diagnosis not present

## 2020-05-06 DIAGNOSIS — N138 Other obstructive and reflux uropathy: Secondary | ICD-10-CM | POA: Diagnosis present

## 2020-05-06 DIAGNOSIS — K311 Adult hypertrophic pyloric stenosis: Secondary | ICD-10-CM | POA: Diagnosis present

## 2020-05-06 DIAGNOSIS — E669 Obesity, unspecified: Secondary | ICD-10-CM | POA: Diagnosis present

## 2020-05-06 DIAGNOSIS — B961 Klebsiella pneumoniae [K. pneumoniae] as the cause of diseases classified elsewhere: Secondary | ICD-10-CM | POA: Diagnosis present

## 2020-05-06 DIAGNOSIS — K2970 Gastritis, unspecified, without bleeding: Secondary | ICD-10-CM | POA: Diagnosis not present

## 2020-05-06 DIAGNOSIS — R112 Nausea with vomiting, unspecified: Secondary | ICD-10-CM | POA: Diagnosis not present

## 2020-05-06 DIAGNOSIS — R109 Unspecified abdominal pain: Secondary | ICD-10-CM

## 2020-05-06 DIAGNOSIS — K3189 Other diseases of stomach and duodenum: Secondary | ICD-10-CM | POA: Diagnosis not present

## 2020-05-06 DIAGNOSIS — D649 Anemia, unspecified: Secondary | ICD-10-CM | POA: Diagnosis present

## 2020-05-06 DIAGNOSIS — E8770 Fluid overload, unspecified: Secondary | ICD-10-CM | POA: Diagnosis not present

## 2020-05-06 DIAGNOSIS — E870 Hyperosmolality and hypernatremia: Secondary | ICD-10-CM | POA: Diagnosis not present

## 2020-05-06 DIAGNOSIS — R7881 Bacteremia: Secondary | ICD-10-CM | POA: Diagnosis not present

## 2020-05-06 DIAGNOSIS — Z9289 Personal history of other medical treatment: Secondary | ICD-10-CM

## 2020-05-06 DIAGNOSIS — R338 Other retention of urine: Secondary | ICD-10-CM | POA: Diagnosis present

## 2020-05-06 DIAGNOSIS — J939 Pneumothorax, unspecified: Secondary | ICD-10-CM

## 2020-05-06 DIAGNOSIS — E1142 Type 2 diabetes mellitus with diabetic polyneuropathy: Secondary | ICD-10-CM | POA: Diagnosis present

## 2020-05-06 DIAGNOSIS — R008 Other abnormalities of heart beat: Secondary | ICD-10-CM | POA: Diagnosis not present

## 2020-05-06 DIAGNOSIS — I10 Essential (primary) hypertension: Secondary | ICD-10-CM | POA: Diagnosis present

## 2020-05-06 DIAGNOSIS — G931 Anoxic brain damage, not elsewhere classified: Secondary | ICD-10-CM | POA: Diagnosis not present

## 2020-05-06 DIAGNOSIS — I700 Atherosclerosis of aorta: Secondary | ICD-10-CM | POA: Diagnosis present

## 2020-05-06 DIAGNOSIS — Z79899 Other long term (current) drug therapy: Secondary | ICD-10-CM

## 2020-05-06 DIAGNOSIS — N183 Chronic kidney disease, stage 3 unspecified: Secondary | ICD-10-CM | POA: Diagnosis not present

## 2020-05-06 DIAGNOSIS — I4891 Unspecified atrial fibrillation: Secondary | ICD-10-CM | POA: Diagnosis present

## 2020-05-06 DIAGNOSIS — Z993 Dependence on wheelchair: Secondary | ICD-10-CM

## 2020-05-06 DIAGNOSIS — R571 Hypovolemic shock: Secondary | ICD-10-CM | POA: Diagnosis not present

## 2020-05-06 HISTORY — DX: Fatty (change of) liver, not elsewhere classified: K76.0

## 2020-05-06 HISTORY — DX: Atherosclerosis of aorta: I70.0

## 2020-05-06 HISTORY — DX: Polyneuropathy, unspecified: G62.9

## 2020-05-06 HISTORY — DX: Other specified disorders of adrenal gland: E27.8

## 2020-05-06 LAB — CBG MONITORING, ED
Glucose-Capillary: 145 mg/dL — ABNORMAL HIGH (ref 70–99)
Glucose-Capillary: 196 mg/dL — ABNORMAL HIGH (ref 70–99)

## 2020-05-06 LAB — SARS CORONAVIRUS 2 BY RT PCR (HOSPITAL ORDER, PERFORMED IN ~~LOC~~ HOSPITAL LAB): SARS Coronavirus 2: NEGATIVE

## 2020-05-06 LAB — CBC WITH DIFFERENTIAL/PLATELET
Abs Immature Granulocytes: 0.04 10*3/uL (ref 0.00–0.07)
Basophils Absolute: 0 10*3/uL (ref 0.0–0.1)
Basophils Relative: 0 %
Eosinophils Absolute: 0 10*3/uL (ref 0.0–0.5)
Eosinophils Relative: 0 %
HCT: 40.7 % (ref 39.0–52.0)
Hemoglobin: 12.9 g/dL — ABNORMAL LOW (ref 13.0–17.0)
Immature Granulocytes: 0 %
Lymphocytes Relative: 15 %
Lymphs Abs: 1.7 10*3/uL (ref 0.7–4.0)
MCH: 26.6 pg (ref 26.0–34.0)
MCHC: 31.7 g/dL (ref 30.0–36.0)
MCV: 83.9 fL (ref 80.0–100.0)
Monocytes Absolute: 0.1 10*3/uL (ref 0.1–1.0)
Monocytes Relative: 1 %
Neutro Abs: 9.8 10*3/uL — ABNORMAL HIGH (ref 1.7–7.7)
Neutrophils Relative %: 84 %
Platelets: 224 10*3/uL (ref 150–400)
RBC: 4.85 MIL/uL (ref 4.22–5.81)
RDW: 13.9 % (ref 11.5–15.5)
WBC: 11.8 10*3/uL — ABNORMAL HIGH (ref 4.0–10.5)
nRBC: 0 % (ref 0.0–0.2)

## 2020-05-06 LAB — URINALYSIS, ROUTINE W REFLEX MICROSCOPIC
Bilirubin Urine: NEGATIVE
Glucose, UA: NEGATIVE mg/dL
Ketones, ur: 5 mg/dL — AB
Nitrite: NEGATIVE
Protein, ur: 30 mg/dL — AB
RBC / HPF: >50 RBC/hpf — ABNORMAL HIGH (ref 0–5)
Specific Gravity, Urine: 1.015 (ref 1.005–1.030)
WBC, UA: >50 WBC/hpf — ABNORMAL HIGH (ref 0–5)
pH: 7 (ref 5.0–8.0)

## 2020-05-06 LAB — RENAL FUNCTION PANEL
Albumin: 2.9 g/dL — ABNORMAL LOW (ref 3.5–5.0)
Anion gap: 18 — ABNORMAL HIGH (ref 5–15)
BUN: 29 mg/dL — ABNORMAL HIGH (ref 8–23)
CO2: 16 mmol/L — ABNORMAL LOW (ref 22–32)
Calcium: 8.1 mg/dL — ABNORMAL LOW (ref 8.9–10.3)
Chloride: 103 mmol/L (ref 98–111)
Creatinine, Ser: 3.47 mg/dL — ABNORMAL HIGH (ref 0.61–1.24)
GFR calc Af Amer: 19 mL/min — ABNORMAL LOW (ref >60)
GFR calc non Af Amer: 16 mL/min — ABNORMAL LOW (ref >60)
Glucose, Bld: 134 mg/dL — ABNORMAL HIGH (ref 70–99)
Phosphorus: 5.1 mg/dL — ABNORMAL HIGH (ref 2.5–4.6)
Potassium: 4.2 mmol/L (ref 3.5–5.1)
Sodium: 137 mmol/L (ref 135–145)

## 2020-05-06 LAB — TROPONIN I (HIGH SENSITIVITY)
Troponin I (High Sensitivity): 417 ng/L (ref <18)
Troponin I (High Sensitivity): 515 ng/L (ref <18)
Troponin I (High Sensitivity): 453 ng/L (ref <18)
Troponin I (High Sensitivity): 428 ng/L (ref <18)

## 2020-05-06 LAB — GLUCOSE, CAPILLARY: Glucose-Capillary: 219 mg/dL — ABNORMAL HIGH (ref 70–99)

## 2020-05-06 LAB — HEMOGLOBIN A1C
Hgb A1c MFr Bld: 7.6 % — ABNORMAL HIGH (ref 4.8–5.6)
Mean Plasma Glucose: 171.42 mg/dL

## 2020-05-06 LAB — COMPREHENSIVE METABOLIC PANEL
ALT: 24 U/L (ref 0–44)
AST: 42 U/L — ABNORMAL HIGH (ref 15–41)
Albumin: 3.8 g/dL (ref 3.5–5.0)
Alkaline Phosphatase: 102 U/L (ref 38–126)
Anion gap: 22 — ABNORMAL HIGH (ref 5–15)
BUN: 25 mg/dL — ABNORMAL HIGH (ref 8–23)
CO2: 16 mmol/L — ABNORMAL LOW (ref 22–32)
Calcium: 9.2 mg/dL (ref 8.9–10.3)
Chloride: 101 mmol/L (ref 98–111)
Creatinine, Ser: 2.74 mg/dL — ABNORMAL HIGH (ref 0.61–1.24)
GFR calc Af Amer: 25 mL/min — ABNORMAL LOW (ref >60)
GFR calc non Af Amer: 22 mL/min — ABNORMAL LOW (ref >60)
Glucose, Bld: 175 mg/dL — ABNORMAL HIGH (ref 70–99)
Potassium: 4.2 mmol/L (ref 3.5–5.1)
Sodium: 139 mmol/L (ref 135–145)
Total Bilirubin: 1.3 mg/dL — ABNORMAL HIGH (ref 0.3–1.2)
Total Protein: 7.2 g/dL (ref 6.5–8.1)

## 2020-05-06 LAB — PROCALCITONIN: Procalcitonin: >150 ng/mL

## 2020-05-06 LAB — LIPASE, BLOOD: Lipase: 24 U/L (ref 11–51)

## 2020-05-06 LAB — LACTIC ACID, PLASMA
Lactic Acid, Venous: 6 mmol/L (ref 0.5–1.9)
Lactic Acid, Venous: 6.7 mmol/L (ref 0.5–1.9)
Lactic Acid, Venous: 7.6 mmol/L (ref 0.5–1.9)
Lactic Acid, Venous: 6.4 mmol/L (ref 0.5–1.9)

## 2020-05-06 LAB — TSH: TSH: 2.236 u[IU]/mL (ref 0.350–4.500)

## 2020-05-06 LAB — PSA: Prostatic Specific Antigen: 134.28 ng/mL — ABNORMAL HIGH (ref 0.00–4.00)

## 2020-05-06 MED ORDER — SODIUM CHLORIDE 0.9 % IV SOLN
2.0000 g | INTRAVENOUS | Status: DC
  Administered 2020-05-07 – 2020-05-09 (×3): 2 g via INTRAVENOUS
  Filled 2020-05-06 (×3): qty 20

## 2020-05-06 MED ORDER — HYDROMORPHONE HCL 1 MG/ML IJ SOLN
1.0000 mg | INTRAMUSCULAR | Status: DC | PRN
  Administered 2020-05-06 – 2020-05-14 (×26): 1 mg via INTRAVENOUS
  Filled 2020-05-06 (×28): qty 1

## 2020-05-06 MED ORDER — ACETAMINOPHEN 325 MG PO TABS
650.0000 mg | ORAL_TABLET | Freq: Four times a day (QID) | ORAL | Status: DC | PRN
  Administered 2020-05-07 – 2020-05-11 (×7): 650 mg via ORAL
  Filled 2020-05-06 (×8): qty 2

## 2020-05-06 MED ORDER — SODIUM CHLORIDE 0.9 % IV SOLN
2.0000 g | Freq: Once | INTRAVENOUS | Status: AC
  Administered 2020-05-06: 2 g via INTRAVENOUS
  Filled 2020-05-06: qty 20

## 2020-05-06 MED ORDER — AMIODARONE HCL IN DEXTROSE 360-4.14 MG/200ML-% IV SOLN: 30.0000 mg/h | INTRAVENOUS | Status: DC

## 2020-05-06 MED ORDER — AMIODARONE LOAD VIA INFUSION
150.0000 mg | Freq: Once | INTRAVENOUS | Status: DC
  Filled 2020-05-06: qty 83.34

## 2020-05-06 MED ORDER — TAMSULOSIN HCL 0.4 MG PO CAPS
0.4000 mg | ORAL_CAPSULE | Freq: Every day | ORAL | Status: DC
  Administered 2020-05-07 – 2020-05-11 (×5): 0.4 mg via ORAL
  Filled 2020-05-06 (×5): qty 1

## 2020-05-06 MED ORDER — HEPARIN SODIUM (PORCINE) 5000 UNIT/ML IJ SOLN
5000.0000 [IU] | Freq: Three times a day (TID) | INTRAMUSCULAR | Status: DC
  Administered 2020-05-06 – 2020-05-14 (×21): 5000 [IU] via SUBCUTANEOUS
  Filled 2020-05-06 (×21): qty 1

## 2020-05-06 MED ORDER — FERROUS SULFATE 325 (65 FE) MG PO TABS
325.0000 mg | ORAL_TABLET | Freq: Every day | ORAL | Status: DC
  Administered 2020-05-07 – 2020-05-11 (×5): 325 mg via ORAL
  Filled 2020-05-06 (×6): qty 1

## 2020-05-06 MED ORDER — FOLIC ACID 1 MG PO TABS
1.0000 mg | ORAL_TABLET | Freq: Every day | ORAL | Status: DC
  Administered 2020-05-07 – 2020-05-11 (×5): 1 mg via ORAL
  Filled 2020-05-06 (×6): qty 1

## 2020-05-06 MED ORDER — INSULIN ASPART 100 UNIT/ML ~~LOC~~ SOLN
0.0000 [IU] | Freq: Every day | SUBCUTANEOUS | Status: DC
  Administered 2020-05-06: 2 [IU] via SUBCUTANEOUS

## 2020-05-06 MED ORDER — LACTATED RINGERS IV BOLUS (SEPSIS)
1000.0000 mL | Freq: Once | INTRAVENOUS | Status: AC
  Administered 2020-05-06: 1000 mL via INTRAVENOUS

## 2020-05-06 MED ORDER — POLYETHYLENE GLYCOL 3350 17 G PO PACK: 17.0000 g | PACK | Freq: Every day | ORAL | Status: DC | PRN

## 2020-05-06 MED ORDER — ONDANSETRON HCL 4 MG/2ML IJ SOLN
4.0000 mg | Freq: Four times a day (QID) | INTRAMUSCULAR | Status: DC | PRN
  Administered 2020-05-07 – 2020-05-11 (×3): 4 mg via INTRAVENOUS
  Filled 2020-05-06 (×5): qty 2

## 2020-05-06 MED ORDER — AMIODARONE HCL IN DEXTROSE 360-4.14 MG/200ML-% IV SOLN: 60.0000 mg/h | INTRAVENOUS | Status: DC

## 2020-05-06 MED ORDER — LACTATED RINGERS IV SOLN: INTRAVENOUS | Status: DC

## 2020-05-06 MED ORDER — ATORVASTATIN CALCIUM 40 MG PO TABS
40.0000 mg | ORAL_TABLET | Freq: Every day | ORAL | Status: DC
  Administered 2020-05-06 – 2020-05-12 (×6): 40 mg via ORAL
  Filled 2020-05-06 (×6): qty 1

## 2020-05-06 MED ORDER — CHLORHEXIDINE GLUCONATE CLOTH 2 % EX PADS
6.0000 | MEDICATED_PAD | Freq: Every day | CUTANEOUS | Status: DC
  Administered 2020-05-06 – 2020-05-12 (×7): 6 via TOPICAL

## 2020-05-06 MED ORDER — ASPIRIN 81 MG PO CHEW
81.0000 mg | CHEWABLE_TABLET | Freq: Every day | ORAL | Status: DC
  Administered 2020-05-07 – 2020-05-11 (×5): 81 mg via ORAL
  Filled 2020-05-06 (×6): qty 1

## 2020-05-06 MED ORDER — BELLADONNA ALKALOIDS-OPIUM 16.2-60 MG RE SUPP
0.5000 | Freq: Four times a day (QID) | RECTAL | Status: DC | PRN
  Administered 2020-05-07: 0.5 via RECTAL
  Filled 2020-05-06: qty 1

## 2020-05-06 MED ORDER — LACTATED RINGERS IV BOLUS: 1000.0000 mL | Freq: Once | INTRAVENOUS | Status: DC

## 2020-05-06 MED ORDER — LACTATED RINGERS IV BOLUS (SEPSIS)
500.0000 mL | Freq: Once | INTRAVENOUS | Status: AC
  Administered 2020-05-06: 500 mL via INTRAVENOUS

## 2020-05-06 MED ORDER — VENLAFAXINE HCL ER 75 MG PO CP24
225.0000 mg | ORAL_CAPSULE | Freq: Every day | ORAL | Status: DC
  Administered 2020-05-07 – 2020-05-11 (×5): 225 mg via ORAL
  Filled 2020-05-06: qty 1
  Filled 2020-05-06 (×4): qty 3
  Filled 2020-05-06 (×2): qty 1
  Filled 2020-05-06: qty 3

## 2020-05-06 MED ORDER — AMITRIPTYLINE HCL 10 MG PO TABS
10.0000 mg | ORAL_TABLET | Freq: Every day | ORAL | Status: DC
  Administered 2020-05-06 – 2020-05-12 (×6): 10 mg via ORAL
  Filled 2020-05-06 (×7): qty 1

## 2020-05-06 MED ORDER — PRAZOSIN HCL 1 MG PO CAPS
1.0000 mg | ORAL_CAPSULE | Freq: Every day | ORAL | Status: DC
  Administered 2020-05-06 – 2020-05-12 (×6): 1 mg via ORAL
  Filled 2020-05-06 (×7): qty 1

## 2020-05-06 MED ORDER — SODIUM CHLORIDE 0.9 % IV BOLUS
1000.0000 mL | Freq: Once | INTRAVENOUS | Status: AC
  Administered 2020-05-06: 1000 mL via INTRAVENOUS

## 2020-05-06 MED ORDER — ADENOSINE 6 MG/2ML IV SOLN
6.0000 mg | Freq: Once | INTRAVENOUS | Status: AC
  Administered 2020-05-06: 6 mg via INTRAVENOUS
  Filled 2020-05-06: qty 2

## 2020-05-06 MED ORDER — ACETAMINOPHEN 650 MG RE SUPP
650.0000 mg | Freq: Four times a day (QID) | RECTAL | Status: DC | PRN
  Administered 2020-05-13: 650 mg via RECTAL
  Filled 2020-05-06: qty 1

## 2020-05-06 MED ORDER — ONDANSETRON HCL 4 MG PO TABS: 4.0000 mg | ORAL_TABLET | Freq: Four times a day (QID) | ORAL | Status: DC | PRN

## 2020-05-06 MED ORDER — INSULIN ASPART 100 UNIT/ML ~~LOC~~ SOLN
0.0000 [IU] | Freq: Three times a day (TID) | SUBCUTANEOUS | Status: DC
  Administered 2020-05-06 – 2020-05-09 (×8): 2 [IU] via SUBCUTANEOUS
  Administered 2020-05-09: 1 [IU] via SUBCUTANEOUS
  Administered 2020-05-09 – 2020-05-10 (×4): 2 [IU] via SUBCUTANEOUS
  Administered 2020-05-11: 1 [IU] via SUBCUTANEOUS
  Administered 2020-05-11: 2 [IU] via SUBCUTANEOUS
  Administered 2020-05-11: 1 [IU] via SUBCUTANEOUS
  Administered 2020-05-12 – 2020-05-13 (×2): 2 [IU] via SUBCUTANEOUS
  Administered 2020-05-13 (×2): 3 [IU] via SUBCUTANEOUS

## 2020-05-06 MED ORDER — BELLADONNA ALKALOIDS-OPIUM 16.2-30 MG RE SUPP: 1.0000 | Freq: Four times a day (QID) | RECTAL | Status: DC | PRN

## 2020-05-06 NOTE — ED Notes (Signed)
No further nausea, MD in for re-evaluation and change of bed status.  Pt incontinent and able to assist with rolling only slightly.  C/o generalized abd pain and LE cramping he relates to his [place] exposure.

## 2020-05-06 NOTE — Consult Note (Addendum)
Cardiology Consultation:   Patient ID: [name] MRN: [id number]; DOB: 07-13-1946  Admit date: 05-27-2020 Date of Consult: 05/27/20  Primary Care Provider: [facility] Primary Cardiologist: New to Dr. Charlton Haws Primary Electrophysiologist:  None    Patient Profile:   Ethan Johnson is a 74 y.o. 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 Blinda Leatherwood.  History of Present Illness:   Mr. Ethan Johnson has no prior cardiac history. He was recently seen by endocrinology on 5/10 for an adrenal mass captured on MRI and is undergoing workup for this. Pulse was 98 at that visit and creatinine was normal.  He presents with abdominal pain for 2-3 days. He was recently evaluated at the [facility] for urinary hesitancy and elevated PSA and was pending biopsies next week. In the last 1-2 days he also developed urinary hesitancy and unable to empty bladder. He then developed progression of his abdominal pain to the point of requiring EMS. He denies any nausea, vomiting, fevers, chills, SOB, chest pain, palpitations, edema or syncope. Labs reveal AKI with BUN 25/Cr 2.75 (05/02/20 Cr was 0.79 per [facility]), lactic acidosis of 6.0, mild leukocytosis at 11.8, mildly elevated AST and Tbili, hsTroponin 417-515, and UA with moderate Hgb, large leuks, some ketones. CT abdomen/pelvis showed overdistended bladder leading to bilateral hydroureteronephrosis, related marked prostate enlargement, hepatic steatosis, aortic atherosclerosis, coronary atherosclerosis. Foley catheter was placed and he put out more than a liter of urine. UCx/blood cx are pending and he's been treated with IV fluids and started on antibiotics. UCx/blood cx are pending and he's  been treated with IV fluids and started on antibiotics.  EMS EKG showed a narrow complex tachycardia, unclear if SVT vs sinus tach. Per EDP report, "Patient was therefore administered adenosine 6 mg IV push and tracing was evaluated.  Heart rate dropped into the 130s with clear P waves followed by QRS complexes indicating sinus tachycardia." It appears around the time the patient was given this, his telemetry strips drop off so question whether this was observed on the Zoll. He remains tachycardic in the 140s, EKG strips generally appear consistent with sinus tachycardia. He also remains tachypneic and reporting abdominal pain.   Past Medical History:  Diagnosis Date   Adrenal incidentaloma (HCC)    Aortic atherosclerosis (HCC)    BPH (benign prostatic hyperplasia)    Diabetes mellitus without complication (HCC)    Hepatic steatosis    Neuropathy     History reviewed. No pertinent surgical history.   Home Medications:  Prior to Admission medications   Medication Sig Start Date End Date Taking? Authorizing Provider  amitriptyline (ELAVIL) 10 MG tablet Take 10 mg by mouth at bedtime.   Yes [provider]  aspirin 81 MG chewable tablet Chew 81 mg by mouth daily.   Yes [provider]  atorvastatin (LIPITOR) 40 MG tablet Take 40 mg by mouth at bedtime.   Yes [provider]  ferrous sulfate 325 (65 FE) MG tablet Take 325 mg by mouth daily with breakfast.   Yes [provider]  folic acid (FOLVITE) 1 MG tablet Take 1 mg by mouth daily.   Yes [provider]  glipiZIDE (GLUCOTROL) 10 MG tablet Take 10 mg by mouth 2 (two) times daily before  a meal.    Yes [provider]  metFORMIN (GLUCOPHAGE) 1000 MG tablet Take 1,000 mg by mouth 2 (two) times daily with a meal.   Yes [provider]  prazosin (MINIPRESS) 1 MG capsule Take 1 mg by mouth at bedtime.   Yes [provider]  tamsulosin (FLOMAX) 0.4 MG CAPS capsule Take 0.4 mg  by mouth daily.   Yes [provider]  venlafaxine XR (EFFEXOR-XR) 75 MG 24 hr capsule Take 225 mg by mouth daily with breakfast.   Yes [provider]  methocarbamol (ROBAXIN) 500 MG tablet Take 1 tablet (500 mg total) by mouth at bedtime and may repeat dose one time if needed. Patient not taking: Reported on 2020/05/29 07/13/17   Bethel Born, PA-C    Inpatient Medications: Scheduled Meds:   Continuous Infusions:  cefTRIAXone (ROCEPHIN)  IV     lactated ringers Stopped (29-May-2020 0612)   lactated ringers     And   lactated ringers     lactated ringers     PRN Meds:   Allergies:   No Known Allergies  Social History:   Social History   Socioeconomic History   Marital status: Married    Spouse name: Not on file   Number of children: Not on file   Years of education: Not on file   Highest education level: Not on file  Occupational History   Not on file  Tobacco Use   Smoking status: Former Smoker   Smokeless tobacco: Never Used   Tobacco comment: Smoked age [age]  Substance and Sexual Activity   Alcohol use: No   Drug use: No   Sexual activity: Not on file  Other Topics Concern   Not on file  Social History Narrative   Not on file   Social Determinants of Health   Financial Resource Strain:    Difficulty of Paying Living Expenses:   Food Insecurity:    Worried About Programme researcher, broadcasting/film/video in the Last Year:    Barista in the Last Year:   Transportation Needs:    Freight forwarder (Medical):    Lack of Transportation (Non-Medical):   Physical Activity:    Days of Exercise per Week:    Minutes of Exercise per Session:   Stress:    Feeling of Stress :   Social Connections:    Frequency of Communication with Friends and Family:    Frequency of Social Gatherings with Friends and Family:    Attends Religious Services:    Active Member of Clubs or Organizations:    Attends Engineer, structural:    Marital Status:    Intimate Partner Violence:    Fear of Current or Ex-Partner:    Emotionally Abused:    Physically Abused:    Sexually Abused:     Family History:    Family History  Problem Relation Age of Onset   CAD Neg Hx      ROS:  Please see the history of present illness.  All other ROS reviewed and negative.     Physical Exam/Data:   Vitals:   05-29-2020 0645 May 29, 2020 0700 05/29/2020 0715 May 29, 2020 0800  BP: 112/64 103/60 (!) 114/57 122/74  Pulse: (!) 153 (!) 147 (!) 148 (!) 144  Resp: (!) 37 (!) 36 (!) 36 (!) 32  Temp:      TempSrc:      SpO2: 97% 97% 98% 98%  Weight:      Height:  Intake/Output Summary (Last 24 hours) at 05/16/2020 0833 Last data filed at 05/21/2020 0703 Gross per 24 hour  Intake 3000 ml  Output 1000 ml  Net 2000 ml   Last 3 Weights 04/24/2020 07/13/2017 07/13/2017  Weight (lbs) 220 lb 14.4 oz 221 lb 221 lb  Weight (kg) 100.2 kg 100.245 kg 100.245 kg     Body mass index is 31.7 kg/m.  General: Ill appearing obese AAM, uncomfortable appearing Head: Normocephalic, atraumatic, sclera non-icteric, no xanthomas, nares are without discharge. Neck: Negative for carotid bruits. JVP not elevated. Lungs: Clear bilaterally to auscultation without wheezes, rales, or rhonchi but breathing is tachypneic Heart: Regular rhythm, tachycardic, S1 S2 without murmurs, rubs, or gallops.  Abdomen: Rounded/distended with suprapubic tenderness, no upper abdominal tenderness, normoactive bowel sounds. Patient indicates diffuse discomfort Extremities: No clubbing or cyanosis. No edema. Distal pedal pulses are 2+ and equal bilaterally. Neuro: Alert and oriented X 3. Moves all extremities spontaneously. Psych:  Responds to questions appropriately with a normal affect but uncomfortable appearing  EKG:  The EKG was personally reviewed and demonstrates: Personally reviewed both tracings. Appear to show sinus tachycardia at 158bpm with subtle ST sagging in V5-V6. Computer readout  erroneously calling this atrial fibrillation.  Telemetry:  Telemetry commented below  Relevant CV Studies: N/A  Laboratory Data:  High Sensitivity Troponin:   Recent Labs  Lab 04/28/2020 0239 05/09/2020 0439  TROPONINIHS 417* 515*     Chemistry Recent Labs  Lab 05/07/2020 0239  NA 139  K 4.2  CL 101  CO2 16*  GLUCOSE 175*  BUN 25*  CREATININE 2.74*  CALCIUM 9.2  GFRNONAA 22*  GFRAA 25*  ANIONGAP 22*    Recent Labs  Lab 04/23/2020 0239  PROT 7.2  ALBUMIN 3.8  AST 42*  ALT 24  ALKPHOS 102  BILITOT 1.3*   Hematology Recent Labs  Lab 04/28/2020 0239  WBC 11.8*  RBC 4.85  HGB 12.9*  HCT 40.7  MCV 83.9  MCH 26.6  MCHC 31.7  RDW 13.9  PLT 224   BNPNo results for input(s): BNP, PROBNP in the last 168 hours.  DDimer No results for input(s): DDIMER in the last 168 hours.   Radiology/Studies:  CT ABDOMEN PELVIS WO CONTRAST  Result Date: 05/20/2020 CLINICAL DATA:  Unspecified abdominal pain EXAM: CT ABDOMEN AND PELVIS WITHOUT CONTRAST TECHNIQUE: Multidetector CT imaging of the abdomen and pelvis was performed following the standard protocol without IV contrast. COMPARISON:  Abdominal MRI 11/30/2019 FINDINGS: Lower chest:  Coronary atherosclerosis. Hepatobiliary: Hepatic steatosis. There is motion artifact.No evidence of biliary obstruction or stone. Pancreas: Unremarkable. Spleen: Unremarkable. Adrenals/Urinary Tract: 3.4 cm left adrenal mass characterized as adenoma on 2020 abdominal MRI. Distended urinary bladder with associated bilateral hydroureteronephrosis and prominent perinephric stranding. No obstructing stone. Stomach/Bowel:  No obstruction. No visible bowel inflammation. Vascular/Lymphatic: No acute vascular abnormality. Aortic atherosclerosis. No mass or adenopathy. Reproductive:Marked enlargement of the prostate which uplifts the bladder and measures 9 cm craniocaudal. Other: No ascites or pneumoperitoneum. Musculoskeletal: No acute abnormalities. Severe  bilateral hip osteoarthritis. Generalized spondylosis. IMPRESSION: 1. Over distended bladder leading to bilateral hydroureteronephrosis. Related marked prostate enlargement with 10 cm craniocaudal span. 2. Hepatic steatosis. 3.  Aortic Atherosclerosis (ICD10-I70.0). Coronary atherosclerosis. 4. Motion artifact. Electronically Signed   By: Monte Fantasia M.D.   On: 05/08/2020 06:21   DG ABD ACUTE 2+V W 1V CHEST  Result Date: 04/25/2020 CLINICAL DATA:  Abdominal pain, distension EXAM: DG ABDOMEN ACUTE W/ 1V CHEST COMPARISON:  11/30/2019 FINDINGS: Supine and  upright frontal views of the abdomen as well as an upright frontal view of the chest are obtained. Cardiac silhouette is unremarkable. No airspace disease, effusion, or pneumothorax. Bowel gas pattern is unremarkable. No obstruction or ileus. No free gas within the greater peritoneal sac. No masses or abnormal calcifications. IMPRESSION: 1. Unremarkable abdominal series. Electronically Signed   By: Sharlet Salina M.D.   On: 05/10/2020 03:31    Assessment and Plan:   1. Abdominal pain here with AKI, lactic acidosis, and hydroureteronephrosis with marked prostate enlargement - foley cath in place. IM is admitting patient and starting abx. May need urology involvement this admission. Based on how ill he appears we also notified ED attending that critical care involvement may be warranted.  2. Tachycardia - based on strips available and EKGs, tracings are most suggestive of sinus tachycardia likely reflexive to acute medical illness. Per discussion with Dr. Eden Emms, cannot fully exclude atrial tachycardia therefore he recommends IV amiodarone bolus and drip while the patient defervesces. Do not think this represents atrial flutter at this time. We will follow rhythm. Will add TSH for completeness given that his OP HR was 98 as well.  3. Elevated troponin - suspect demand ischemia related to metabolic stressors. Agree with IM plan to follow troponins. Will  plan to get an echo when HR has improved to assess if further workup needed down the road. IM plan has reordered home aspirin/statin. Will get lipids in AM. He is not having any anginal symptoms at present time.  4. Recent adrenal incidentaloma - cannot see other recent labs done by endocrinology, only BMET. Further per IM.  For questions or updates, please contact [facility] Please consult [email or web address] for contact info under    Signed, Laurann Montana, PA-C  05/08/2020 8:33 AM  Patient examined chart reviewed Discussed care with patient, PA and primary service.  Patient appears critically ill. He is tachypnic from acidosis, HCO3 16 lactic acid 6 With tympanitic abdomen shallow respirations and in general obese black male in moderate distress. Rhythm appears to be sinus tachycardia. Flutter waves or atrial tachycardia not seen with adenosine. Will cover with amiodarone iv as he is at risk for PAF. Reasonable to check echo at some point once intra abdominal process fully evaluated. Would involve critical care Troponin elevation likely demand check echo for EF/RWMA;s no indication for acute cath  Charlton Haws MD [facility]

## 2020-05-06 NOTE — ED Provider Notes (Signed)
[facility] [place] EMERGENCY DEPARTMENT Provider Note   CSN: [id number] Arrival date & time: 04/24/2020  0221     History Chief Complaint  Patient presents with  . Abdominal Pain  . SVT    Ethan Johnson is a 74 y.o. male.  Patient presents to the emergency department for evaluation of abdominal pain.  Patient reports that he started having abdominal pain early in the day and has been persistent ever since.  Family reports that he has had some watery stools but no nausea or vomiting.  He has not had any fever.  Pain worsened tonight and therefore EMS was called.  Patient was transported to the ER by EMS who were concerned that he might be in SVT.  At arrival, patient denies chest pain.  He does endorse some shortness of breath and the already noted abdominal pain as well as bilateral leg pain.  He reports that the bilateral leg pain is from his peripheral neuropathy.        Past Medical History:  Diagnosis Date  . BPH (benign prostatic hyperplasia)   . Diabetes mellitus without complication [facility])     Patient Active Problem List   Diagnosis Date Noted  . SBO (small bowel obstruction) (HCC) 07/13/2017    History reviewed. No pertinent surgical history.     History reviewed. No pertinent family history.  Social History   Tobacco Use  . Smoking status: Never Smoker  . Smokeless tobacco: Never Used  Substance Use Topics  . Alcohol use: No  . Drug use: No    Home Medications Prior to Admission medications   Medication Sig Start Date End Date Taking? Authorizing Provider  amitriptyline (ELAVIL) 10 MG tablet Take 10 mg by mouth at bedtime.   Yes [provider]  aspirin 81 MG chewable tablet Chew 81 mg by mouth daily.   Yes [provider]  atorvastatin (LIPITOR) 40 MG tablet Take 40 mg by mouth at bedtime.   Yes [provider]  ferrous sulfate 325 (65 FE) MG tablet Take 325 mg by mouth daily with breakfast.   Yes [provider]  folic acid (FOLVITE) 1 MG tablet Take 1 mg by mouth daily.   Yes [provider]  glipiZIDE (GLUCOTROL) 10 MG tablet Take 10 mg by mouth 2 (two) times daily before a meal.    Yes [provider]  metFORMIN (GLUCOPHAGE) 1000 MG tablet Take 1,000 mg by mouth 2 (two) times daily with a meal.   Yes [provider]  prazosin (MINIPRESS) 1 MG capsule Take 1 mg by mouth at bedtime.   Yes [provider]  tamsulosin (FLOMAX) 0.4 MG CAPS capsule Take 0.4 mg by mouth daily.   Yes [provider]  venlafaxine XR (EFFEXOR-XR) 75 MG 24 hr capsule Take 225 mg by mouth daily with breakfast.   Yes [provider]  methocarbamol (ROBAXIN) 500 MG tablet Take 1 tablet (500 mg total) by mouth at bedtime and may repeat dose one time if needed. Patient not taking: Reported on 05/21/2020 07/13/17   Bethel Born, PA-C    Allergies    Patient has no known allergies.  Review of Systems   Review of Systems  Respiratory: Positive for shortness of breath.   Gastrointestinal: Positive for abdominal pain and diarrhea.  All other systems reviewed and are negative.   Physical Exam Updated Vital Signs BP 112/64 (BP Location: Left Arm)   Pulse (!) 153   Temp 98.7  F (37.1 C) (Oral)   Resp (!) 37   Ht 5\' 10"  (1.778 m)   Wt 100.2 kg   SpO2 97%   BMI 31.70 kg/m   Physical Exam Vitals and nursing note reviewed.  Constitutional:      General: He is not in acute distress.    Appearance: Normal appearance. He is well-developed.  HENT:     Head: Normocephalic and atraumatic.     Right Ear: Hearing normal.     Left Ear: Hearing normal.     Nose: Nose normal.  Eyes:     Conjunctiva/sclera: Conjunctivae normal.     Pupils: Pupils are equal, round, and reactive to light.  Cardiovascular:     Rate and Rhythm: Regular rhythm. Tachycardia present.     Heart sounds: S1 normal and S2 normal. No murmur. No friction rub. No gallop.   Pulmonary:      Effort: Pulmonary effort is normal. Tachypnea present. No respiratory distress.     Breath sounds: Normal breath sounds.  Chest:     Chest wall: No tenderness.  Abdominal:     General: Bowel sounds are normal.     Palpations: Abdomen is soft.     Tenderness: There is abdominal tenderness in the right lower quadrant, suprapubic area and left lower quadrant. There is no guarding or rebound. Negative signs include Murphy's sign and McBurney's sign.     Hernia: No hernia is present.  Musculoskeletal:        General: Normal range of motion.     Cervical back: Normal range of motion and neck supple.  Skin:    General: Skin is warm and dry.     Findings: No rash.  Neurological:     Mental Status: He is alert and oriented to person, place, and time.     GCS: GCS eye subscore is 4. GCS verbal subscore is 5. GCS motor subscore is 6.     Cranial Nerves: No cranial nerve deficit.     Sensory: No sensory deficit.     Coordination: Coordination normal.  Psychiatric:        Speech: Speech normal.        Behavior: Behavior normal.        Thought Content: Thought content normal.     ED Results / Procedures / Treatments   Labs (all labs ordered are listed, but only abnormal results are displayed) Labs Reviewed  CBC WITH DIFFERENTIAL/PLATELET - Abnormal; Notable for the following components:      Result Value   WBC 11.8 (*)    Hemoglobin 12.9 (*)    Neutro Abs 9.8 (*)    All other components within normal limits  COMPREHENSIVE METABOLIC PANEL - Abnormal; Notable for the following components:   CO2 16 (*)    Glucose, Bld 175 (*)    BUN 25 (*)    Creatinine, Ser 2.74 (*)    AST 42 (*)    Total Bilirubin 1.3 (*)    GFR calc non Af Amer 22 (*)    GFR calc Af Amer 25 (*)    Anion gap 22 (*)    All other components within normal limits  LACTIC ACID, PLASMA - Abnormal; Notable for the following components:   Lactic Acid, Venous 6.0 (*)    All other components within normal limits    URINALYSIS, ROUTINE W REFLEX MICROSCOPIC - Abnormal; Notable for the following components:   APPearance CLOUDY (*)    Hgb urine dipstick MODERATE (*)  Ketones, ur 5 (*)    Protein, ur 30 (*)    Leukocytes,Ua LARGE (*)    RBC / HPF >50 (*)    WBC, UA >50 (*)    Bacteria, UA MANY (*)    All other components within normal limits  TROPONIN I (HIGH SENSITIVITY) - Abnormal; Notable for the following components:   Troponin I (High Sensitivity) 417 (*)    All other components within normal limits  TROPONIN I (HIGH SENSITIVITY) - Abnormal; Notable for the following components:   Troponin I (High Sensitivity) 515 (*)    All other components within normal limits  SARS CORONAVIRUS 2 BY RT PCR (HOSPITAL ORDER, [place] [facility])  URINE CULTURE  CULTURE, BLOOD (ROUTINE X 2)  CULTURE, BLOOD (ROUTINE X 2)  LIPASE, BLOOD  LACTIC ACID, PLASMA    EKG None  Radiology CT ABDOMEN PELVIS WO CONTRAST  Result Date: 04/29/2020 CLINICAL DATA:  Unspecified abdominal pain EXAM: CT ABDOMEN AND PELVIS WITHOUT CONTRAST TECHNIQUE: Multidetector CT imaging of the abdomen and pelvis was performed following the standard protocol without IV contrast. COMPARISON:  Abdominal MRI 11/30/2019 FINDINGS: Lower chest:  Coronary atherosclerosis. Hepatobiliary: Hepatic steatosis. There is motion artifact.No evidence of biliary obstruction or stone. Pancreas: Unremarkable. Spleen: Unremarkable. Adrenals/Urinary Tract: 3.4 cm left adrenal mass characterized as adenoma on 2020 abdominal MRI. Distended urinary bladder with associated bilateral hydroureteronephrosis and prominent perinephric stranding. No obstructing stone. Stomach/Bowel:  No obstruction. No visible bowel inflammation. Vascular/Lymphatic: No acute vascular abnormality. Aortic atherosclerosis. No mass or adenopathy. Reproductive:Marked enlargement of the prostate which uplifts the bladder and measures 9 cm craniocaudal. Other: No ascites or  pneumoperitoneum. Musculoskeletal: No acute abnormalities. Severe bilateral hip osteoarthritis. Generalized spondylosis. IMPRESSION: 1. Over distended bladder leading to bilateral hydroureteronephrosis. Related marked prostate enlargement with 10 cm craniocaudal span. 2. Hepatic steatosis. 3.  Aortic Atherosclerosis (ICD10-I70.0). Coronary atherosclerosis. 4. Motion artifact. Electronically Signed   By: Monte Fantasia M.D.   On: 05/05/2020 06:21   DG ABD ACUTE 2+V W 1V CHEST  Result Date: 05/22/2020 CLINICAL DATA:  Abdominal pain, distension EXAM: DG ABDOMEN ACUTE W/ 1V CHEST COMPARISON:  11/30/2019 FINDINGS: Supine and upright frontal views of the abdomen as well as an upright frontal view of the chest are obtained. Cardiac silhouette is unremarkable. No airspace disease, effusion, or pneumothorax. Bowel gas pattern is unremarkable. No obstruction or ileus. No free gas within the greater peritoneal sac. No masses or abnormal calcifications. IMPRESSION: 1. Unremarkable abdominal series. Electronically Signed   By: Randa Ngo M.D.   On: 05/19/2020 03:31    Procedures Procedures (including critical care time)  Medications Ordered in ED Medications  lactated ringers bolus 1,000 mL (0 mLs Intravenous Hold 04/23/2020 0612)  cefTRIAXone (ROCEPHIN) 2 g in sodium chloride 0.9 % 100 mL IVPB (has no administration in time range)  lactated ringers bolus 1,000 mL (has no administration in time range)    And  lactated ringers bolus 500 mL (has no administration in time range)  sodium chloride 0.9 % bolus 1,000 mL (0 mLs Intravenous Stopped 05/07/2020 0349)  adenosine (ADENOCARD) 6 MG/2ML injection 6 mg (6 mg Intravenous Given 05/09/2020 0433)    ED Course  I have reviewed the triage vital signs and the nursing notes.  Pertinent labs & imaging results that were available during my care of the patient were reviewed by me and considered in my medical decision making (see chart for details).    MDM  Rules/Calculators/A&P  Patient presents to the emergency department with multiple problems.  Patient primarily complaining of abdominal pain that has been present for 1 day.  Examination did reveal diffuse tenderness without guarding or rebound at arrival.  Patient was noted to be tachycardic.  Rhythm was unclear at arrival.  It mostly look like sinus tachycardia, but at times there was some irregularity to it suggesting the possibility of atrial fibrillation.  Ultimately his heart rate settled in at around 150 bpm, regular.  This was therefore concerning for atrial flutter with 2-1 block.  Patient was therefore administered adenosine 6 mg IV push and tracing was evaluated.  Heart rate dropped into the 130s with clear P waves followed by QRS complexes indicating sinus tachycardia.  I did have cardiology evaluate the tracings to ensure that there was no other arrhythmia that needed treatment.  Dr. Charna Busman did not feel that this rhythm needed to be treated, was secondary to her underlying problem, as it was likely atrial tachycardia.  She did not feel that the patient needed anticoagulation for the mildly elevated troponins, as this was also likely a leak secondary to the underlying condition and risk outweighed the benefit with the patient having abdominal pain.  Abdominal pain was of unclear etiology at arrival.  With his arrhythmia I was concerned about the possibility of ischemic bowel.  Lab work took some time to come back, but ultimately creatinine was grossly elevated precluding IV contrast.  Patient therefore underwent CT abdomen and pelvis without contrast and was found to have bilateral hydroureteronephrosis secondary to an enlarged prostate.  Outlet obstruction was relieved with a Foley catheter and patient had more than a liter output of urine.  Pain resolved with placement of the catheter.  Urinalysis does appear to be infected.  He does have a markedly elevated lactic acid as  well.  At this point patient was initiated with treatment for possible sepsis as a cause of his vital sign irregularities, although it was not clear upon arrival that he had any infection.  Patient underwent blood cultures, urine culture, completion of his 30 mL/kg of IV fluids and was given Rocephin 2 g IV.  He will be admitted by internal medicine service.  CRITICAL CARE Performed by: Gilda Crease   Total critical care time: 30 minutes  Critical care time was exclusive of separately billable procedures and treating other patients.  Critical care was necessary to treat or prevent imminent or life-threatening deterioration.  Critical care was time spent personally by me on the following activities: development of treatment plan with patient and/or surrogate as well as nursing, discussions with consultants, evaluation of patient's response to treatment, examination of patient, obtaining history from patient or surrogate, ordering and performing treatments and interventions, ordering and review of laboratory studies, ordering and review of radiographic studies, pulse oximetry and re-evaluation of patient's condition.  Final Clinical Impression(s) / ED Diagnoses Final diagnoses:  AKI (acute kidney injury) (HCC)  Bladder outlet obstruction  Urinary tract infection with hematuria, site unspecified    Rx / DC Orders ED Discharge Orders    None       Gilda Crease, MD 05/08/2020 [phone]

## 2020-05-06 NOTE — ED Notes (Signed)
IM paged to [phone]-per Dr. Rush Landmark.

## 2020-05-06 NOTE — ED Notes (Signed)
Lunch Tray Ordered @ 1051. 

## 2020-05-06 NOTE — Progress Notes (Signed)
Telemetry trends reviewed, more clearly defined as sinus tach with occasional PACs. Reviewed with Dr. Eden Emms who requests to continue amiodarone for now as ordered.

## 2020-05-06 NOTE — Progress Notes (Addendum)
Paged by EDP and asked to re-assess patient at request of cardiology. RN just started the patient on LR bolus and patient has not received pain meds yet. Patient states that his abdominal pain is improved and overall he feels better. He appears more comfortable to me but is still tachypneic.   I spoke with cardiology. Concern for deterioration and asked for critical care evaluation. This is reasonable given his tachypnea and risk for tiring out. I have called critical care to ask that they assess the patient.   No change to plan from this AM. Will follow-up lactic acid and culture data. Cardiology to start amiodarone.   Levora Dredge, MD

## 2020-05-06 NOTE — Progress Notes (Signed)
Presented to the bedside to reevaluate the patient. He is currently sitting up. His tachypnea is better. He continues to have abdominal pain. His urine output is very slow since initial fully placement in his urine is very dark. We're continuing to aggressively hydrated him. Have asked that the Foley be flushed and obtained in bladder ultrasound to ensure correct Foley placement. Continue antibiotics and pain management.  Levora Dredge, MD

## 2020-05-06 NOTE — ED Notes (Signed)
Incontinent of stool, cleansed and changed briefs prior to DC.

## 2020-05-06 NOTE — Progress Notes (Signed)
Patient admitted to 5W30 from ED. Pt. A&Ox4 and able to voice needs. Telemetry placed on patient, rate currently in 130's. Red MEWS guidelines implemented and oncoming RN aware. Pt voiced severe abdominal pain. PRN dilaudid given and effective. Pt. instructed on use of call bell and instructed to call before getting out of bed. Will continue to monitor and treat per MD orders.

## 2020-05-06 NOTE — ED Notes (Signed)
Pt currently having pain 10/10, states it has only been relieved slightly.  Able to sit up and drink water with assist to reposition.  Will consult with admitting MD for pain meds.

## 2020-05-06 NOTE — ED Notes (Addendum)
Pt restful.  Internal medicine, cardiology and intensive care MD reviewing case and orders.

## 2020-05-06 NOTE — ED Triage Notes (Signed)
Pt from home for chief complaint of abd pain. Pt also in stable SVT upon arrival, no hx of same. Pt has hx DM (per EMS, CBG 161), HTN & neuropathy. Per EMS, able to convert using vagal maneuvers but went back into SVT

## 2020-05-06 NOTE — ED Notes (Signed)
Pt drowsy, skin very moist and diaphoretic at this time.  No other acute changes.  Remains tachypneic without changes.

## 2020-05-06 NOTE — Progress Notes (Signed)
Spoke with the patient's son, Khaleed Holan. We discussed the patient's presentation and clinical course so far. All questions and concerns addressed.   Please call him with any updates Jamarius Saha, [phone]).   Levora Dredge, MD

## 2020-05-06 NOTE — ED Notes (Signed)
Pt remains restful but reports his pain still an 8 prior to IV meds.  IV's x 2 patent and bolus infusing.

## 2020-05-06 NOTE — Progress Notes (Signed)
   04/29/2020 1851  Assess: MEWS Score  Temp 97.8 F (36.6 C)  BP (!) 107/59  Pulse Rate (!) 130  ECG Heart Rate (!) 129  Resp (!) 32  SpO2 98 %  O2 Device Room Air  Assess: MEWS Score  MEWS Temp 0  MEWS Systolic 0  MEWS Pulse 2  MEWS RR 2  MEWS LOC 0  MEWS Score 4  MEWS Score Color Red  Assess: if the MEWS score is Yellow or Red  Were vital signs taken at a resting state? Yes  Focused Assessment Documented focused assessment  Early Detection of Sepsis Score *See Row Information* Medium  MEWS guidelines implemented *See Row Information* Yes  Treat  MEWS Interventions Administered prn meds/treatments  Take Vital Signs  Increase Vital Sign Frequency  Red: Q 1hr X 4 then Q 4hr X 4, if remains red, continue Q 4hrs  Escalate  MEWS: Escalate Red: discuss with charge nurse/RN and provider, consider discussing with RRT  Notify: Charge Nurse/RN  Name of Charge Nurse/RN Notified Kathlene November  Date Charge Nurse/RN Notified 05/05/2020  Time Charge Nurse/RN Notified 1938  Notify: Provider  Provider Name/Title Dr. Sande Brothers and another resident.  Date Provider Notified 05/07/2020  Time Provider Notified 1940  Notification Type Page  Notification Reason Other (Comment) (New red MEWS.)  Response Other (Comment) (Awaiting response)

## 2020-05-06 NOTE — ED Notes (Signed)
Ethan Johnson, son would like an update [phone]

## 2020-05-06 NOTE — H&P (Addendum)
Date: 05/22/2020               Patient Name:  Ethan Johnson MRN: [id number]  DOB: [birth year] Age / Sex: 74 y.o., male   PCP: [place] Service: Internal Medicine Teaching Service         Attending Physician: Dr. Velna Ochs, MD    First Contact: Dr. Gilford Rile Pager: [phone]  Second Contact: Dr. Koleen Distance Pager: [phone]       After Hours (After 5p/  First Contact Pager: [phone]  weekends / holidays): Second Contact Pager: [phone]   Chief Complaint: Abdominal pain  History of Present Illness: Davi Kroon is a 74 y.o male with DM, HTN, and an enlarged prostate undergoing further evaluation at the [place] who presented with 1.5 days of progressive lower abdominal pain. History was obtained via the patient and through chart review.  The patient states that approximately seven years ago he was told he had an enlarged prostate. He was started on Flomax at that time. Since starting Flomax he has had no urinary hesitancy or weak stream. He felt that he was able to empty his bladder completely. A couple months ago he noticed some intermittent hesitancy and went to the [place] for further evaluation. He was told that his prostate was enlarged and his PSA was elevated. He was scheduled for biopsies next week. Things were going well until approximately 1.5 days ago when he noticed severe urinary hesitancy. He was unable to empty his bladder. He describes his urine dribbling out and having to be constantly in the bathroom. He felt that he was not able to avoid at all. This abdominal pain progressed to the point that he called EMS this morning. Prior to this he was feeling well and denies fevers/chills, headaches, visual changes, altered mentation, sore throat, cough, chest pain, palpitations, nausea/vomiting, diarrhea, constipation, hematuria, dysuria, myalgias, arthralgias, new rash.  Meds:  Current Meds  Medication Sig  . amitriptyline (ELAVIL) 10 MG tablet Take 10 mg by  mouth at bedtime.  Marland Kitchen aspirin 81 MG chewable tablet Chew 81 mg by mouth daily.  Marland Kitchen atorvastatin (LIPITOR) 40 MG tablet Take 40 mg by mouth at bedtime.  . ferrous sulfate 325 (65 FE) MG tablet Take 325 mg by mouth daily with breakfast.  . folic acid (FOLVITE) 1 MG tablet Take 1 mg by mouth daily.  Marland Kitchen glipiZIDE (GLUCOTROL) 10 MG tablet Take 10 mg by mouth 2 (two) times daily before a meal.   . metFORMIN (GLUCOPHAGE) 1000 MG tablet Take 1,000 mg by mouth 2 (two) times daily with a meal.  . prazosin (MINIPRESS) 1 MG capsule Take 1 mg by mouth at bedtime.  . tamsulosin (FLOMAX) 0.4 MG CAPS capsule Take 0.4 mg by mouth daily.  Marland Kitchen venlafaxine XR (EFFEXOR-XR) 75 MG 24 hr capsule Take 225 mg by mouth daily with breakfast.   Allergies: Allergies as of 05/14/2020  . (No Known Allergies)   Past Medical History:  Diagnosis Date  . 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 Aortic atherosclerosis ([place])   . BPH (benign prostatic hyperplasia)   . Diabetes mellitus without complication ([place])   . Hepatic steatosis   . Neuropathy    Family History  Problem Relation Age of Onset  . CAD Neg Hx     Social History: Patient served in the [employer] for 2 1/2 to 3 years. 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 He is  married with one son and two grandchildren. He received most of his care through the [place]. He denies the use of alcohol, tobacco products, or illicit substances.  Review of Systems: A complete ROS was negative except as per HPI.   Physical Exam: Blood pressure 122/74, pulse (!) 144, temperature 98.7 F (37.1 C), temperature source Oral, resp. rate (!) 32, height 5\' 10"  (1.778 m), weight 100.2 kg, SpO2 98 %.  General: Obese male in no acute distress HENT: Normocephalic, atraumatic, moist mucus membranes Pulm: Good air movement with no wheezing or crackles  CV: Tachycardic but regular rhythm, no murmurs, no rubs  Abdomen: Active bowel sounds, soft, distended, suprapubic tenderness to palpation    Extremities: Pulses palpable in all extremities, no LE edema  Skin: Warm and dry  Neuro: Alert and oriented x 3  Significant Labs: High anion gap metabolic acidosis with appropriate compensation  Elevated creatinine  Elevated AST and total bilirubin  Elevated troponin (417 > 515) UA with hematuria, pyuria, and bacteruria   EKG: personally reviewed my interpretation is artrial tachycardia with regular rhythm.  CT Abdomen/Pelvis without Contrast  1. Over distended bladder leading to bilateral hydroureteronephrosis. Related marked prostate enlargement with 10 cm craniocaudal span. 2. Hepatic steatosis. 3.  Aortic Atherosclerosis (ICD10-I70.0). Coronary atherosclerosis. 4. Motion artifact.  Assessment & Plan by Problem: Active Problems:   Obstructive uropathy  Jessi Jessop is a 74 y.o male with DM, HTN, and an enlarged prostate undergoing further evaluation at the [facility] who presented with 1.5 days of progressive lower abdominal pain. He was found to be in SVT. Workup was significant for lactic acidosis in the setting of an elevated creatinine, troponinemia, and CT abdomen illustrating an over distended bladder with bilateral hydronephrosis and a markedly enlarged prostate. He was subsequently admitted for further evaluation/management.  #1. Obstructive uropathy due to an enlarged prostate  #2. AKI secondary to #1 #3. SVT due to sympathetic activation from #1 and complicated UTI #4. HAGMA due to AKI (decreased clearance), metformin use, and sympathetic activation (from #1) - Foley cath in place. Already 1.5L out. Will likely start to have post obstructive diureses. Start aggressive IVF  - Will need to maintain foley on discharge (already has an appointment to evaluate prostate) - Check PSA - SVT should improve with treatment of obstruction - IV Dilaudid 1 mg every 2 hours PRN  - Continue ceftriaxone for 5 days. Follow urine cultures. - Continue tamsulosin  - Trend creatinine and  AG  Troponinemia due to demand ischemia in the setting of known CAD and #2 - EKG no ischemic  - Troponin trend: 417 > 515 - Continue to trend troponin  - No indication for heparin   DM  - Hold metformin in setting of AKI  - Hold glipizide  - SSI while hospitalized   Insomnia  - Continue prazosin and amitriptyline  - Continue Venlafaxine  Diet: Carb mod VTE ppx: Heparin CODE STATUS: Partial (DNI)  Dispo: Admit patient to Observation with expected length of stay less than 2 midnights.  [place], MD 05/01/2020, 8:44 AM  Pager: [phone]

## 2020-05-06 NOTE — ED Notes (Signed)
Pt has projective vomiting across his bed and onto floor, copious amounts of appearing food/fluid substances.  States last ate last PM.  HOB has been elevated above 45 degrees.  Pt more alert and able to answer questions.  Bed changed with 3 person (A), pt unable to assist with repositioning.  Zofran given with relief of sx, and pt returns to rest.

## 2020-05-06 NOTE — Progress Notes (Addendum)
NAME:  Ethan Johnson, MRN:  [id number], DOB:  [birth year], LOS: 0 ADMISSION DATE:  May 12, 2020, CONSULTATION DATE: 05/14/2020 REFERRING MD: Internal medicine teaching service, CHIEF COMPLAINT: Sepsis  Brief History   74 year old with bilateral hydronephrosis from prostate enlargement  History of present illness   74 year old male who receives majority of his care at the [place] has been evaluated with prostate enlargement was scheduled for biopsy in the near future.  He had increasing abdominal pain and discomfort.  Unable to void noted to be dripping urine but unable to void properly.  He presented to [facility] emergency department CT scan showed bilateral hydronephrosis and bladder tension Foley catheter in place with bloody sediment urine obtained.  Urinalysis showed many bacteria.  Also noted to have white count of 11.8 and a lactic acid of greater than 6.  SVT treated with adenosine and amiodarone and cards consult. Reportedly has improved with the Foley catheter.  Fluid resuscitation, and analgesics with IV Dilaudid.  Pulmonary critical care asked to evaluate.  We have ordered a procalcitonin.  Agree with current interventions and will be delighted to follow along for 24 hours as needed. Repeat lactic acid for clearing with treatment.  Past Medical History  Prostate enlargement Diabetes And adrenal incidentaloma Significant Hospital Events   04/27/2020 atrial fibrillation ventricular response to be treated with amiodarone  Consults:  04/28/2020 cardiology 04/26/2020 pulmonary critical care  Procedures:  05/07/2020 Foley catheter  Significant Diagnostic Tests:  04/24/2020 CT of the abdomen with distended bladder leading to bilateral hydronephrosis.  Marked prostate enlargement with 10 cm craniocaudal span.    Micro Data:  04/30/2020 blood cultures x2>> 05/04/2020 urine culture>>  Antimicrobials:  05/19/2020 ceftriaxone  Interim history/subjective:  Currently in emergency  department receiving IV fluid resuscitation along with pain medication with IV Dilaudid on amiodarone drip  Objective   Blood pressure 107/70, pulse (!) 144, temperature 98.7 F (37.1 C), temperature source Oral, resp. rate (!) 40, height 5\' 10"  (1.778 m), weight 100.2 kg, SpO2 98 %.        Intake/Output Summary (Last 24 hours) at May 12, 2020 1006 Last data filed at 05/01/2020 0703 Gross per 24 hour  Intake 3000 ml  Output 1000 ml  Net 2000 ml   Filed Weights   04/30/2020 0229  Weight: 100.2 kg    Examination: General: Obese male who is not in acute distress but is sedated from IV Dilaudid HENT: Oropharynx with dehydration noted no JVD or lymphadenopathy is noted Lungs: Decreased breath sounds at the bases Cardiovascular: Heart sounds are distant and regular Abdomen: Obese distended faint bowel sounds Extremities: Without edema warm Neuro: Lethargic but answers questions appropriately awake alert when stimulated GU: Foley catheter with sediment and blood-tinged  Resolved Hospital Problem list     Assessment & Plan:  Tachycardia most likely multifactorial in the setting of abdominal pain from inability to urinate secondary to prostate enlargement(PSA 138), urinary tract infection with urinalysis showing many bacteria. Agree with fluid resuscitation Agree with empirical antimicrobial therapy covering urinary tract infection Agree with analgesics for pain Cardiology input noted Suspect tachycardia will improve once pain and dehydration are addressed Anticoagulation  Sepsis secondary to urinary tract infection from prostatic enlargement bladder enlargement hydronephrosis.  Concern for prostate cancer Antibiotics Foley placement Hydration Analgesics Consider urology consult [facility] sees [facility] hospital [facility] May need low-dose peripheral vasopressors secondary to dehydration coupled with   Acute renal failure  Lab Results  Component Value Date   CREATININE 2.74 (H) 05/11/2020   CREATININE 0.82 09/15/2015   CREATININE 1.00 07/30/2014  Most likely secondary to hydronephrosis from urinary blockage Track creatinine No indication for CRRT or nephrology involvement at this point.   Adrenal inincidentaloma  Monitor  Hyperglycemia CBG (last 3)  No results for input(s): GLUCAP in the last 72 hours.  Sliding-scale insulin protocol   Best practice:  Diet: npo Pain/Anxiety/Delirium protocol (if indicated): Yes VAP protocol (if indicated): no DVT prophylaxis: heparin GI prophylaxis: PPI Glucose control: SSI protocol Mobility: bedrest Code Status: full Family Communication: Pt up dated at bedside Disposition: Low threshold to move to ICU for 24 hours until he fully declares himself in the setting of sepsis afebrile ventricular response hydronephrosis acute renal failure  Labs   CBC: Recent Labs  Lab 05/11/20 0239  WBC 11.8*  NEUTROABS 9.8*  HGB 12.9*  HCT 40.7  MCV 83.9  PLT 224    Basic Metabolic Panel: Recent Labs  Lab May 11, 2020 0239  NA 139  K 4.2  CL 101  CO2 16*  GLUCOSE 175*  BUN 25*  CREATININE 2.74*  CALCIUM 9.2   GFR: Estimated Creatinine Clearance: 28.1 mL/min (A) (by C-G formula based on SCr of 2.74 mg/dL (H)). Recent Labs  Lab 11-May-2020 0239 05/11/20 0519 May 11, 2020 0811  WBC 11.8*  --   --   LATICACIDVEN  --  6.0* 6.7*    Liver Function Tests: Recent Labs  Lab 05/11/2020 0239  AST 42*  ALT 24  ALKPHOS 102  BILITOT 1.3*  PROT 7.2  ALBUMIN 3.8   Recent Labs  Lab 05/11/20 0239  LIPASE 24   No results for input(s): AMMONIA in the last 168 hours.  ABG    Component Value Date/Time   TCO2 21 07/30/2014 2133     Coagulation Profile: No results for input(s): INR, PROTIME in the last 168 hours.  Cardiac Enzymes: No results for input(s): CKTOTAL, CKMB, CKMBINDEX, TROPONINI in the last 168 hours.  HbA1C: No results found for: HGBA1C  CBG: No results  for input(s): GLUCAP in the last 168 hours.  Review of Systems:   SUBJECTIVE:mros  Past Medical History  He,  has a past medical history of Adrenal incidentaloma (HCC), Aortic atherosclerosis (HCC), BPH (benign prostatic hyperplasia), Diabetes mellitus without complication (HCC), Hepatic steatosis, and Neuropathy.   Surgical History   History reviewed. No pertinent surgical history.   Social History   reports that he has quit smoking. He has never used smokeless tobacco. He reports that he does not drink alcohol or use drugs.   Family History   His family history is negative for CAD.   Allergies No Known Allergies   Home Medications  Prior to Admission medications   Medication Sig Start Date End Date Taking? Authorizing Provider  amitriptyline (ELAVIL) 10 MG tablet Take 10 mg by mouth at bedtime.   Yes [provider]  aspirin 81 MG chewable tablet Chew 81 mg by mouth daily.   Yes [provider]  atorvastatin (LIPITOR) 40 MG tablet Take 40 mg by mouth at bedtime.   Yes [provider]  ferrous sulfate 325 (65 FE) MG tablet Take 325 mg by mouth daily with breakfast.   Yes [provider]  folic acid (FOLVITE) 1 MG tablet Take 1 mg by mouth daily.   Yes [provider]  glipiZIDE (GLUCOTROL) 10 MG tablet Take 10 mg by mouth 2 (two) times daily before a meal.    Yes  [provider]  metFORMIN (GLUCOPHAGE) 1000 MG tablet Take 1,000 mg by mouth 2 (two) times daily with a meal.   Yes [provider]  prazosin (MINIPRESS) 1 MG capsule Take 1 mg by mouth at bedtime.   Yes [provider]  tamsulosin (FLOMAX) 0.4 MG CAPS capsule Take 0.4 mg by mouth daily.   Yes [provider]  venlafaxine XR (EFFEXOR-XR) 75 MG 24 hr capsule Take 225 mg by mouth daily with breakfast.   Yes [provider]  methocarbamol (ROBAXIN) 500 MG tablet Take 1 tablet (500 mg total) by mouth at bedtime and may repeat dose one  time if needed. Patient not taking: Reported on 05/19/2020 07/13/17   Bethel Born, PA-C     Critical care time: 30 min      Brett Canales Mackenzi Krogh ACNP Acute Care Nurse Practitioner [facility] Pulmonary/Critical Care Please consult Amion 05/07/2020, 10:06 AM

## 2020-05-06 NOTE — ED Provider Notes (Signed)
9:33 AM Patient is currently awaiting admission to the internal medicine teaching service.  The cardiology team came by to touch base and express concerns that they were concerned of the patient's appearance and vital signs.  They requested we evaluate the patient and determine if he needs critical care involvement.  I went to reassess the patient personally and saw he was tachycardic in the 140s, tachypneic in the 40s, and blood pressure was in the 90s.  This was my first time evaluating the patient and I was unable to get a clear sense of if this is an improvement or worsening of his appearance.  I spoke with the internal medicine teaching service and implored them to come down to evaluate the patient.    They came down and saw the patient and feel he is actually improving somewhat as compared to when they saw him earlier this morning.  They say that they will continue to monitor and will get critical care involved if they feel he starts to worsen.  We will continue to monitor patient until he is admitted.     Tegeler, Canary Brim, MD 04/29/2020 [phone]

## 2020-05-07 ENCOUNTER — Inpatient Hospital Stay (HOSPITAL_COMMUNITY): Payer: No Typology Code available for payment source

## 2020-05-07 DIAGNOSIS — R008 Other abnormalities of heart beat: Secondary | ICD-10-CM

## 2020-05-07 LAB — BASIC METABOLIC PANEL
Anion gap: 13 (ref 5–15)
BUN: 45 mg/dL — ABNORMAL HIGH (ref 8–23)
CO2: 20 mmol/L — ABNORMAL LOW (ref 22–32)
Calcium: 8.1 mg/dL — ABNORMAL LOW (ref 8.9–10.3)
Chloride: 104 mmol/L (ref 98–111)
Creatinine, Ser: 2.42 mg/dL — ABNORMAL HIGH (ref 0.61–1.24)
GFR calc Af Amer: 29 mL/min — ABNORMAL LOW (ref >60)
GFR calc non Af Amer: 25 mL/min — ABNORMAL LOW (ref >60)
Glucose, Bld: 178 mg/dL — ABNORMAL HIGH (ref 70–99)
Potassium: 4.2 mmol/L (ref 3.5–5.1)
Sodium: 137 mmol/L (ref 135–145)

## 2020-05-07 LAB — PROCALCITONIN: Procalcitonin: 145.79 ng/mL

## 2020-05-07 LAB — GLUCOSE, CAPILLARY
Glucose-Capillary: 168 mg/dL — ABNORMAL HIGH (ref 70–99)
Glucose-Capillary: 177 mg/dL — ABNORMAL HIGH (ref 70–99)
Glucose-Capillary: 171 mg/dL — ABNORMAL HIGH (ref 70–99)
Glucose-Capillary: 155 mg/dL — ABNORMAL HIGH (ref 70–99)

## 2020-05-07 LAB — BLOOD CULTURE ID PANEL (REFLEXED)

## 2020-05-07 LAB — CBC
HCT: 31.2 % — ABNORMAL LOW (ref 39.0–52.0)
Hemoglobin: 10.3 g/dL — ABNORMAL LOW (ref 13.0–17.0)
MCH: 27 pg (ref 26.0–34.0)
MCHC: 33 g/dL (ref 30.0–36.0)
MCV: 81.9 fL (ref 80.0–100.0)
Platelets: 145 10*3/uL — ABNORMAL LOW (ref 150–400)
RBC: 3.81 MIL/uL — ABNORMAL LOW (ref 4.22–5.81)
RDW: 14.4 % (ref 11.5–15.5)
WBC: 25.4 10*3/uL — ABNORMAL HIGH (ref 4.0–10.5)
nRBC: 0 % (ref 0.0–0.2)

## 2020-05-07 LAB — LIPID PANEL
Cholesterol: 65 mg/dL (ref 0–200)
HDL: <10 mg/dL — ABNORMAL LOW (ref >40)
Triglycerides: 196 mg/dL — ABNORMAL HIGH (ref <150)
VLDL: 39 mg/dL (ref 0–40)

## 2020-05-07 LAB — LACTIC ACID, PLASMA
Lactic Acid, Venous: 2.4 mmol/L (ref 0.5–1.9)
Lactic Acid, Venous: 2 mmol/L (ref 0.5–1.9)
Lactic Acid, Venous: 2.1 mmol/L (ref 0.5–1.9)

## 2020-05-07 LAB — CREATININE, URINE, RANDOM: Creatinine, Urine: 127.86 mg/dL

## 2020-05-07 LAB — SODIUM, URINE, RANDOM: Sodium, Ur: 117 mmol/L

## 2020-05-07 NOTE — Progress Notes (Signed)
Subjective:  No cardiac issues less abdominal pain able to rest   Objective:  Vitals:   05/01/2020 2300 05/02/2020 2356 05/07/20 0412 05/07/20 0800  BP: 130/78 130/77 (!) 134/96 131/75  Pulse:  (!) 129 (!) 129 (!) 133  Resp: (!) 21 (!) 25 (!) 25 (!) 38  Temp:  (!) 97.5 F (36.4 C) 98.8 F (37.1 C) 97.6 F (36.4 C)  TempSrc:  Oral Oral Oral  SpO2:  95% 94% 92%  Weight:      Height:        Intake/Output from previous day:  Intake/Output Summary (Last 24 hours) at 05/07/2020 0919 Last data filed at 05/07/2020 0600 Gross per 24 hour  Intake 3920 ml  Output 1601 ml  Net 2319 ml    Physical Exam: Affect appropriate Obese black male  HEENT: normal Neck supple with no adenopathy JVP normal no bruits no thyromegaly Lungs clear with no wheezing and good diaphragmatic motion Heart:  S1/S2 no murmur, no rub, gallop or click PMI normal Abdomen: BS positive mild pain to palpation  no bruit.  No HSM or HJR Distal pulses intact with no bruits No edema Neuro non-focal Skin warm and dry No muscular weakness   Lab Results: Basic Metabolic Panel: Recent Labs    05/14/2020 1202 05/07/20 0642  NA 137 137  K 4.2 4.2  CL 103 104  CO2 16* 20*  GLUCOSE 134* 178*  BUN 29* 45*  CREATININE 3.47* 2.42*  CALCIUM 8.1* 8.1*  PHOS 5.1*  --    Liver Function Tests: Recent Labs    05/03/2020 0239 05/08/2020 1202  AST 42*  --   ALT 24  --   ALKPHOS 102  --   BILITOT 1.3*  --   PROT 7.2  --   ALBUMIN 3.8 2.9*   Recent Labs    04/25/2020 0239  LIPASE 24   CBC: Recent Labs    05/07/2020 0239 05/07/20 0642  WBC 11.8* 24.0*  NEUTROABS 9.8*  --   HGB 12.9* 10.1*  HCT 40.7 30.5*  MCV 83.9 82.4  PLT 224 148*   Cardiac Enzymes: No results for input(s): CKTOTAL, CKMB, CKMBINDEX, TROPONINI in the last 72 hours. BNP: Invalid input(s): POCBNP D-Dimer: No results for input(s): DDIMER in the last 72 hours. Hemoglobin A1C: Recent Labs    05/14/2020 0944  HGBA1C 7.6*   Fasting  Lipid Panel: Recent Labs    05/07/20 0642  CHOL 65  HDL <10*  LDLCALC NOT CALCULATED  TRIG 196*  CHOLHDL NOT CALCULATED   Thyroid Function Tests: Recent Labs    05/09/2020 0944  TSH 2.236   Anemia Panel: No results for input(s): VITAMINB12, FOLATE, FERRITIN, TIBC, IRON, RETICCTPCT in the last 72 hours.  Imaging: CT ABDOMEN PELVIS WO CONTRAST  Result Date: 05/15/2020 CLINICAL DATA:  Unspecified abdominal pain EXAM: CT ABDOMEN AND PELVIS WITHOUT CONTRAST TECHNIQUE: Multidetector CT imaging of the abdomen and pelvis was performed following the standard protocol without IV contrast. COMPARISON:  Abdominal MRI 11/30/2019 FINDINGS: Lower chest:  Coronary atherosclerosis. Hepatobiliary: Hepatic steatosis. There is motion artifact.No evidence of biliary obstruction or stone. Pancreas: Unremarkable. Spleen: Unremarkable. Adrenals/Urinary Tract: 3.4 cm left adrenal mass characterized as adenoma on 2020 abdominal MRI. Distended urinary bladder with associated bilateral hydroureteronephrosis and prominent perinephric stranding. No obstructing stone. Stomach/Bowel:  No obstruction. No visible bowel inflammation. Vascular/Lymphatic: No acute vascular abnormality. Aortic atherosclerosis. No mass or adenopathy. Reproductive:Marked enlargement of the prostate which uplifts the bladder and measures 9 cm craniocaudal.  Other: No ascites or pneumoperitoneum. Musculoskeletal: No acute abnormalities. Severe bilateral hip osteoarthritis. Generalized spondylosis. IMPRESSION: 1. Over distended bladder leading to bilateral hydroureteronephrosis. Related marked prostate enlargement with 10 cm craniocaudal span. 2. Hepatic steatosis. 3.  Aortic Atherosclerosis (ICD10-I70.0). Coronary atherosclerosis. 4. Motion artifact. Electronically Signed   By: Marnee Spring M.D.   On: 04/23/2020 06:21   DG ABD ACUTE 2+V W 1V CHEST  Result Date: 05/13/2020 CLINICAL DATA:  Abdominal pain, distension EXAM: DG ABDOMEN ACUTE W/ 1V CHEST  COMPARISON:  11/30/2019 FINDINGS: Supine and upright frontal views of the abdomen as well as an upright frontal view of the chest are obtained. Cardiac silhouette is unremarkable. No airspace disease, effusion, or pneumothorax. Bowel gas pattern is unremarkable. No obstruction or ileus. No free gas within the greater peritoneal sac. No masses or abnormal calcifications. IMPRESSION: 1. Unremarkable abdominal series. Electronically Signed   By: Sharlet Salina M.D.   On: 04/23/2020 03:31    Cardiac Studies:  ECG: ST   Telemetry: ST rates 120-130    Medications:   . amitriptyline  10 mg Oral QHS  . aspirin  81 mg Oral Daily  . atorvastatin  40 mg Oral QHS  . Chlorhexidine Gluconate Cloth  6 each Topical Daily  . ferrous sulfate  325 mg Oral Q breakfast  . folic acid  1 mg Oral Daily  . heparin  5,000 Units Subcutaneous Q8H  . insulin aspart  0-5 Units Subcutaneous QHS  . insulin aspart  0-9 Units Subcutaneous TID WC  . prazosin  1 mg Oral QHS  . tamsulosin  0.4 mg Oral Daily  . venlafaxine XR  225 mg Oral Q breakfast     . cefTRIAXone (ROCEPHIN)  IV    . lactated ringers Stopped (05/05/2020 0612)  . lactated ringers 200 mL/hr at 05/07/20 0349    Assessment/Plan:   1. Tachycardia:  In setting of sepsis , anemia , elevated lactic acid , pain and obstructive uropathy Check echo for EF Clearly sinus tachycardia not flutter or atrial tachycardia Since physiologic would not Rx unless echo abnormal He has Been well hydrated Continue antibiotics and follow lactic acid and labs   Charlton Haws 05/07/2020, 9:19 AM

## 2020-05-07 NOTE — Progress Notes (Signed)
Night team progress note: Dr. Yetta Barre and I evaluated Ethan Johnson at bedside.  He endorses the he is tired but is able to answer our questions.  Continues to have some abdominal pain but reports improvement after placing foley cath. He is resting comfortably but develops left side abdominal and flank pain when turns in bed for lung exam.   P/E: HR 90s-100s BP stable at 110/70s Mildly tachypnic. Saturating well at RA.  Somnolent but easy to arouse.  Answers questions appropriately.  Abdomen is soft, with diffuse tenderness.  Extremities are warm w nl pulses. Foley cath is in place. 350 ml red/dark orange urine is in urine bag.  Tele shows sinus tachycardia ~110-120. Stable. Cards has ben on board. Amiodarone ordered then canceled.  Assessment and plan: -No post obstruction diuresis -Cr up to 3.4, BUN 29. No uremia symptoms (pt is somnolent but mentions that he is tired b/c he did not sleep last night. Not lethargic, no nausea or vomiting). He had~500 ml urine output for last 5 h. Cr trended up. Recommending talking to nephro in AM if Cr continue to trending up. Will monitor him and his urine output closely overnight.  -Talked to his nurse about we need to closely monitor his urine output. Appreciate her follow up -Notify MD if changes in mental status, Nausea, vomiting -Flushing foley. -Monitor VS -Continue cardiac tele -Continue IV F  ADDENDUM: 3:30 AM:  -Urine output remained ~100 ml/h. (600 ml between 6 pm to midnight). Still tachycardic but BP improved and now at 130/77. -Stable and no uremia symptoms per RN when last checked.  -Continue IVF and monitor pt. -BMP in the morning

## 2020-05-07 NOTE — Progress Notes (Signed)
PHARMACY - PHYSICIAN COMMUNICATION CRITICAL VALUE ALERT - BLOOD CULTURE IDENTIFICATION (BCID)  Ethan Johnson is an 74 y.o. male who presented to [facility] on 05/16/2020 with a chief complaint of abdominal pain.  Assessment:  Pt was found to be in stable SVT, also found to have AKI, UTI, and bladder obstruction, started on ABX, now w/ 1/4 blood cx bottles growing Klebsiella pneumoniae; micro notes that a second bottle has become positive, currently awaiting results.  Name of physician (or Provider) ContactedRennis Harding, MD  Current antibiotics: Rocephin 2g IV Q24H.  Changes to prescribed antibiotics recommended:  Patient is on recommended antibiotics - No changes needed. As d/w MD, if pt does not improve could consider change to aztreonam. If it is determined that there is an intra-abdominal source of infection, would need to add metronidazole though currently clinical picture appears to be urinary source.  Results for orders placed or performed during the hospital encounter of 04/25/2020  Blood Culture ID Panel (Reflexed) (Collected: 05/11/2020  8:16 AM)  Result Value Ref Range   Enterococcus species NOT DETECTED NOT DETECTED   Listeria monocytogenes NOT DETECTED NOT DETECTED   Staphylococcus species NOT DETECTED NOT DETECTED   Staphylococcus aureus (BCID) NOT DETECTED NOT DETECTED   Streptococcus species NOT DETECTED NOT DETECTED   Streptococcus agalactiae NOT DETECTED NOT DETECTED   Streptococcus pneumoniae NOT DETECTED NOT DETECTED   Streptococcus pyogenes NOT DETECTED NOT DETECTED   Acinetobacter baumannii NOT DETECTED NOT DETECTED   Enterobacteriaceae species DETECTED (A) NOT DETECTED   Enterobacter cloacae complex NOT DETECTED NOT DETECTED   Escherichia coli NOT DETECTED NOT DETECTED   Klebsiella oxytoca NOT DETECTED NOT DETECTED   Klebsiella pneumoniae DETECTED (A) NOT DETECTED   Proteus species NOT DETECTED NOT DETECTED   Serratia marcescens NOT DETECTED NOT DETECTED   Carbapenem resistance NOT DETECTED NOT DETECTED   Haemophilus influenzae NOT DETECTED NOT DETECTED   Neisseria meningitidis NOT DETECTED NOT DETECTED   Pseudomonas aeruginosa NOT DETECTED NOT DETECTED   Candida albicans NOT DETECTED NOT DETECTED   Candida glabrata NOT DETECTED NOT DETECTED   Candida krusei NOT DETECTED NOT DETECTED   Candida parapsilosis NOT DETECTED NOT DETECTED   Candida tropicalis NOT DETECTED NOT DETECTED    Vernard Gambles, PharmD, BCPS  05/07/2020  5:32 AM

## 2020-05-07 NOTE — Progress Notes (Signed)
Subjective:  Mr. Ethan Johnson was evaluated at bedside this morning. Patient was asleep, but was able to answer questions on verbal stimulation. He states that he still has abdominal pain. It is better than yesterday but still present. He denies headaches, visual changes, nausea, vomiting, chest pain. He states that he is still urinating. We discussed his sepsis status and that the likely source of infection is from his urine. Patient voices understanding.   Objective:  Vital signs in last 24 hours: Vitals:   05/07/20 0412 05/07/20 0800 05/07/20 0900 05/07/20 1200  BP: (!) 134/96 131/75  138/74  Pulse: (!) 129 (!) 133 (!) 130 (!) 129  Resp: (!) 25 (!) 38 (!) 30 (!) 30  Temp: 98.8 F (37.1 C) 97.6 F (36.4 C)  97.9 F (36.6 C)  TempSrc: Oral Oral  Oral  SpO2: 94% 92% 91% 93%  Weight:      Height:       Physical Exam Constitutional:      Appearance: He is well-developed. He is ill-appearing. He is not toxic-appearing.     Comments: Patient appears ill on examination, able to answer questions appropriately.   HENT:     Head: Normocephalic.  Cardiovascular:     Rate and Rhythm: Regular rhythm. Tachycardia present.     Heart sounds: Normal heart sounds. No murmur. No friction rub. No gallop.   Pulmonary:     Breath sounds: No wheezing, rhonchi or rales.     Comments: Patient tachypneic in room RR of 31.  Abdominal:     General: Bowel sounds are normal. There is distension.     Palpations: Abdomen is soft.     Tenderness: There is abdominal tenderness in the right lower quadrant and left lower quadrant. There is no guarding or rebound.     Comments: Distended. Abdominal pain in the R and L lower quadrants. BS+.   Genitourinary:    Comments: Foley intact, draining dark red urine.  Neurological:     Mental Status: He is alert and oriented to person, place, and time.     Assessment/Plan:  Active Problems:   Obstructive uropathy  Sepsis 2/2 to Obstructive Uropathy by Enlarged  Prostate Acute Kidney Injury in setting of Obstructive Uropathy  Sinus Tachycardia 2/2 to Uropathy and complicated UTI/Sepsis High anion gap metabolic acid 2/2 to AKI with Metformin use and Sepsis: Patient presenting to the [facility] on 05/05/2020 who presented to the ED for abdominal pain tachycardia. Found to have a distended bladder. Foley was placed with 1.5L of urine removed. Patient additionally found to have Klebsiella on urine culture and on blood cultures. Started on Ceftriaxone, and continuing sepsis protocol. Lactic acid was elevated at 6 and is now downtrending to 2.4. Patient tachypneic on examination, able to answer questions accordingly. Abdomen distended on physical examination, BS present. Metabolic acidosis resolving, Cr downtrending to 2.42. No electrolyte abnormalities, CBC concerning for 2 point drop in hemoglobin, and thrombocytopenia which may be consistent with DIC 2/2 to gram negative septicemia. Ordered afternoon CBC.  - AKI creatinine 2.24 from 3.47 with resolved metabolic anion gap.  - Continue trending Lactic acid - CBC - Trend BMP - Continue fluid resuscitation LR 200 mL/hr - Continue Ceftriaxone 2g IV QD - Continue tamsulosin   Diabetes Mellitis:  Home medications of metformin and glipizide.  - SSI while hospitalized - Holding metformin in setting of AKI  Insomnia:  - Continue prazosin and amitriptyline - Continue venlafaxine   Prior to Admission Living Arrangement: Home Anticipated  Discharge Location: Home Barriers to Discharge: Continue Medical treatment Dispo: Anticipated discharge in approximately 3-4 day(s).   Maudie Mercury, MD 05/07/2020, 1:13 PM Pager: [phone]

## 2020-05-07 NOTE — Progress Notes (Signed)
*  PRELIMINARY RESULTS* Echocardiogram 2D Echocardiogram has been performed.  Stacey Drain 05/07/2020, 3:49 PM

## 2020-05-08 LAB — BASIC METABOLIC PANEL
Anion gap: 10 (ref 5–15)
BUN: 35 mg/dL — ABNORMAL HIGH (ref 8–23)
CO2: 23 mmol/L (ref 22–32)
Calcium: 8.4 mg/dL — ABNORMAL LOW (ref 8.9–10.3)
Chloride: 105 mmol/L (ref 98–111)
Creatinine, Ser: 1.59 mg/dL — ABNORMAL HIGH (ref 0.61–1.24)
GFR calc Af Amer: 49 mL/min — ABNORMAL LOW (ref >60)
GFR calc non Af Amer: 42 mL/min — ABNORMAL LOW (ref >60)
Glucose, Bld: 180 mg/dL — ABNORMAL HIGH (ref 70–99)
Potassium: 3.6 mmol/L (ref 3.5–5.1)
Sodium: 138 mmol/L (ref 135–145)

## 2020-05-08 LAB — CBC
HCT: 30.5 % — ABNORMAL LOW (ref 39.0–52.0)
HCT: 29.2 % — ABNORMAL LOW (ref 39.0–52.0)
Hemoglobin: 10.1 g/dL — ABNORMAL LOW (ref 13.0–17.0)
Hemoglobin: 9.4 g/dL — ABNORMAL LOW (ref 13.0–17.0)
MCH: 27.3 pg (ref 26.0–34.0)
MCH: 26.7 pg (ref 26.0–34.0)
MCHC: 33.1 g/dL (ref 30.0–36.0)
MCHC: 32.2 g/dL (ref 30.0–36.0)
MCV: 82.4 fL (ref 80.0–100.0)
MCV: 83 fL (ref 80.0–100.0)
Platelets: 148 10*3/uL — ABNORMAL LOW (ref 150–400)
Platelets: 143 10*3/uL — ABNORMAL LOW (ref 150–400)
RBC: 3.7 MIL/uL — ABNORMAL LOW (ref 4.22–5.81)
RBC: 3.52 MIL/uL — ABNORMAL LOW (ref 4.22–5.81)
RDW: 14.5 % (ref 11.5–15.5)
RDW: 14.5 % (ref 11.5–15.5)
WBC: 24 10*3/uL — ABNORMAL HIGH (ref 4.0–10.5)
WBC: 23.7 10*3/uL — ABNORMAL HIGH (ref 4.0–10.5)
nRBC: 0 % (ref 0.0–0.2)
nRBC: 0.1 % (ref 0.0–0.2)

## 2020-05-08 LAB — URINE CULTURE: Culture: >=100000 — AB

## 2020-05-08 LAB — GLUCOSE, CAPILLARY
Glucose-Capillary: 168 mg/dL — ABNORMAL HIGH (ref 70–99)
Glucose-Capillary: 164 mg/dL — ABNORMAL HIGH (ref 70–99)
Glucose-Capillary: 181 mg/dL — ABNORMAL HIGH (ref 70–99)
Glucose-Capillary: 173 mg/dL — ABNORMAL HIGH (ref 70–99)
Glucose-Capillary: 180 mg/dL — ABNORMAL HIGH (ref 70–99)

## 2020-05-08 LAB — MRSA PCR SCREENING: MRSA by PCR: NEGATIVE

## 2020-05-08 LAB — PROCALCITONIN: Procalcitonin: 71.68 ng/mL

## 2020-05-08 LAB — LACTIC ACID, PLASMA: Lactic Acid, Venous: 1.5 mmol/L (ref 0.5–1.9)

## 2020-05-08 NOTE — Progress Notes (Signed)
Subjective:  No cardiac issues    Objective:  Vitals:   05/08/20 0400 05/08/20 0404 05/08/20 0425 05/08/20 0630  BP: 120/75     Pulse: (!) 120 (!) 120 (!) 120 (!) 118  Resp: (!) 26 (!) 29 (!) 23 (!) 22  Temp: 99 F (37.2 C)   98.7 F (37.1 C)  TempSrc: Oral   Oral  SpO2: 92% 92% 90% 93%  Weight:      Height:        Intake/Output from previous day:  Intake/Output Summary (Last 24 hours) at 05/08/2020 0732 Last data filed at 05/08/2020 0630 Gross per 24 hour  Intake 4561.88 ml  Output 2150 ml  Net 2411.88 ml    Physical Exam: Affect appropriate Obese black male  HEENT: normal Neck supple with no adenopathy JVP normal no bruits no thyromegaly Lungs clear with no wheezing and good diaphragmatic motion Heart:  S1/S2 no murmur, no rub, gallop or click PMI normal Abdomen: BS positive mild pain to palpation  no bruit.  No HSM or HJR Distal pulses intact with no bruits No edema Neuro non-focal Skin warm and dry No muscular weakness   Lab Results: Basic Metabolic Panel: Recent Labs    05/16/2020 1202 05/14/2020 1202 05/07/20 0642 05/08/20 0552  NA 137   < > 137 138  K 4.2   < > 4.2 3.6  CL 103   < > 104 105  CO2 16*   < > 20* 23  GLUCOSE 134*   < > 178* 180*  BUN 29*   < > 45* 35*  CREATININE 3.47*   < > 2.42* 1.59*  CALCIUM 8.1*   < > 8.1* 8.4*  PHOS 5.1*  --   --   --    < > = values in this interval not displayed.   Liver Function Tests: Recent Labs    05/22/2020 0239 04/29/2020 1202  AST 42*  --   ALT 24  --   ALKPHOS 102  --   BILITOT 1.3*  --   PROT 7.2  --   ALBUMIN 3.8 2.9*   Recent Labs    05/05/2020 0239  LIPASE 24   CBC: Recent Labs    05/10/2020 0239 05/07/20 0642 05/07/20 1422 05/08/20 0552  WBC 11.8*   < > 25.4* 23.7*  NEUTROABS 9.8*  --   --   --   HGB 12.9*   < > 10.3* 9.4*  HCT 40.7   < > 31.2* 29.2*  MCV 83.9   < > 81.9 83.0  PLT 224   < > 145* 143*   < > = values in this interval not displayed.   Cardiac Enzymes: No  results for input(s): CKTOTAL, CKMB, CKMBINDEX, TROPONINI in the last 72 hours. BNP: Invalid input(s): POCBNP D-Dimer: No results for input(s): DDIMER in the last 72 hours. Hemoglobin A1C: Recent Labs    05/22/2020 0944  HGBA1C 7.6*   Fasting Lipid Panel: Recent Labs    05/07/20 0642  CHOL 65  HDL <10*  LDLCALC NOT CALCULATED  TRIG 196*  CHOLHDL NOT CALCULATED   Thyroid Function Tests: Recent Labs    05/02/2020 0944  TSH 2.236   Anemia Panel: No results for input(s): VITAMINB12, FOLATE, FERRITIN, TIBC, IRON, RETICCTPCT in the last 72 hours.  Imaging: ECHOCARDIOGRAM COMPLETE  Result Date: 05/07/2020    ECHOCARDIOGRAM REPORT   Patient Name:   Ethan Johnson Date of Exam: 05/07/2020 Medical Rec #:  [id number]  Height:       70.0 in Accession #:    [id number]     Weight:       220.9 lb Date of Birth:  1946/12/16      BSA:          2.177 m Patient Age:    73 years       BP:           137/82 mmHg Patient Gender: M              HR:           130 bpm. Exam Location:  Inpatient Procedure: 2D Echo, Cardiac Doppler and Color Doppler Indications:    Tachycardia  History:        Patient has no prior history of Echocardiogram examinations.                 Risk Factors:Diabetes. Obesity.  Sonographer:    Celesta Gentile RCS Referring Phys: [id number] CAROLYN GUILLOUD IMPRESSIONS  1. Left ventricular ejection fraction, by estimation, is 60 to 65%. The left ventricle has normal function. The left ventricle has no regional wall motion abnormalities. Indeterminate diastolic filling due to E-A fusion.  2. Right ventricular systolic function is normal. The right ventricular size is normal.  3. The mitral valve is normal in structure. Trivial mitral valve regurgitation. No evidence of mitral stenosis.  4. The aortic valve is tricuspid. Aortic valve regurgitation is not visualized. Mild to moderate aortic valve sclerosis/calcification is present, without any evidence of aortic stenosis.  5. The inferior vena cava  is normal in size with greater than 50% respiratory variability, suggesting right atrial pressure of 3 mmHg. FINDINGS  Left Ventricle: Left ventricular ejection fraction, by estimation, is 60 to 65%. The left ventricle has normal function. The left ventricle has no regional wall motion abnormalities. The left ventricular internal cavity size was normal in size. There is  no left ventricular hypertrophy. Indeterminate diastolic filling due to E-A fusion. Right Ventricle: The right ventricular size is normal. No increase in right ventricular wall thickness. Right ventricular systolic function is normal. Left Atrium: Left atrial size was normal in size. Right Atrium: Right atrial size was normal in size. Pericardium: There is no evidence of pericardial effusion. Mitral Valve: The mitral valve is normal in structure. There is mild thickening of the mitral valve leaflet(s). There is mild calcification of the mitral valve leaflet(s). Normal mobility of the mitral valve leaflets. Trivial mitral valve regurgitation. No evidence of mitral valve stenosis. Tricuspid Valve: The tricuspid valve is normal in structure. Tricuspid valve regurgitation is not demonstrated. No evidence of tricuspid stenosis. Aortic Valve: The aortic valve is tricuspid. Aortic valve regurgitation is not visualized. Mild to moderate aortic valve sclerosis/calcification is present, without any evidence of aortic stenosis. Pulmonic Valve: The pulmonic valve was normal in structure. Pulmonic valve regurgitation is not visualized. No evidence of pulmonic stenosis. Aorta: The aortic root is normal in size and structure. Venous: The inferior vena cava is normal in size with greater than 50% respiratory variability, suggesting right atrial pressure of 3 mmHg. IAS/Shunts: No atrial level shunt detected by color flow Doppler.  LEFT VENTRICLE PLAX 2D LVIDd:         5.60 cm LVIDs:         3.70 cm LV PW:         1.00 cm LV IVS:        1.10 cm LVOT diam:     1.75 cm  LV SV:         55 LV SV Index:   25 LVOT Area:     2.41 cm  LV Volumes (MOD) LV vol d, MOD A2C: 51.5 ml LV vol d, MOD A4C: 61.4 ml LV vol s, MOD A2C: 20.9 ml LV vol s, MOD A4C: 21.6 ml LV SV MOD A2C:     30.6 ml LV SV MOD A4C:     61.4 ml LV SV MOD BP:      35.7 ml RIGHT VENTRICLE TAPSE (M-mode): 2.0 cm LEFT ATRIUM             Index       RIGHT ATRIUM           Index LA diam:        2.60 cm 1.19 cm/m  RA Area:     10.20 cm LA Vol (A2C):   32.8 ml 15.06 ml/m RA Volume:   21.70 ml  9.97 ml/m LA Vol (A4C):   15.6 ml 7.16 ml/m LA Biplane Vol: 22.6 ml 10.38 ml/m  AORTIC VALVE LVOT Vmax:   122.00 cm/s LVOT Vmean:  92.600 cm/s LVOT VTI:    0.227 m  AORTA Ao Root diam: 3.30 cm MITRAL VALVE MV Area (PHT): 3.12 cm     SHUNTS MV Decel Time: 243 msec     Systemic VTI:  0.23 m MV E velocity: 64.30 cm/s   Systemic Diam: 1.75 cm MV A velocity: 125.00 cm/s MV E/A ratio:  0.51 Jenkins Rouge MD Electronically signed by Jenkins Rouge MD Signature Date/Time: 05/07/2020/4:03:23 PM    Final     Cardiac Studies:  ECG: ST   Telemetry: ST rates 120-130    Medications:   . amitriptyline  10 mg Oral QHS  . aspirin  81 mg Oral Daily  . atorvastatin  40 mg Oral QHS  . Chlorhexidine Gluconate Cloth  6 each Topical Daily  . ferrous sulfate  325 mg Oral Q breakfast  . folic acid  1 mg Oral Daily  . heparin  5,000 Units Subcutaneous Q8H  . insulin aspart  0-5 Units Subcutaneous QHS  . insulin aspart  0-9 Units Subcutaneous TID WC  . prazosin  1 mg Oral QHS  . tamsulosin  0.4 mg Oral Daily  . venlafaxine XR  225 mg Oral Q breakfast     . cefTRIAXone (ROCEPHIN)  IV Stopped (05/07/20 1900)  . lactated ringers Stopped (04/25/2020 0612)  . lactated ringers 200 mL/hr at 05/08/20 0645    Assessment/Plan:   1. Tachycardia:  In setting of sepsis , anemia , elevated lactic acid , pain and obstructive uropathy Echo reviewed EF 60-65% no RWMA normal RV no significant valve disease and no pericardial effusion   Clearly sinus  tachycardia not flutter or atrial tachycardia Since physiologic would not Rx  Been well hydrated Cr improved to 1.59 Continue antibiotics and follow lactic acid and labs   Cardiology will sign off  Jenkins Rouge 05/08/2020, 7:32 AM

## 2020-05-08 NOTE — Progress Notes (Addendum)
Subjective:  ON Events: None  Ethan Johnson was seen at bedside this morning. Feeling much better. Still having shortness of breath when he eats or drinks and some abdominal pain. Having BMs and passing gas. Discussed that labs are looking better on antibiotics and we will continue course.   Objective:  Vital signs in last 24 hours: Vitals:   05/08/20 0400 05/08/20 0404 05/08/20 0425 05/08/20 0630  BP: 120/75     Pulse: (!) 120 (!) 120 (!) 120 (!) 118  Resp: (!) 26 (!) 29 (!) 23 (!) 22  Temp: 99 F (37.2 C)   98.7 F (37.1 C)  TempSrc: Oral   Oral  SpO2: 92% 92% 90% 93%  Weight:      Height:       Physical Exam Constitutional:      Appearance: He is well-developed. He is not toxic-appearing or diaphoretic.     Comments: Sitting comfortably on examination. No diaphoresis, appears improved from yesterday. No acute distress.   HENT:     Head: Normocephalic and atraumatic.  Cardiovascular:     Rate and Rhythm: Regular rhythm. Tachycardia present.     Heart sounds: Normal heart sounds. No murmur. No friction rub. No gallop.   Pulmonary:     Effort: Pulmonary effort is normal.     Breath sounds: Normal breath sounds. No wheezing, rhonchi or rales.  Abdominal:     General: Bowel sounds are normal. There is distension.     Tenderness: There is no abdominal tenderness. There is no guarding.     Comments: Distended, BS+  Musculoskeletal:     Right lower leg: No edema.     Left lower leg: No edema.  Skin:    General: Skin is warm and dry.  Neurological:     Mental Status: He is alert and oriented to person, place, and time.      CBC Latest Ref Rng & Units 05/08/2020 05/07/2020 05/07/2020  WBC 4.0 - 10.5 K/uL 23.7(H) 25.4(H) 24.0(H)  Hemoglobin 13.0 - 17.0 g/dL 6.1(W) 10.3(L) 10.1(L)  Hematocrit 39.0 - 52.0 % 29.2(L) 31.2(L) 30.5(L)  Platelets 150 - 400 K/uL 143(L) 145(L) 148(L)   BMP Latest Ref Rng & Units 05/07/2020 05/07/2020 05/02/2020  Glucose 70 - 99 mg/dL 431(V) 400(Q)  676(P)  BUN 8 - 23 mg/dL 95(K) 93(O) 67(T)  Creatinine 0.61 - 1.24 mg/dL 2.45(Y) 0.99(I) 3.38(S)  Sodium 135 - 145 mmol/L 137 137 139  Potassium 3.5 - 5.1 mmol/L 4.2 4.2 4.2  Chloride 98 - 111 mmol/L 104 103 101  CO2 22 - 32 mmol/L 20(L) 16(L) 16(L)  Calcium 8.9 - 10.3 mg/dL 8.1(L) 8.1(L) 9.2   Assessment/Plan:  Active Problems:   Obstructive uropathy  Sepsis 2/2 to 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 Found to have a distended bladder. Foley was placed with 1.5L of urine removed. Patient additionally found to have Klebsiella on urine culture and on blood cultures. Started on Ceftriaxone, and continuing sepsis protocol. Lactic acid was elevated at 6 and is now downtrending to 2.4. Patient tachypneic on examination, able to answer questions accordingly. Abdomen distended on physical examination, BS present. Cr downtrending to 1.59 today. Appears much more improved than yesterday. Cultures came back sensitive to ceftriaxone. Will  continue current treatment regimen. Still producing urine, appears yellow on examination today. Will likely need to continue to use foley. Abdomen is still distended, likely due to gas. While patient endorses BM and flatus, would like for him to ambulate to keep bowel moving.  - Out of Bed and ambulate ordered.  - AKI creatinine 1.59 from 2.24 with resolved metabolic anion gap.  - Continue trending Lactic acid - CBC - Trend BMP - Continue fluid resuscitation LR 200 mL/hr - Continue Ceftriaxone 2g IV QD - Continue tamsulosin   Diabetes Mellitis:  Home medications of metformin and glipizide.  - SSI while hospitalized - Holding metformin in setting of AKI  Insomnia:  -  Continue prazosin and amitriptyline - Continue venlafaxine   Prior to Admission Living Arrangement: Home Anticipated Discharge Location: Home Barriers to Discharge: Continue Medical treatment Dispo: Anticipated discharge in approximately 3-4 day(s).   Ethan Mercury, MD 05/08/2020, 6:56 AM Pager: [phone]

## 2020-05-09 LAB — CBC
HCT: 28.7 % — ABNORMAL LOW (ref 39.0–52.0)
Hemoglobin: 9.4 g/dL — ABNORMAL LOW (ref 13.0–17.0)
MCH: 27.1 pg (ref 26.0–34.0)
MCHC: 32.8 g/dL (ref 30.0–36.0)
MCV: 82.7 fL (ref 80.0–100.0)
Platelets: 153 10*3/uL (ref 150–400)
RBC: 3.47 MIL/uL — ABNORMAL LOW (ref 4.22–5.81)
RDW: 14.5 % (ref 11.5–15.5)
WBC: 24.5 10*3/uL — ABNORMAL HIGH (ref 4.0–10.5)
nRBC: 0.1 % (ref 0.0–0.2)

## 2020-05-09 LAB — BASIC METABOLIC PANEL
Anion gap: 11 (ref 5–15)
BUN: 25 mg/dL — ABNORMAL HIGH (ref 8–23)
CO2: 23 mmol/L (ref 22–32)
Calcium: 8.2 mg/dL — ABNORMAL LOW (ref 8.9–10.3)
Chloride: 104 mmol/L (ref 98–111)
Creatinine, Ser: 1.25 mg/dL — ABNORMAL HIGH (ref 0.61–1.24)
GFR calc Af Amer: >60 mL/min (ref >60)
GFR calc non Af Amer: 56 mL/min — ABNORMAL LOW (ref >60)
Glucose, Bld: 198 mg/dL — ABNORMAL HIGH (ref 70–99)
Potassium: 3.6 mmol/L (ref 3.5–5.1)
Sodium: 138 mmol/L (ref 135–145)

## 2020-05-09 LAB — CULTURE, BLOOD (ROUTINE X 2): Special Requests: ADEQUATE

## 2020-05-09 LAB — GLUCOSE, CAPILLARY
Glucose-Capillary: 193 mg/dL — ABNORMAL HIGH (ref 70–99)
Glucose-Capillary: 165 mg/dL — ABNORMAL HIGH (ref 70–99)
Glucose-Capillary: 149 mg/dL — ABNORMAL HIGH (ref 70–99)
Glucose-Capillary: 165 mg/dL — ABNORMAL HIGH (ref 70–99)

## 2020-05-09 MED ORDER — LIP MEDEX EX OINT
TOPICAL_OINTMENT | CUTANEOUS | Status: DC | PRN
  Filled 2020-05-09: qty 7

## 2020-05-09 MED ORDER — CEFAZOLIN SODIUM-DEXTROSE 2-4 GM/100ML-% IV SOLN
2.0000 g | Freq: Three times a day (TID) | INTRAVENOUS | Status: DC
  Administered 2020-05-10 – 2020-05-14 (×14): 2 g via INTRAVENOUS
  Filled 2020-05-09 (×17): qty 100

## 2020-05-09 MED ORDER — ORAL CARE MOUTH RINSE
15.0000 mL | Freq: Two times a day (BID) | OROMUCOSAL | Status: DC
  Administered 2020-05-09 – 2020-05-12 (×7): 15 mL via OROMUCOSAL

## 2020-05-09 MED ORDER — SIMETHICONE 80 MG PO CHEW
80.0000 mg | CHEWABLE_TABLET | Freq: Two times a day (BID) | ORAL | Status: DC
  Administered 2020-05-09 – 2020-05-12 (×6): 80 mg via ORAL
  Filled 2020-05-09 (×7): qty 1

## 2020-05-09 NOTE — H&P (Signed)
CM spoke with the pt via phone - pt confirms his PCP is with the [place].  Pt informed CM that he is from home with his wife and utilizes a scooter for mobility.  CM left VM for [facility] informing of admit

## 2020-05-09 NOTE — Progress Notes (Addendum)
Subjective:  ON Events: None  Patient states that he continues to have diffuse abdominal pain and feels fatigued. Otherwise, he denies new pain today. He states that he does feel better than yesterday. We discussed continued walking to help alleviate his bowels. Patient voices understanding.    Objective:  Vital signs in last 24 hours: Vitals:   05/09/20 0000 05/09/20 0010 05/09/20 0050 05/09/20 0400  BP: (!) 145/81     Pulse: (!) 116 (!) 118 (!) 115   Resp: (!) 26 (!) 27 (!) 24   Temp:    99.1 F (37.3 C)  TempSrc:    Oral  SpO2: 93% 93% 94%   Weight:      Height:       Physical Exam Constitutional:      General: He is not in acute distress.    Appearance: He is obese. He is ill-appearing. He is not toxic-appearing or diaphoretic.  HENT:     Head: Normocephalic and atraumatic.  Cardiovascular:     Rate and Rhythm: Regular rhythm. Tachycardia present.     Heart sounds: Normal heart sounds. No murmur. No friction rub. No gallop.   Abdominal:     General: Bowel sounds are normal. There is distension.     Palpations: Abdomen is soft.     Tenderness: There is abdominal tenderness in the right lower quadrant and left lower quadrant. There is no guarding or rebound.     Comments: Abdomen distended, BS +, Pain in the lower quadrants bilaterally.   Genitourinary:    Comments: Foley intact, putting out yellow urine.  Neurological:     Mental Status: He is oriented to person, place, and time.     CBC Latest Ref Rng & Units 05/08/2020 05/07/2020 05/07/2020  WBC 4.0 - 10.5 K/uL 23.7(H) 25.4(H) 24.0(H)  Hemoglobin 13.0 - 17.0 g/dL 3.5(T) 10.3(L) 10.1(L)  Hematocrit 39.0 - 52.0 % 29.2(L) 31.2(L) 30.5(L)  Platelets 150 - 400 K/uL 143(L) 145(L) 148(L)   BMP Latest Ref Rng & Units 05/08/2020 05/07/2020 05/03/2020  Glucose 70 - 99 mg/dL 732(K) 025(K) 270(W)  BUN 8 - 23 mg/dL 23(J) 62(G) 31(D)  Creatinine 0.61 - 1.24 mg/dL 1.76(H) 6.07(P) 7.10(G)  Sodium 135 - 145 mmol/L 138 137 137    Potassium 3.5 - 5.1 mmol/L 3.6 4.2 4.2  Chloride 98 - 111 mmol/L 105 104 103  CO2 22 - 32 mmol/L 23 20(L) 16(L)  Calcium 8.9 - 10.3 mg/dL 2.6(R) 8.1(L) 8.1(L)   Assessment/Plan:  Active Problems:   Obstructive uropathy  Sepsis 2/2 to 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 Continues to endorse abdominal pain. Simethicone today.  - Out of Bed and ambulate.  - AKI creatinine 1.25 from 1.59 .  - Continue trending Lactic acid - CBC - Trend BMP - Continue fluid resuscitation LR 200 mL/hr - Continue Ceftriaxone 2g IV QD - Continue tamsulosin   Diabetes Mellitis:  Home medications of metformin and glipizide.  - SSI while hospitalized - Holding metformin in setting of AKI  Insomnia:  - Continue prazosin and amitriptyline - Continue venlafaxine   Prior to Admission Living Arrangement: Home Anticipated Discharge Location: Home Barriers to Discharge: Continue Medical treatment Dispo: Anticipated discharge in approximately 3-4 day(s).   Dolan Amen, MD  05/09/2020, 6:19 AM Pager: [phone]

## 2020-05-09 NOTE — Progress Notes (Signed)
Pt has been on ceftriaxone for his klebsiella bacteremia. It has come back sens to cefazolin. D/w Dr Chesley Mires and we will optimize to high dose cefazolin. He has gotten his ceftriaxone dose today so will start cefazolin in AM.   Scr 1.25  CrCl ~ 62 ml/min  Dc ceftriaxone Cefazolin 2g IV q8  Ulyses Southward, PharmD, BCIDP, AAHIVP, CPP Infectious Disease Pharmacist 05/09/2020 3:46 PM

## 2020-05-10 LAB — BASIC METABOLIC PANEL
Anion gap: 9 (ref 5–15)
BUN: 20 mg/dL (ref 8–23)
CO2: 26 mmol/L (ref 22–32)
Calcium: 8 mg/dL — ABNORMAL LOW (ref 8.9–10.3)
Chloride: 103 mmol/L (ref 98–111)
Creatinine, Ser: 1.08 mg/dL (ref 0.61–1.24)
GFR calc Af Amer: >60 mL/min (ref >60)
GFR calc non Af Amer: >60 mL/min (ref >60)
Glucose, Bld: 180 mg/dL — ABNORMAL HIGH (ref 70–99)
Potassium: 3.4 mmol/L — ABNORMAL LOW (ref 3.5–5.1)
Sodium: 138 mmol/L (ref 135–145)

## 2020-05-10 LAB — GLUCOSE, CAPILLARY
Glucose-Capillary: 166 mg/dL — ABNORMAL HIGH (ref 70–99)
Glucose-Capillary: 153 mg/dL — ABNORMAL HIGH (ref 70–99)
Glucose-Capillary: 178 mg/dL — ABNORMAL HIGH (ref 70–99)
Glucose-Capillary: 168 mg/dL — ABNORMAL HIGH (ref 70–99)

## 2020-05-10 LAB — CBC
HCT: 30.1 % — ABNORMAL LOW (ref 39.0–52.0)
Hemoglobin: 9.8 g/dL — ABNORMAL LOW (ref 13.0–17.0)
MCH: 27.1 pg (ref 26.0–34.0)
MCHC: 32.6 g/dL (ref 30.0–36.0)
MCV: 83.4 fL (ref 80.0–100.0)
Platelets: 168 K/uL (ref 150–400)
RBC: 3.61 MIL/uL — ABNORMAL LOW (ref 4.22–5.81)
RDW: 14.7 % (ref 11.5–15.5)
WBC: 20.6 K/uL — ABNORMAL HIGH (ref 4.0–10.5)
nRBC: 0.3 % — ABNORMAL HIGH (ref 0.0–0.2)

## 2020-05-10 LAB — CULTURE, BLOOD (ROUTINE X 2)

## 2020-05-10 MED ORDER — POTASSIUM CHLORIDE CRYS ER 20 MEQ PO TBCR
30.0000 meq | EXTENDED_RELEASE_TABLET | Freq: Two times a day (BID) | ORAL | Status: AC
  Administered 2020-05-10 (×2): 30 meq via ORAL
  Filled 2020-05-10 (×2): qty 1

## 2020-05-10 NOTE — Progress Notes (Signed)
Subjective:  ON Events: None   Ethan Johnson "feels rotten" this morning, primarily from abdominal pain. Hasn't moved his bowels today, but did have a BM yesterday. We discussed the importance of working with PT and ambulating to move his bowels. Patient states he understands.    Objective:  Vital signs in last 24 hours: Vitals:   05/09/20 1626 05/09/20 2000 05/10/20 0000 05/10/20 0400  BP: (!) 149/80 (!) 144/74 (!) 148/75 (!) 160/73  Pulse: (!) 101 100 100 (!) 104  Resp: (!) 22 (!) 22 19 20   Temp: 98.3 F (36.8 C) 99 F (37.2 C) 98.9 F (37.2 C) 99.1 F (37.3 C)  TempSrc: Oral Oral Oral Oral  SpO2: 97% 95% 96% 96%  Weight:      Height:       Physical Exam Vitals reviewed.  Constitutional:      Appearance: He is well-developed.  HENT:     Head: Normocephalic and atraumatic.     Mouth/Throat:     Mouth: Mucous membranes are moist.     Pharynx: Oropharynx is clear.  Cardiovascular:     Rate and Rhythm: Regular rhythm. Tachycardia present.     Heart sounds: Normal heart sounds. No murmur. No friction rub. No gallop.   Pulmonary:     Effort: Pulmonary effort is normal.     Breath sounds: Normal breath sounds. No wheezing, rhonchi or rales.  Abdominal:     General: Abdomen is protuberant. Bowel sounds are normal. There is distension.     Palpations: Abdomen is soft.     Tenderness: There is no abdominal tenderness.     Comments: BS Present. Distended.   Skin:    General: Skin is warm and dry.  Neurological:     Mental Status: He is alert and oriented to person, place, and time.     CBC Latest Ref Rng & Units 05/10/2020 05/09/2020 05/08/2020  WBC 4.0 - 10.5 K/uL 20.6(H) 24.5(H) 23.7(H)  Hemoglobin 13.0 - 17.0 g/dL 05/10/2020) 2.7(O) 5.3(G)  Hematocrit 39.0 - 52.0 % 30.1(L) 28.7(L) 29.2(L)  Platelets 150 - 400 K/uL 168 153 143(L)   BMP Latest Ref Rng & Units 05/10/2020 05/09/2020 05/08/2020  Glucose 70 - 99 mg/dL 05/10/2020) 034(V) 425(Z)  BUN 8 - 23 mg/dL 20 563(O) 75(I)    Creatinine 0.61 - 1.24 mg/dL 43(P 2.95) 1.88(C)  Sodium 135 - 145 mmol/L 138 138 138  Potassium 3.5 - 5.1 mmol/L 3.4(L) 3.6 3.6  Chloride 98 - 111 mmol/L 103 104 105  CO2 22 - 32 mmol/L 26 23 23   Calcium 8.9 - 10.3 mg/dL 8.0(L) 8.2(L) 8.4(L)   Assessment/Plan:  Active Problems:   Obstructive uropathy  Sepsis 2/2 to 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 Continues to endorse abdominal pain, but appears comfortable in appearance. Plan on transitioning to orals in the next day or two. Overall labs have immensely improved with clinical improvement.  - Cr: 1.08 - Out of Bed and ambulate.  - CBC - Trend BMP - Continue fluid resuscitation LR 200 mL/hr - Cefazolin 2g IV q8 - Continue tamsulosin - Gas X   Diabetes Mellitis:  Home medications of metformin and glipizide.  - SSI while hospitalized  Insomnia:  - Continue prazosin and amitriptyline - Continue  venlafaxine   Prior to Admission Living Arrangement: Home Anticipated Discharge Location: Home Barriers to Discharge: Continue Medical treatment Dispo: Anticipated discharge in approximately 3-4 day(s).   Maudie Mercury, MD 05/10/2020, 7:20 AM Pager: [phone]

## 2020-05-10 NOTE — Progress Notes (Signed)
PT Cancellation Note  Patient Details Name: Ethan Johnson MRN: [id number] DOB: 23-Aug-1946   Cancelled Treatment:    Reason Eval/Treat Not Completed: Medical issues which prohibited therapy Per RN, pt with increased abdominal distention and pain. When checking on pt, pt reports he does not feel up to working with PT today and requesting to attempt tomorrow. Will follow up as schedule allows.   Cindee Salt, DPT  Acute Rehabilitation Services  Pager: [phone] Office: [phone]    Lehman Prom 05/10/2020, 11:40 AM

## 2020-05-10 NOTE — Progress Notes (Signed)
Per wife, pt does not walk at home and uses electric w/c.

## 2020-05-10 NOTE — Progress Notes (Addendum)
Tylenol 650 mg given po for c/o continued abdominal pain. Instructed patient that taking Dilaudid will cause abdomen to remain painful from intestinal gas as narcotics cause slowing of intestinal motility, whereas increased intestinal motility is needed to expel gas. Discussed that since patient is wheelchair bound and walking to relieve gas is not an option, that frequent side to side movement in bed is next best option and that Tylenol is a better option for pain relief as well. Verbalized understanding. Warm blanket from warmer placed across abdomen and assisted patient with repositioning in bed.

## 2020-05-10 NOTE — Evaluation (Signed)
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 PMH includes DM and HTN.   Clinical Impression  Pt admitted secondary to problem above with deficits below. Pt with poor tolerance for mobility secondary to pain. Required max A to perform bed mobility tasks. Discussed SNF recommendations, and pt agreeable. Pt very aware of his deficits and reports he needs "a lot of rehab" prior to return home. Will continue to follow acutely to maximize functional mobility independence and safety.     Follow Up Recommendations SNF;Supervision/Assistance - 24 hour    Equipment Recommendations  None recommended by PT    Recommendations for Other Services       Precautions / Restrictions Precautions Precautions: Fall Restrictions Weight Bearing Restrictions: No      Mobility  Bed Mobility Overal bed mobility: Needs Assistance Bed Mobility: Supine to Sit;Sit to Supine     Supine to sit: Max assist Sit to supine: Max assist;+2 for physical assistance   General bed mobility comments: Max A for trunk and LE assist to come to sitting. Pt with increased pain in abdomen with sitting. Required max A +2 to return to supine.   Transfers                 General transfer comment: unable   Ambulation/Gait                Stairs            Wheelchair Mobility    Modified Rankin (Stroke Patients Only)       Balance Overall balance assessment: Needs assistance Sitting-balance support: No upper extremity supported;Feet supported Sitting balance-Leahy Scale: Fair                                       Pertinent Vitals/Pain Pain Assessment: Faces Faces Pain Scale: Hurts even more Pain Location: abdomen Pain Descriptors / Indicators:  Grimacing;Guarding Pain Intervention(s): Limited activity within patient's tolerance;Monitored during session;Repositioned    Home Living Family/patient expects to be discharged to:: Skilled nursing facility                      Prior Function Level of Independence: Needs assistance   Gait / Transfers Assistance Needed: Reports wife assisted occasionally with transfers to power chair   ADL's / Homemaking Assistance Needed: Reports wife assisted with ADLs.         Hand Dominance        Extremity/Trunk Assessment   Upper Extremity Assessment Upper Extremity Assessment: Generalized weakness    Lower Extremity Assessment Lower Extremity Assessment: Generalized weakness    Cervical / Trunk Assessment Cervical / Trunk Assessment: Other exceptions Cervical / Trunk Exceptions: increased abdominal distention   Communication   Communication: Expressive difficulties(mumbled speech )  Cognition Arousal/Alertness: Awake/alert Behavior During Therapy: WFL for tasks assessed/performed Overall Cognitive Status: No family/caregiver present to determine baseline cognitive functioning                                 General Comments: Pt with slowed processing and reporting conflicting information at times.       General Comments  Exercises     Assessment/Plan    PT Assessment Patient needs continued PT services  PT Problem List Decreased strength;Decreased activity tolerance;Decreased balance;Decreased mobility;Decreased knowledge of use of DME;Decreased knowledge of precautions;Pain       PT Treatment Interventions DME instruction;Functional mobility training;Therapeutic activities;Therapeutic exercise;Balance training;Patient/family education;Cognitive remediation    PT Goals (Current goals can be found in the Care Plan section)  Acute Rehab PT Goals Patient Stated Goal: to get stronger before going home PT Goal Formulation: With patient Time For  Goal Achievement: 05/24/20 Potential to Achieve Goals: Good    Frequency Min 2X/week   Barriers to discharge        Co-evaluation               AM-PAC PT "6 Clicks" Mobility  Outcome Measure Help needed turning from your back to your side while in a flat bed without using bedrails?: Total Help needed moving from lying on your back to sitting on the side of a flat bed without using bedrails?: Total Help needed moving to and from a bed to a chair (including a wheelchair)?: Total Help needed standing up from a chair using your arms (e.g., wheelchair or bedside chair)?: Total Help needed to walk in hospital room?: Total Help needed climbing 3-5 steps with a railing? : Total 6 Click Score: 6    End of Session   Activity Tolerance: Patient limited by pain Patient left: in bed;with call bell/phone within reach;with bed alarm set Nurse Communication: Mobility status PT Visit Diagnosis: Unsteadiness on feet (R26.81);Muscle weakness (generalized) (M62.81);Other abnormalities of gait and mobility (R26.89);Difficulty in walking, not elsewhere classified (R26.2)    Time: 2993-7169 PT Time Calculation (min) (ACUTE ONLY): 17 min   Charges:   PT Evaluation $PT Eval Moderate Complexity: 1 Mod          Ethan Johnson, PT, DPT  Acute Rehabilitation Services  Pager: [phone] Office: [phone]   Ethan Johnson 05/10/2020, 6:07 PM

## 2020-05-10 NOTE — Progress Notes (Cosign Needed)
Spoke with Wife, Shrell, at 5:15 pm to update on her husband's condition. We discussed that he is overall improving, and she reaffirms that her husband uses a wheelchair at home and scooter to get the mail. We discussed his clinical course and that overall her husband is improving from his admission on Friday.  Dolan Amen, MD IMTS, PGY-1 Pager: [phone] 05/10/2020,5:22 PM

## 2020-05-11 ENCOUNTER — Inpatient Hospital Stay (HOSPITAL_COMMUNITY): Payer: No Typology Code available for payment source

## 2020-05-11 DIAGNOSIS — K311 Adult hypertrophic pyloric stenosis: Secondary | ICD-10-CM

## 2020-05-11 LAB — BASIC METABOLIC PANEL
Anion gap: 17 — ABNORMAL HIGH (ref 5–15)
BUN: 18 mg/dL (ref 8–23)
CO2: 23 mmol/L (ref 22–32)
Calcium: 8 mg/dL — ABNORMAL LOW (ref 8.9–10.3)
Chloride: 99 mmol/L (ref 98–111)
Creatinine, Ser: 1.06 mg/dL (ref 0.61–1.24)
GFR calc Af Amer: >60 mL/min (ref >60)
GFR calc non Af Amer: >60 mL/min (ref >60)
Glucose, Bld: 159 mg/dL — ABNORMAL HIGH (ref 70–99)
Potassium: 3.9 mmol/L (ref 3.5–5.1)
Sodium: 139 mmol/L (ref 135–145)

## 2020-05-11 LAB — CBC
HCT: 32.4 % — ABNORMAL LOW (ref 39.0–52.0)
Hemoglobin: 10.5 g/dL — ABNORMAL LOW (ref 13.0–17.0)
MCH: 27.2 pg (ref 26.0–34.0)
MCHC: 32.4 g/dL (ref 30.0–36.0)
MCV: 83.9 fL (ref 80.0–100.0)
Platelets: 175 10*3/uL (ref 150–400)
RBC: 3.86 MIL/uL — ABNORMAL LOW (ref 4.22–5.81)
RDW: 14.8 % (ref 11.5–15.5)
WBC: 24 10*3/uL — ABNORMAL HIGH (ref 4.0–10.5)
nRBC: 0.3 % — ABNORMAL HIGH (ref 0.0–0.2)

## 2020-05-11 LAB — GLUCOSE, CAPILLARY
Glucose-Capillary: 158 mg/dL — ABNORMAL HIGH (ref 70–99)
Glucose-Capillary: 149 mg/dL — ABNORMAL HIGH (ref 70–99)
Glucose-Capillary: 128 mg/dL — ABNORMAL HIGH (ref 70–99)
Glucose-Capillary: 168 mg/dL — ABNORMAL HIGH (ref 70–99)

## 2020-05-11 LAB — LACTIC ACID, PLASMA
Lactic Acid, Venous: 1.4 mmol/L (ref 0.5–1.9)
Lactic Acid, Venous: 1.4 mmol/L (ref 0.5–1.9)

## 2020-05-11 MED ORDER — LORAZEPAM 2 MG/ML IJ SOLN
0.5000 mg | Freq: Once | INTRAMUSCULAR | Status: AC
  Administered 2020-05-11: 0.5 mg via INTRAVENOUS
  Filled 2020-05-11: qty 1

## 2020-05-11 MED ORDER — IOHEXOL 9 MG/ML PO SOLN
ORAL | Status: AC
  Administered 2020-05-11: 500 mL via ORAL
  Filled 2020-05-11: qty 500

## 2020-05-11 MED ORDER — RAMELTEON 8 MG PO TABS
8.0000 mg | ORAL_TABLET | Freq: Every day | ORAL | Status: DC
  Filled 2020-05-11 (×2): qty 1

## 2020-05-11 MED ORDER — IOHEXOL 300 MG/ML  SOLN
100.0000 mL | Freq: Once | INTRAMUSCULAR | Status: AC | PRN
  Administered 2020-05-11: 100 mL via INTRAVENOUS

## 2020-05-11 MED ORDER — IOHEXOL 9 MG/ML PO SOLN
500.0000 mL | ORAL | Status: AC
  Administered 2020-05-11: 500 mL via ORAL

## 2020-05-11 MED ORDER — PANTOPRAZOLE SODIUM 40 MG IV SOLR
40.0000 mg | INTRAVENOUS | Status: DC
  Administered 2020-05-11: 40 mg via INTRAVENOUS
  Filled 2020-05-11: qty 40

## 2020-05-11 MED ORDER — PANTOPRAZOLE SODIUM 40 MG IV SOLR
40.0000 mg | Freq: Two times a day (BID) | INTRAVENOUS | Status: DC
  Administered 2020-05-11 – 2020-05-22 (×21): 40 mg via INTRAVENOUS
  Filled 2020-05-11 (×22): qty 40

## 2020-05-11 MED ORDER — IOHEXOL 9 MG/ML PO SOLN
ORAL | Status: AC
  Filled 2020-05-11: qty 500

## 2020-05-11 NOTE — Progress Notes (Signed)
ATTEMPTED to insert gastric tube x4.  Pt very tense and could not push past resistance.  Dr made aware and a request for medication to calm pt was made and medication will be available to attempt to insert NG tube

## 2020-05-11 NOTE — Progress Notes (Addendum)
Attempted unsuccessfully to insert NG tube through right nare.  Pt is confused, unable to get the pt to understand the need for NGT.  Pt constantly grabbing at tube, trying to pull it out.  Pt had to be held down and placed in soft mitts while tube was being inserted.  Tube was ultimately removed due to concern for entering airway rather than stomach (pt was coughing and in respiratory distress).

## 2020-05-11 NOTE — Progress Notes (Signed)
   05/11/20 1013  Assess: MEWS Score  BP (!) 165/71  Pulse Rate (!) 108  ECG Heart Rate (!) 112  Resp (!) 28  SpO2 94 %  O2 Device Nasal Cannula  Patient Activity (if Appropriate) In bed  O2 Flow Rate (L/min) 2 L/min  Assess: MEWS Score  MEWS Temp 0  MEWS Systolic 0  MEWS Pulse 2  MEWS RR 2  MEWS LOC 0  MEWS Score 4  MEWS Score Color Red  Assess: if the MEWS score is Yellow or Red  Were vital signs taken at a resting state? Yes  Focused Assessment Documented focused assessment  Early Detection of Sepsis Score *See Row Information* Low  MEWS guidelines implemented *See Row Information* Yes  Treat  MEWS Interventions Escalated (See documentation below)  Take Vital Signs  Increase Vital Sign Frequency  Red: Q 1hr X 4 then Q 4hr X 4, if remains red, continue Q 4hrs  Escalate  MEWS: Escalate Red: discuss with charge nurse/RN and provider, consider discussing with RRT  Notify: Charge Nurse/RN  Name of Charge Nurse/RN Notified Nena Polio, RN  Date Charge Nurse/RN Notified 05/11/20  Time Charge Nurse/RN Notified 1035  Notify: Provider  Provider Name/Title Dr. Sande Brothers  Date Provider Notified 05/11/20  Time Provider Notified 1036  Notification Type Page  Notification Reason Change in status  Response No new orders  Date of Provider Response 05/11/20  Time of Provider Response 1038  Document  Progress note created (see row info) Yes

## 2020-05-11 NOTE — Progress Notes (Addendum)
Subjective:  ON Events: abdominal pain, increased resp rate, oxygen desaturation. Pain that moves around.   Mr. Ethan Johnson was evaluated at bedside this morning. States that he continues to have abdominal pain with decreased appetite. He denies nausea or vomitting. We discussed that we will further evaluate his abdominal pain with imaging today. Patient voiced understanding.   Objective:  Vital signs in last 24 hours: Vitals:   05/11/20 0054 05/11/20 0200 05/11/20 0409 05/11/20 0410  BP:  (!) 144/60 (!) 158/70   Pulse:  (!) 104 (!) 107 (!) 108  Resp:   (!) 30   Temp: 98.9 F (37.2 C) 98.9 F (37.2 C) 98.5 F (36.9 C)   TempSrc: Oral Oral Oral Oral  SpO2:  94% 94%   Weight:   118 kg   Height:       Physical Exam Vitals and nursing note reviewed.  Constitutional:      General: He is not in acute distress.    Appearance: He is obese. He is not ill-appearing, toxic-appearing or diaphoretic.  HENT:     Head: Normocephalic and atraumatic.  Cardiovascular:     Rate and Rhythm: Regular rhythm. Tachycardia present.  Pulmonary:     Effort: Pulmonary effort is normal.     Breath sounds: Normal breath sounds. No wheezing, rhonchi or rales.  Abdominal:     General: Bowel sounds are normal. There is distension.     Tenderness: There is abdominal tenderness in the epigastric area. There is guarding.     Comments: Distended, BS+, Pain elicited on palpation, no guarding or rebound tenderness.   Skin:    General: Skin is warm and dry.  Neurological:     Mental Status: He is oriented to person, place, and time.     CBC Latest Ref Rng & Units 05/11/2020 05/10/2020 05/09/2020  WBC 4.0 - 10.5 K/uL 24.0(H) 20.6(H) 24.5(H)  Hemoglobin 13.0 - 17.0 g/dL 10.5(L) 9.8(L) 9.4(L)  Hematocrit 39.0 - 52.0 % 32.4(L) 30.1(L) 28.7(L)  Platelets 150 - 400 K/uL 175 168 153   BMP Latest Ref Rng & Units 05/11/2020 05/10/2020 05/09/2020  Glucose 70 - 99 mg/dL 563(O) 756(E) 332(R)  BUN 8 - 23 mg/dL 18 20  51(O)  Creatinine 0.61 - 1.24 mg/dL 8.41 6.60 6.30(Z)  Sodium 135 - 145 mmol/L 139 138 138  Potassium 3.5 - 5.1 mmol/L 3.9 3.4(L) 3.6  Chloride 98 - 111 mmol/L 99 103 104  CO2 22 - 32 mmol/L 23 26 23   Calcium 8.9 - 10.3 mg/dL 8.0(L) 8.0(L) 8.2(L)   Assessment/Plan:  Active Problems:   Obstructive uropathy  Gram Negative Bacteremia 2/2 to 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 Continues to endorse abdominal pain, will arrange for CXR, patient endorses flatus and BMs, but continued abdominal pain concerning for possible SBO/Ileus. Overall labs have immensely improved with clinical improvement.  - Cr: 1.06 - WBC: 24<20.6   - CBC - Trend BMP - Continue fluid resuscitation LR 200 mL/hr - Cefazolin 2g IV q8 - Continue tamsulosin - Gas X  - PT recommending SNF placement  Abdominal Pain Likely Gastric Outlet Obstruction:  Patient with persistent complaints of abdominal pain now with decreased appetite. Xray ordered which showed vastly dilated stomach which is concerning for gastric outlet obstruction. Further evaluate for mechanical obstruction.  - Diet NPO -  IV Protonix - CT Abdomen with oral/IV contrast - NG tube placement after comes back from CT.   Diabetes Mellitis:  Home medications of metformin and glipizide.  - SSI while hospitalized  Insomnia:  - Continue prazosin and amitriptyline - Continue venlafaxine   Prior to Admission Living Arrangement: Home Anticipated Discharge Location: Home Barriers to Discharge: Continue Medical treatment Dispo: Anticipated discharge in approximately 3-4 day(s).   Maudie Mercury, MD 05/11/2020, 5:56 AM Pager: [phone]

## 2020-05-11 NOTE — Progress Notes (Signed)
Paged to evaluate patient for abd pain, increased resp rate, and oxygen desaturation. Patient reports he feels uncomfortable and has abdominal pain that moves around. Likely gas pains as patient is belching and recent imaging show no obstruction. Patient had multiple loose dark stools this afternoon ,but is on oral iron supplementation.  Abdomen is distended, but not rigid. No focal abdominal pain on palpation and active bowel sounds. Foley bag noted to be red, but patient currently having clear urine output. Sating 97% on 2L, no wheezing or rhonchi.   - continue current plan

## 2020-05-11 NOTE — Progress Notes (Signed)
Paged on call MD, Dr Barbaraann Faster, and notified of the following: multiple loose, black stools this shift, temp 100.3 (then down to 98.9), unable to tolerate room air without O2 sat decreasing into the 80s, MEWS yellow for increased respirations (when patient is grunting with abd pain at times), blood noted in urine, and abdomen remains distended and painful. Dr Barbaraann Faster in to see patient. New order received for sleep med at patients request. Resting quietly in bed now.

## 2020-05-11 NOTE — TOC Initial Note (Addendum)
Transition of Care [facility]) - Initial/Assessment Note    Patient Details  Name: Ethan Johnson MRN: [id number] Date of Birth: [birth year]  Transition of Care [facility]) CM/SW Contact:    Mearl Latin, LCSW Phone Number: 05/11/2020, 9:04 AM  Clinical Narrative:                 9am-CSW received consult for possible SNF placement at time of discharge. CSW spoke with patient's spouse regarding PT recommendation of SNF placement at time of discharge. Patient's spouse reported that patient's spouse is currently unable to care for patient at their home given patient's current physical needs and fall risk. He normally uses a scooter for mobility at home. Patient expressed understanding of PT recommendation and is agreeable to SNF placement at time of discharge. CSW discussed insurance authorization process and provided Medicare SNF ratings list and the difficulty in finding a bed available at a [place] SNF. Patient's spouse expressed agreement to try to find a local SNF through patient's Medicare. Patient expressed being hopeful for rehab and to feel better soon. CSW left a [facility]ic[facility] social worker in the event that Medicare denied rehab stay. Will present bed offers when available. No further questions reported at this time. CSW to continue to follow and assist with discharge planning needs.  2pm-CSW received return call from [facility] w/[facility]. She states that patient does have short and long term SNF benefits available and if Medicare denies rehab stay, CSW can fax referral to the [facility] (f. [phone]).    Expected Discharge Plan: Skilled Nursing Facility Barriers to Discharge: Continued Medical Work up, English as a second language teacher, SNF Pending bed offer   Patient Goals and CMS Choice Patient states their goals for this hospitalization and ongoing recovery are:: Rehab CMS Medicare.gov Compare Post Acute Care list provided to:: Patient Represenative (must comment)(Spouse) Choice offered to / list presented to :  Spouse, Patient  Expected Discharge Plan and Services Expected Discharge Plan: Skilled Nursing Facility In-house Referral: Clinical Social Work   Post Acute Care Choice: Skilled Nursing Facility Living arrangements for the past 2 months: Single Family Home                                      Prior Living Arrangements/Services Living arrangements for the past 2 months: Single Family Home Lives with:: Spouse Patient language and need for interpreter reviewed:: Yes Do you feel safe going back to the place where you live?: Yes      Need for Family Participation in Patient Care: Yes (Comment) Care giver support system in place?: Yes (comment) Current home services: DME Criminal Activity/Legal Involvement Pertinent to Current Situation/Hospitalization: No - Comment as needed  Activities of Daily Living      Permission Sought/Granted Permission sought to share information with : Facility Medical sales representative, Family Supports Permission granted to share information with : Yes, Verbal Permission Granted  Share Information with NAME: Shrell  Permission granted to share info w AGENCY: SNFs  Permission granted to share info w Relationship: Spouse  Permission granted to share info w Contact Information: [phone]  Emotional Assessment Appearance:: Appears stated age Attitude/Demeanor/Rapport: Engaged Affect (typically observed): Accepting, Appropriate Orientation: : Oriented to Self, Oriented to Place Alcohol / Substance Use: Not Applicable Psych Involvement: No (comment)  Admission diagnosis:  Obstructive uropathy [N13.9] AKI (acute kidney injury) (HCC) [N17.9] Bladder outlet obstruction [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 Obstructive uropathy 05/20/2020  . AKI (acute kidney injury) ([place])   . Bladder outlet obstruction   . Urinary tract infection with hematuria   .  SBO (small bowel obstruction) ([place]) 07/13/2017   PCP:  [place]:   [place], [place] - [address] AT [address] [place] [address] Phone: [phone] Fax: [place], [place]. [place] [address] Phone: [phone] Fax: [phone]     Social Determinants of Health (SDOH) Interventions    Readmission Risk Interventions No flowsheet data found.

## 2020-05-11 NOTE — Consult Note (Signed)
Reason for Consult:  Gastric distention Referring Physician: Antony Contras, MD  Ethan Johnson is an 74 y.o. male.  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  This had caused bilateral hydronephrosis and AKI.  Apparently, he is being worked up for this prostate issue as well as an adrenal incidentaloma at [facility].  Over the last day or so, he has been having some increasing abdominal distention and decreased appetite.  No nausea or vomiting.  Last BM yesterday.    Plain films and CT scan showed a massively dilated stomach with some possible gastric pneumatosis vs. Ingested material.  Contrast does pass into the small bowel.  The IM attending's note from 1:21 PM today mentioned NG tube placement for decompression, but no NG tube has yet been placed.  Past Medical History:  Diagnosis Date  . Adrenal incidentaloma (HCC)   . Aortic atherosclerosis (HCC)   . BPH (benign prostatic hyperplasia)   . Diabetes mellitus without complication (HCC)   . Hepatic steatosis   . Neuropathy     History reviewed. No pertinent surgical history.  Family History  Problem Relation Age of Onset  . CAD Neg Hx     Social History:  reports that he has quit smoking. He has never used smokeless tobacco. He reports that he does not drink alcohol or use drugs.  Allergies: No Known Allergies  Medications:   Prior to Admission medications   Medication Sig Start Date End Date Taking? Authorizing Provider  amitriptyline (ELAVIL) 10 MG tablet Take 10 mg by mouth at bedtime.   Yes [provider]  aspirin 81 MG chewable tablet Chew 81 mg by mouth daily.   Yes [provider]  atorvastatin (LIPITOR) 40 MG tablet Take 40 mg by mouth at bedtime.   Yes [provider]  ferrous sulfate 325 (65 FE) MG tablet Take 325 mg by mouth daily with breakfast.   Yes [provider]  folic  acid (FOLVITE) 1 MG tablet Take 1 mg by mouth daily.   Yes [provider]  glipiZIDE (GLUCOTROL) 10 MG tablet Take 10 mg by mouth 2 (two) times daily before a meal.    Yes [provider]  metFORMIN (GLUCOPHAGE) 1000 MG tablet Take 1,000 mg by mouth 2 (two) times daily with a meal.   Yes [provider]  prazosin (MINIPRESS) 1 MG capsule Take 1 mg by mouth at bedtime.   Yes [provider]  tamsulosin (FLOMAX) 0.4 MG CAPS capsule Take 0.4 mg by mouth daily.   Yes [provider]  venlafaxine XR (EFFEXOR-XR) 75 MG 24 hr capsule Take 225 mg by mouth daily with breakfast.   Yes [provider]  methocarbamol (ROBAXIN) 500 MG tablet Take 1 tablet (500 mg total) by mouth at bedtime and may repeat dose one time if needed. Patient not taking: Reported on 05/05/2020 07/13/17   Bethel Born, PA-C     Results for orders placed or performed during the hospital encounter of 05/10/2020 (from the past 48 hour(s))  Glucose, capillary     Status: Abnormal   Collection Time: 05/09/20  9:35 PM  Result Value Ref Range   Glucose-Capillary 165 (H) 70 - 99 mg/dL    Comment: Glucose reference range applies only to samples taken after fasting for at least 8 hours.  CBC     Status: Abnormal   Collection Time:  05/10/20  4:59 AM  Result Value Ref Range   WBC 20.6 (H) 4.0 - 10.5 K/uL   RBC 3.61 (L) 4.22 - 5.81 MIL/uL   Hemoglobin 9.8 (L) 13.0 - 17.0 g/dL   HCT 30.1 (L) 39.0 - 52.0 %   MCV 83.4 80.0 - 100.0 fL   MCH 27.1 26.0 - 34.0 pg   MCHC 32.6 30.0 - 36.0 g/dL   RDW 14.7 11.5 - 15.5 %   Platelets 168 150 - 400 K/uL   nRBC 0.3 (H) 0.0 - 0.2 %    Comment: Performed at [facility] [address], [place], [place] [address]  Basic metabolic panel     Status: Abnormal   Collection Time: 05/10/20  4:59 AM  Result Value Ref Range   Sodium 138 135 - 145 mmol/L   Potassium 3.4 (L) 3.5 - 5.1 mmol/L   Chloride 103 98 - 111 mmol/L   CO2 26 22 - 32  mmol/L   Glucose, Bld 180 (H) 70 - 99 mg/dL    Comment: Glucose reference range applies only to samples taken after fasting for at least 8 hours.   BUN 20 8 - 23 mg/dL   Creatinine, Ser 1.08 0.61 - 1.24 mg/dL   Calcium 8.0 (L) 8.9 - 10.3 mg/dL   GFR calc non Af Amer >60 >60 mL/min   GFR calc Af Amer >60 >60 mL/min   Anion gap 9 5 - 15    Comment: Performed at [facility] [address] [place], [place] [address]  Glucose, capillary     Status: Abnormal   Collection Time: 05/10/20  7:31 AM  Result Value Ref Range   Glucose-Capillary 166 (H) 70 - 99 mg/dL    Comment: Glucose reference range applies only to samples taken after fasting for at least 8 hours.  Glucose, capillary     Status: Abnormal   Collection Time: 05/10/20 11:54 AM  Result Value Ref Range   Glucose-Capillary 153 (H) 70 - 99 mg/dL    Comment: Glucose reference range applies only to samples taken after fasting for at least 8 hours.  Glucose, capillary     Status: Abnormal   Collection Time: 05/10/20  4:08 PM  Result Value Ref Range   Glucose-Capillary 178 (H) 70 - 99 mg/dL    Comment: Glucose reference range applies only to samples taken after fasting for at least 8 hours.  Glucose, capillary     Status: Abnormal   Collection Time: 05/10/20  9:56 PM  Result Value Ref Range   Glucose-Capillary 168 (H) 70 - 99 mg/dL    Comment: Glucose reference range applies only to samples taken after fasting for at least 8 hours.  CBC     Status: Abnormal   Collection Time: 05/11/20  3:31 AM  Result Value Ref Range   WBC 24.0 (H) 4.0 - 10.5 K/uL   RBC 3.86 (L) 4.22 - 5.81 MIL/uL   Hemoglobin 10.5 (L) 13.0 - 17.0 g/dL   HCT 32.4 (L) 39.0 - 52.0 %   MCV 83.9 80.0 - 100.0 fL   MCH 27.2 26.0 - 34.0 pg   MCHC 32.4 30.0 - 36.0 g/dL   RDW 14.8 11.5 - 15.5 %   Platelets 175 150 - 400 K/uL   nRBC 0.3 (H) 0.0 - 0.2 %    Comment: Performed at [facility] [address] [place], [place] [address]  Basic metabolic panel      Status: Abnormal  Collection Time: 05/11/20  3:31 AM  Result Value Ref Range   Sodium 139 135 - 145 mmol/L   Potassium 3.9 3.5 - 5.1 mmol/L   Chloride 99 98 - 111 mmol/L   CO2 23 22 - 32 mmol/L   Glucose, Bld 159 (H) 70 - 99 mg/dL    Comment: Glucose reference range applies only to samples taken after fasting for at least 8 hours.   BUN 18 8 - 23 mg/dL   Creatinine, Ser 5.42 0.61 - 1.24 mg/dL   Calcium 8.0 (L) 8.9 - 10.3 mg/dL   GFR calc non Af Amer >60 >60 mL/min   GFR calc Af Amer >60 >60 mL/min   Anion gap 17 (H) 5 - 15    Comment: Performed at [facility], [address] [place], [place] [address]  Glucose, capillary     Status: Abnormal   Collection Time: 05/11/20  7:59 AM  Result Value Ref Range   Glucose-Capillary 158 (H) 70 - 99 mg/dL    Comment: Glucose reference range applies only to samples taken after fasting for at least 8 hours.  Glucose, capillary     Status: Abnormal   Collection Time: 05/11/20 11:55 AM  Result Value Ref Range   Glucose-Capillary 149 (H) 70 - 99 mg/dL    Comment: Glucose reference range applies only to samples taken after fasting for at least 8 hours.  Glucose, capillary     Status: Abnormal   Collection Time: 05/11/20  3:56 PM  Result Value Ref Range   Glucose-Capillary 128 (H) 70 - 99 mg/dL    Comment: Glucose reference range applies only to samples taken after fasting for at least 8 hours.    DG Abd 1 View  Result Date: 05/11/2020 CLINICAL DATA:  Abdominal pain with nausea and vomiting EXAM: ABDOMEN - 1 VIEW COMPARISON:  May 06, 2020 FINDINGS: Stomach is distended with air. There is no appreciable small or large bowel dilatation. No air-fluid levels. No free air. Visualized lung bases clear. 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 IMPRESSION: Diffuse gastric dilatation with air. No small or large bowel dilatation. No free air. The appearance of the stomach raises  concern for a degree of gastric outlet obstruction. 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 Electronically Signed   By: Bretta Bang III M.D.   On: 05/11/2020 08:50   CT ABDOMEN PELVIS W CONTRAST  Result Date: 05/11/2020 CLINICAL DATA:  Bowel obstruction suspected, gastric outlet obstruction, worsening abdominal pain and distension EXAM: CT ABDOMEN AND PELVIS WITH CONTRAST TECHNIQUE: Multidetector CT imaging of the abdomen and pelvis was performed using the standard protocol following bolus administration of intravenous contrast. CONTRAST:  OMNIPAQUE IOHEXOL 300 MG/ML SOLN, additional oral enteric contrast COMPARISON:  CT abdomen, 05/07/2020, MR abdomen, 11/30/2019 FINDINGS: Lower chest: New small to moderate bilateral pleural effusions and associated atelectasis or consolidation. Hepatobiliary: No solid liver abnormality is seen. No gallstones, gallbladder wall thickening, or biliary dilatation. Pancreas: Unremarkable. No pancreatic ductal dilatation or surrounding inflammatory changes. Spleen: Normal in size without significant abnormality. Adrenals/Urinary Tract: Benign left adrenal adenoma, previously characterized by MR. Mild bilateral hydronephrosis and hydroureter, similar to prior examination. The urinary bladder is thickened and decompressed by a Foley catheter. Stomach/Bowel: The stomach is distended by air, fluid, and ingested contrast, with somewhat unusual non dependent air loculations of the gastric fundus, likely related  to layering ingested food material. Appendix appears normal. No evidence of bowel wall thickening, distention, or inflammatory changes. Vascular/Lymphatic: Aortic atherosclerosis. There are abnormally enlarged retroperitoneal, bilateral iliac, and pelvic sidewall lymph nodes, enlarged compared to prior examination dated 04/30/2020. The largest right iliac node measures 2.5 x 1.5 cm (series 2, image 64).  Reproductive: Gross prostatomegaly. Other: Anasarca. Trace ascites. Musculoskeletal: No acute or significant osseous findings. IMPRESSION: 1. No definite CT evidence of bowel or gastric outlet obstruction the stomach is distended by air, fluid, and ingested contrast, with somewhat unusual dependent air loculations of the gastric fundus, concerning for pneumatosis although possibly related to ingested material as true gastric pneumatosis is unusual. Consider short interval follow-up CT or surgical consultation as indicated by clinical concern. 2. There is oral enteric contrast passage to the small bowel, excluding high-grade gastric outlet obstruction. 3. New small to moderate bilateral pleural effusions and associated atelectasis or consolidation. 4. There are abnormally enlarged retroperitoneal, bilateral iliac, and pelvic sidewall lymph nodes, significantly enlarged compared to prior examination dated 04/27/2020 and likely reactive given short interval change. 5. Gross prostatomegaly. The urinary bladder is thickened and decompressed by a Foley catheter. Mild bilateral hydronephrosis and hydroureter, similar to prior examination likely related to chronic outlet obstruction and back pressure. 6. Small volume ascites, anasarca, and pleural effusions. 7. Aortic Atherosclerosis (ICD10-I70.0). These results will be called to the ordering clinician or representative by the Radiologist Assistant, and communication documented in the PACS or [employer]. Electronically Signed   By: Lauralyn Primes M.D.   On: 05/11/2020 16:59    Review of Systems  Constitutional: Positive for appetite change.  HENT: Negative for ear discharge, ear pain, hearing loss and tinnitus.   Eyes: Negative for photophobia and pain.  Respiratory: Negative for cough and shortness of breath.   Cardiovascular: Negative for chest pain.  Gastrointestinal: Positive for abdominal distention and abdominal pain. Negative for nausea and vomiting.   Genitourinary: Negative for dysuria, flank pain, frequency and urgency.  Musculoskeletal: Negative for back pain, myalgias and neck pain.  Neurological: Negative for dizziness and headaches.  Hematological: Does not bruise/bleed easily.  Psychiatric/Behavioral: The patient is not nervous/anxious.    Blood pressure (!) 162/83, pulse (!) 110, temperature 99.1 F (37.3 C), temperature source Oral, resp. rate (!) 32, height 5\' 10"  (1.778 m), weight 118 kg, SpO2 99 %. Physical Exam Constitutional:  WDWN in NAD, conversant, no obvious deformities; lying in bed comfortably Eyes:  Pupils equal, round; sclera anicteric; moist conjunctiva; no lid lag HENT:  Oral mucosa moist; good dentition  Neck:  No masses palpated, trachea midline; no thyromegaly Lungs:  CTA bilaterally; normal respiratory effort CV:  Tachycardic, regular rhythm; no murmurs; extremities well-perfused with no edema Abd:  Obese, distended.  Not rigid.  No guarding.  Tender in epigastrium without peritoneal signs Musc:  Unable to assess gait; no apparent clubbing or cyanosis in extremities Lymphatic:  No palpable cervical or axillary lymphadenopathy Skin:  Warm, dry; no sign of jaundice Psychiatric - alert and oriented x 4; calm mood and affect  Assessment/Plan: Gastric distention - likely from gastroparesis exacerbated by sepsis vs. Gastric outlet obstruction (partial).    Recs:   NPO NG tube as soon as possible to decompress the stomach GI consult for EGD  No acute surgical indications.  Dametrius Sanjuan 05/11/2020, 7:06 PM

## 2020-05-11 NOTE — Plan of Care (Signed)
  Problem: Education: Goal: Knowledge of General Education information will improve Description Including pain rating scale, medication(s)/side effects and non-pharmacologic comfort measures Outcome: Progressing   

## 2020-05-11 NOTE — NC FL2 (Signed)
[place] LEVEL OF CARE SCREENING TOOL     IDENTIFICATION  Patient Name: Ethan Johnson Birthdate: [birth year] Sex: male Admission Date (Current Location): 05/22/2020  [facility] and [place] Number:  Herbalist and Address:  [facility] [place] [address] [place], [place] [address]      Provider Number: [id number]  Attending Physician Name and Address:  Velna Ochs, MD  Relative Name and Phone Number:  Earley Brooke, spouse, [phone]    Current Level of Care: Hospital Recommended Level of Care: [place] Prior Approval Number:    Date Approved/Denied:   PASRR Number: [id number]  Discharge Plan: SNF    Current Diagnoses: Patient Active Problem List   Diagnosis Date Noted  . Obstructive uropathy 05/08/2020  . AKI (acute kidney injury) ([place])   . Bladder outlet obstruction   . Urinary tract infection with hematuria   . SBO (small bowel obstruction) ([place]) 07/13/2017    Orientation RESPIRATION BLADDER Height & Weight     Self, Place  O2(Nasal cannula 2L) Continent, Indwelling catheter Weight: 260 lb 2.3 oz (118 kg) Height:  5\' 10"  (177.8 cm)  BEHAVIORAL SYMPTOMS/MOOD NEUROLOGICAL BOWEL NUTRITION STATUS      Incontinent Diet(Please see DC Summary)  AMBULATORY STATUS COMMUNICATION OF NEEDS Skin   Extensive Assist   Normal                       Personal Care Assistance Level of Assistance  Bathing, Feeding, Dressing Bathing Assistance: Maximum assistance Feeding assistance: Independent Dressing Assistance: Maximum assistance     Functional Limitations Info  Sight, Hearing, Speech Sight Info: Adequate Hearing Info: Adequate Speech Info: Adequate    SPECIAL CARE FACTORS FREQUENCY  PT (By licensed PT), OT (By licensed OT)     PT Frequency: 5x/week OT Frequency: 4x/week            Contractures Contractures Info: Not present    Additional Factors Info  Code Status, Allergies Code Status  Info: Partial Allergies Info: NKA           Current Medications (05/11/2020):  This is the current hospital active medication list Current Facility-Administered Medications  Medication Dose Route Frequency Provider Last Rate Last Admin  . acetaminophen (TYLENOL) tablet 650 mg  650 mg Oral Q6H PRN Ina Homes, MD   650 mg at 05/11/20 8299   Or  . acetaminophen (TYLENOL) suppository 650 mg  650 mg Rectal Q6H PRN Ina Homes, MD      . amitriptyline (ELAVIL) tablet 10 mg  10 mg Oral QHS Ina Homes, MD   10 mg at 05/10/20 2208  . aspirin chewable tablet 81 mg  81 mg Oral Daily Ina Homes, MD   81 mg at 05/11/20 0847  . atorvastatin (LIPITOR) tablet 40 mg  40 mg Oral QHS Ina Homes, MD   40 mg at 05/10/20 2211  . ceFAZolin (ANCEF) IVPB 2g/100 mL premix  2 g Intravenous Q8H Velna Ochs, MD 200 mL/hr at 05/11/20 0528 2 g at 05/11/20 0528  . Chlorhexidine Gluconate Cloth 2 % PADS 6 each  6 each Topical Daily Velna Ochs, MD   6 each at 05/10/20 0749  . ferrous sulfate tablet 325 mg  325 mg Oral Q breakfast Ina Homes, MD   325 mg at 05/11/20 0846  . folic acid (FOLVITE) tablet 1 mg  1 mg Oral Daily Ina Homes, MD   1 mg at 05/11/20 0846  . heparin  injection 5,000 Units  5,000 Units Subcutaneous Q8H Levora Dredge, MD   5,000 Units at 05/11/20 0528  . HYDROmorphone (DILAUDID) injection 1 mg  1 mg Intravenous Q2H PRN Levora Dredge, MD   1 mg at 05/10/20 0745  . insulin aspart (novoLOG) injection 0-5 Units  0-5 Units Subcutaneous QHS Levora Dredge, MD   2 Units at 06/04/2020 2252  . insulin aspart (novoLOG) injection 0-9 Units  0-9 Units Subcutaneous TID WC Levora Dredge, MD   2 Units at 05/11/20 0847  . lactated ringers bolus 1,000 mL  1,000 mL Intravenous Once Levora Dredge, MD   Stopped at 2020-06-04 9417  . lactated ringers infusion   Intravenous Continuous Verdene Lennert, MD 200 mL/hr at 05/11/20 0850 New Bag at 05/11/20 0850  . lip balm (CARMEX)  ointment   Topical PRN Reymundo Poll, MD      . MEDLINE mouth rinse  15 mL Mouth Rinse BID Reymundo Poll, MD   15 mL at 05/11/20 0847  . ondansetron (ZOFRAN) tablet 4 mg  4 mg Oral Q6H PRN Levora Dredge, MD       Or  . ondansetron (ZOFRAN) injection 4 mg  4 mg Intravenous Q6H PRN Levora Dredge, MD   4 mg at 05/08/20 2144  . opium-belladonna (B&O) suppository 16.2-60mg   0.5 suppository Rectal Q6H PRN Legrand Pitts, RPH   0.5 suppository at 05/07/20 0352  . polyethylene glycol (MIRALAX / GLYCOLAX) packet 17 g  17 g Oral Daily PRN Helberg, Jill Alexanders, MD      . prazosin (MINIPRESS) capsule 1 mg  1 mg Oral QHS Helberg, Justin, MD   1 mg at 05/10/20 2212  . ramelteon (ROZEREM) tablet 8 mg  8 mg Oral QHS Albertha Ghee, MD      . simethicone [facility]) chewable tablet 80 mg  80 mg Oral BID Dolan Amen, MD   80 mg at 05/11/20 0847  . tamsulosin (FLOMAX) capsule 0.4 mg  0.4 mg Oral Daily Levora Dredge, MD   0.4 mg at 05/11/20 0848  . venlafaxine XR (EFFEXOR-XR) 24 hr capsule 225 mg  225 mg Oral Q breakfast Levora Dredge, MD   225 mg at 05/11/20 0848     Discharge Medications: Please see discharge summary for a list of discharge medications.  Relevant Imaging Results:  Relevant Lab Results:   Additional Information SSN: [id number]              COVID negative on 2020-06-04  Mearl Latin, LCSW

## 2020-05-12 ENCOUNTER — Encounter (HOSPITAL_COMMUNITY): Admission: EM | Disposition: E | Payer: Self-pay | Source: Home / Self Care | Attending: Pulmonary Disease

## 2020-05-12 ENCOUNTER — Inpatient Hospital Stay (HOSPITAL_COMMUNITY): Payer: No Typology Code available for payment source | Admitting: Certified Registered"

## 2020-05-12 ENCOUNTER — Encounter (HOSPITAL_COMMUNITY): Payer: Self-pay | Admitting: Internal Medicine

## 2020-05-12 DIAGNOSIS — R112 Nausea with vomiting, unspecified: Secondary | ICD-10-CM

## 2020-05-12 DIAGNOSIS — K2970 Gastritis, unspecified, without bleeding: Secondary | ICD-10-CM

## 2020-05-12 HISTORY — PX: LAPAROTOMY: SHX154

## 2020-05-12 HISTORY — PX: ESOPHAGOGASTRODUODENOSCOPY (EGD) WITH PROPOFOL: SHX5813

## 2020-05-12 LAB — BASIC METABOLIC PANEL
Anion gap: 13 (ref 5–15)
BUN: 16 mg/dL (ref 8–23)
CO2: 22 mmol/L (ref 22–32)
Calcium: 7.8 mg/dL — ABNORMAL LOW (ref 8.9–10.3)
Chloride: 103 mmol/L (ref 98–111)
Creatinine, Ser: 1.24 mg/dL (ref 0.61–1.24)
GFR calc Af Amer: >60 mL/min (ref >60)
GFR calc non Af Amer: 57 mL/min — ABNORMAL LOW (ref >60)
Glucose, Bld: 147 mg/dL — ABNORMAL HIGH (ref 70–99)
Potassium: 4.4 mmol/L (ref 3.5–5.1)
Sodium: 138 mmol/L (ref 135–145)

## 2020-05-12 LAB — CBC
HCT: 31.9 % — ABNORMAL LOW (ref 39.0–52.0)
Hemoglobin: 10.5 g/dL — ABNORMAL LOW (ref 13.0–17.0)
MCH: 27.6 pg (ref 26.0–34.0)
MCHC: 32.9 g/dL (ref 30.0–36.0)
MCV: 83.7 fL (ref 80.0–100.0)
Platelets: 236 10*3/uL (ref 150–400)
RBC: 3.81 MIL/uL — ABNORMAL LOW (ref 4.22–5.81)
RDW: 15.1 % (ref 11.5–15.5)
WBC: 27.2 10*3/uL — ABNORMAL HIGH (ref 4.0–10.5)
nRBC: 0.1 % (ref 0.0–0.2)

## 2020-05-12 LAB — GLUCOSE, CAPILLARY
Glucose-Capillary: 175 mg/dL — ABNORMAL HIGH (ref 70–99)
Glucose-Capillary: 137 mg/dL — ABNORMAL HIGH (ref 70–99)
Glucose-Capillary: 113 mg/dL — ABNORMAL HIGH (ref 70–99)
Glucose-Capillary: 164 mg/dL — ABNORMAL HIGH (ref 70–99)

## 2020-05-12 LAB — ABO/RH: ABO/RH(D): B NEG

## 2020-05-12 LAB — TYPE AND SCREEN
ABO/RH(D): B NEG
Antibody Screen: NEGATIVE

## 2020-05-12 LAB — TRIGLYCERIDES: Triglycerides: 193 mg/dL — ABNORMAL HIGH (ref <150)

## 2020-05-12 SURGERY — ESOPHAGOGASTRODUODENOSCOPY (EGD) WITH PROPOFOL
Anesthesia: General

## 2020-05-12 SURGERY — LAPAROTOMY, EXPLORATORY
Anesthesia: General | Site: Abdomen

## 2020-05-12 MED ORDER — LACTATED RINGERS IV SOLN: INTRAVENOUS | Status: DC | PRN

## 2020-05-12 MED ORDER — PROPOFOL 500 MG/50ML IV EMUL
INTRAVENOUS | Status: DC | PRN
  Administered 2020-05-12: 75 ug/kg/min via INTRAVENOUS

## 2020-05-12 MED ORDER — 0.9 % SODIUM CHLORIDE (POUR BTL) OPTIME
TOPICAL | Status: DC | PRN
  Administered 2020-05-12 (×5): 1000 mL

## 2020-05-12 MED ORDER — ORAL CARE MOUTH RINSE
15.0000 mL | OROMUCOSAL | Status: DC
  Administered 2020-05-12 – 2020-05-13 (×8): 15 mL via OROMUCOSAL

## 2020-05-12 MED ORDER — ONDANSETRON HCL 4 MG/2ML IJ SOLN
INTRAMUSCULAR | Status: AC
  Filled 2020-05-12: qty 2

## 2020-05-12 MED ORDER — CEFAZOLIN SODIUM 1 G IJ SOLR
INTRAMUSCULAR | Status: AC
  Filled 2020-05-12: qty 40

## 2020-05-12 MED ORDER — LACTATED RINGERS IV SOLN
INTRAVENOUS | Status: DC | PRN
Start: 2020-05-12 — End: 2020-05-12

## 2020-05-12 MED ORDER — SUCCINYLCHOLINE CHLORIDE 200 MG/10ML IV SOSY
PREFILLED_SYRINGE | INTRAVENOUS | Status: DC | PRN
  Administered 2020-05-12: 160 mg via INTRAVENOUS

## 2020-05-12 MED ORDER — PROPOFOL 1000 MG/100ML IV EMUL
5.0000 ug/kg/min | INTRAVENOUS | Status: DC
  Administered 2020-05-13: 35 ug/kg/min via INTRAVENOUS
  Administered 2020-05-13: 30 ug/kg/min via INTRAVENOUS
  Administered 2020-05-13: 50 ug/kg/min via INTRAVENOUS
  Filled 2020-05-12 (×3): qty 100
  Filled 2020-05-12: qty 200
  Filled 2020-05-12: qty 100

## 2020-05-12 MED ORDER — SODIUM CHLORIDE 0.9% FLUSH
10.0000 mL | Freq: Two times a day (BID) | INTRAVENOUS | Status: DC
  Administered 2020-05-13: 10 mL
  Administered 2020-05-13: 20 mL
  Administered 2020-05-13 – 2020-05-14 (×2): 10 mL
  Administered 2020-05-14: 30 mL
  Administered 2020-05-16 – 2020-05-17 (×2): 10 mL

## 2020-05-12 MED ORDER — LIDOCAINE 2% (20 MG/ML) 5 ML SYRINGE
INTRAMUSCULAR | Status: DC | PRN
  Administered 2020-05-12: 60 mg via INTRAVENOUS

## 2020-05-12 MED ORDER — LABETALOL HCL 5 MG/ML IV SOLN
10.0000 mg | INTRAVENOUS | Status: DC | PRN
  Administered 2020-05-12 – 2020-05-14 (×4): 10 mg via INTRAVENOUS
  Filled 2020-05-12 (×3): qty 4

## 2020-05-12 MED ORDER — PROPOFOL 10 MG/ML IV BOLUS
INTRAVENOUS | Status: DC | PRN
  Administered 2020-05-12: 40 mg via INTRAVENOUS
  Administered 2020-05-12: 100 mg via INTRAVENOUS

## 2020-05-12 MED ORDER — ONDANSETRON HCL 4 MG/2ML IJ SOLN
INTRAMUSCULAR | Status: DC | PRN
  Administered 2020-05-12: 4 mg via INTRAVENOUS

## 2020-05-12 MED ORDER — FENTANYL CITRATE (PF) 100 MCG/2ML IJ SOLN
50.0000 ug | INTRAMUSCULAR | Status: DC | PRN
  Administered 2020-05-12: 50 ug via INTRAVENOUS
  Filled 2020-05-12: qty 2

## 2020-05-12 MED ORDER — LABETALOL HCL 5 MG/ML IV SOLN
INTRAVENOUS | Status: AC
  Filled 2020-05-12: qty 4

## 2020-05-12 MED ORDER — FENTANYL CITRATE (PF) 250 MCG/5ML IJ SOLN
INTRAMUSCULAR | Status: DC | PRN
  Administered 2020-05-12: 250 ug via INTRAVENOUS

## 2020-05-12 MED ORDER — CHLORHEXIDINE GLUCONATE 0.12% ORAL RINSE (MEDLINE KIT)
15.0000 mL | Freq: Two times a day (BID) | OROMUCOSAL | Status: DC
  Administered 2020-05-12 – 2020-05-13 (×2): 15 mL via OROMUCOSAL

## 2020-05-12 MED ORDER — DEXAMETHASONE SODIUM PHOSPHATE 10 MG/ML IJ SOLN
INTRAMUSCULAR | Status: AC
  Filled 2020-05-12: qty 1

## 2020-05-12 MED ORDER — FENTANYL CITRATE (PF) 250 MCG/5ML IJ SOLN
INTRAMUSCULAR | Status: AC
  Filled 2020-05-12: qty 5

## 2020-05-12 MED ORDER — PROPOFOL 1000 MG/100ML IV EMUL
INTRAVENOUS | Status: AC
  Filled 2020-05-12: qty 100

## 2020-05-12 MED ORDER — STERILE WATER FOR IRRIGATION IR SOLN
Status: DC | PRN
Start: 2020-05-12 — End: 2020-05-12
  Administered 2020-05-12: 1000 mL

## 2020-05-12 MED ORDER — DEXAMETHASONE SODIUM PHOSPHATE 10 MG/ML IJ SOLN
INTRAMUSCULAR | Status: DC | PRN
  Administered 2020-05-12: 4 mg via INTRAVENOUS

## 2020-05-12 MED ORDER — PHENYLEPHRINE 40 MCG/ML (10ML) SYRINGE FOR IV PUSH (FOR BLOOD PRESSURE SUPPORT)
PREFILLED_SYRINGE | INTRAVENOUS | Status: DC | PRN
  Administered 2020-05-12: 80 ug via INTRAVENOUS

## 2020-05-12 MED ORDER — SODIUM CHLORIDE 0.9% FLUSH: 10.0000 mL | INTRAVENOUS | Status: DC | PRN

## 2020-05-12 MED ORDER — ROCURONIUM BROMIDE 10 MG/ML (PF) SYRINGE
PREFILLED_SYRINGE | INTRAVENOUS | Status: AC
  Filled 2020-05-12: qty 10

## 2020-05-12 MED ORDER — ROCURONIUM BROMIDE 10 MG/ML (PF) SYRINGE
PREFILLED_SYRINGE | INTRAVENOUS | Status: DC | PRN
  Administered 2020-05-12: 30 mg via INTRAVENOUS
  Administered 2020-05-12 (×2): 50 mg via INTRAVENOUS
  Administered 2020-05-12: 40 mg via INTRAVENOUS
  Administered 2020-05-12: 30 mg via INTRAVENOUS

## 2020-05-12 SURGICAL SUPPLY — 51 items
BAG URINE DRAIN 2000ML AR STRL (UROLOGICAL SUPPLIES) ×2 IMPLANT
BIOPATCH RED 1 DISK 7.0 (GAUZE/BANDAGES/DRESSINGS) ×2 IMPLANT
BLADE CLIPPER SURG (BLADE) ×2 IMPLANT
CANISTER SUCT 3000ML PPV (MISCELLANEOUS) ×2 IMPLANT
CHLORAPREP W/TINT 26 (MISCELLANEOUS) ×2 IMPLANT
DRAIN CHANNEL 19F RND (DRAIN) ×2 IMPLANT
DRAPE LAPAROSCOPIC ABDOMINAL (DRAPES) ×2 IMPLANT
DRAPE WARM FLUID 44X44 (DRAPES) ×2 IMPLANT
DRSG OPSITE POSTOP 4X10 (GAUZE/BANDAGES/DRESSINGS) ×2 IMPLANT
DRSG OPSITE POSTOP 4X8 (GAUZE/BANDAGES/DRESSINGS) ×2 IMPLANT
DRSG TEGADERM 2-3/8X2-3/4 SM (GAUZE/BANDAGES/DRESSINGS) ×2 IMPLANT
ELECT BLADE 6.5 EXT (BLADE) ×2 IMPLANT
ELECT CAUTERY BLADE 6.4 (BLADE) ×2 IMPLANT
ELECT REM PT RETURN 9FT ADLT (ELECTROSURGICAL) ×2
ELECTRODE REM PT RTRN 9FT ADLT (ELECTROSURGICAL) ×1 IMPLANT
GLOVE BIO SURGEON STRL SZ 6.5 (GLOVE) ×2 IMPLANT
GLOVE BIOGEL PI IND STRL 6 (GLOVE) ×1 IMPLANT
GLOVE BIOGEL PI INDICATOR 6 (GLOVE) ×1
GOWN STRL REUS W/ TWL LRG LVL3 (GOWN DISPOSABLE) ×2 IMPLANT
GOWN STRL REUS W/TWL LRG LVL3 (GOWN DISPOSABLE) ×2
HANDLE SUCTION POOLE (INSTRUMENTS) ×1 IMPLANT
KIT BASIN OR (CUSTOM PROCEDURE TRAY) ×2 IMPLANT
KIT TURNOVER KIT B (KITS) ×2 IMPLANT
LIGASURE IMPACT 36 18CM CVD LR (INSTRUMENTS) ×2 IMPLANT
NS IRRIG 1000ML POUR BTL (IV SOLUTION) ×8 IMPLANT
PACK GENERAL/GYN (CUSTOM PROCEDURE TRAY) ×2 IMPLANT
PAD ARMBOARD 7.5X6 YLW CONV (MISCELLANEOUS) ×2 IMPLANT
PENCIL SMOKE EVACUATOR (MISCELLANEOUS) ×2 IMPLANT
RELOAD PROXIMATE 75MM BLUE (ENDOMECHANICALS) ×2 IMPLANT
SPECIMEN JAR LARGE (MISCELLANEOUS) IMPLANT
SPONGE LAP 18X18 RF (DISPOSABLE) ×2 IMPLANT
STAPLER 90 3.5 STAND SLIM (STAPLE) ×2
STAPLER 90 3.5 STD SLIM (STAPLE) ×1 IMPLANT
STAPLER CUT CVD 40MM BLUE (STAPLE) ×2 IMPLANT
STAPLER PROXIMATE 75MM BLUE (STAPLE) ×2 IMPLANT
STAPLER VISISTAT 35W (STAPLE) ×4 IMPLANT
SUCTION POOLE HANDLE (INSTRUMENTS) ×2
SUT ETHILON 2 0 FS 18 (SUTURE) ×6 IMPLANT
SUT PDS AB 1 TP1 54 (SUTURE) ×4 IMPLANT
SUT PDS AB 1 TP1 96 (SUTURE) ×4 IMPLANT
SUT SILK 2 0 SH CR/8 (SUTURE) ×2 IMPLANT
SUT SILK 2 0 TIES 10X30 (SUTURE) ×2 IMPLANT
SUT SILK 3 0 SH CR/8 (SUTURE) ×2 IMPLANT
SUT SILK 3 0 TIES 10X30 (SUTURE) ×2 IMPLANT
SUT VIC AB 2-0 SH 27 (SUTURE) ×1
SUT VIC AB 2-0 SH 27XBRD (SUTURE) ×1 IMPLANT
SUT VIC AB 3-0 SH 18 (SUTURE) IMPLANT
SYR 20ML LL LF (SYRINGE) ×2 IMPLANT
TOWEL GREEN STERILE (TOWEL DISPOSABLE) ×2 IMPLANT
TUBE MOSS GAS 18FR (TUBING) ×2 IMPLANT
WATER STERILE IRR 1000ML POUR (IV SOLUTION) ×2 IMPLANT

## 2020-05-12 SURGICAL SUPPLY — 14 items

## 2020-05-12 NOTE — Progress Notes (Signed)
NAME:  Ethan Johnson, MRN:  [id number], DOB:  1946-11-09, LOS: 6 ADMISSION DATE:  05/20/2020, CONSULTATION DATE:  May 13, 2020 REFERRING MD:  Janee Morn - Trauma, CHIEF COMPLAINT:  Gastric infarction.   HPI/course in hospital  74 year old man who originally presented 6 days ago with abdominal pain and was found to have urinary retention in the context of worsening symptoms of prostatism and an elevated PSA. Symptoms improved following insertion of Foley and drainage of 1.5L of urine.  SVT at that time which was felt to be sinus tachycardia as no conversion with adenosine. Hypotension and tachycardia improved initially with relief of obstruction.   Found to have Klebsiella bacteremia which was treated with ceftriaxone. Transitioned to orals and was to be discharged with Foley in place for outpatient urology evaluation.  Noted to have gastric distension either due to outlet obstruction or gastroparesis, exacerbated by acute illness.   5/19 developed sudden abdominal pain with tachypnea and desaturation. Loose dark stools.   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   5/20 OR  -  Subtotal gastrectomy with placement of jejunostomy tube. Normotensive throughout the case, minimal blood loss, fluids given.  Returned to ICU intubated.   Past Medical History   Past Medical History:  Diagnosis Date  . Adrenal incidentaloma (HCC)   . Aortic atherosclerosis (HCC)   . BPH (benign prostatic hyperplasia)   . Diabetes mellitus without complication (HCC)   . Hepatic steatosis   . Neuropathy     History reviewed. No pertinent surgical history.      Interim history/subjective:  In ICU intubated and sedated.   Objective   Blood pressure (!) 178/73, pulse (!) 107, temperature 99.8 F (37.7 C), temperature source Oral, resp. rate (!) 21, height 5\' 10"  (1.778 m), weight 118 kg, SpO2 96 %.        Intake/Output Summary (Last 24 hours) at 05/13/2020 1617 Last data filed at  2020/05/13 1533 Gross per 24 hour  Intake 1600 ml  Output 4125 ml  Net -2525 ml   Filed Weights   05/13/2020 0229 05/11/20 0409 05/13/2020 1154  Weight: 100.2 kg 118 kg 118 kg    Examination: Physical Exam Constitutional:      Appearance: He is obese.     Interventions: He is sedated and intubated.  HENT:     Head:     Comments: NGT tube in place with minimal bloody drainage.      Mouth/Throat:     Mouth: Mucous membranes are moist.     Comments: ETT tube in place Eyes:     General: No scleral icterus. Cardiovascular:     Rate and Rhythm: Normal rate and regular rhythm.     Heart sounds: Normal heart sounds.  Pulmonary:     Effort: He is intubated.     Breath sounds: Normal breath sounds and air entry.  Abdominal:     General: A surgical scar is present. Bowel sounds are decreased.     Tenderness: There is no abdominal tenderness.       Comments: Clean mid line incision. Jejunostomy RUQ. LUQ JP with minimal bloody drainage.  Genitourinary:    Comments: Foley in place. Neurological:     Mental Status: He is unresponsive.     Comments: Currently sedated.       Ancillary tests (personally reviewed)  CBC: Recent Labs  Lab 05/09/2020 0239 05/07/20 05/09/20 05/08/20 0552 05/09/20 0647 05/10/20 0459 05/11/20 0331 05/13/2020 0629  WBC  11.8*   < > 23.7* 24.5* 20.6* 24.0* 27.2*  NEUTROABS 9.8*  --   --   --   --   --   --   HGB 12.9*   < > 9.4* 9.4* 9.8* 10.5* 10.5*  HCT 40.7   < > 29.2* 28.7* 30.1* 32.4* 31.9*  MCV 83.9   < > 83.0 82.7 83.4 83.9 83.7  PLT 224   < > 143* 153 168 175 236   < > = values in this interval not displayed.    Basic Metabolic Panel: Recent Labs  Lab 04/29/2020 1202 05/07/20 9371 05/08/20 0552 05/09/20 0647 05/10/20 0459 05/11/20 0331 06/10/20 0629  NA 137   < > 138 138 138 139 138  K 4.2   < > 3.6 3.6 3.4* 3.9 4.4  CL 103   < > 105 104 103 99 103  CO2 16*   < > 23 23 26 23 22   GLUCOSE 134*   < > 180* 198* 180* 159* 147*  BUN 29*   < >  35* 25* 20 18 16   CREATININE 3.47*   < > 1.59* 1.25* 1.08 1.06 1.24  CALCIUM 8.1*   < > 8.4* 8.2* 8.0* 8.0* 7.8*  PHOS 5.1*  --   --   --   --   --   --    < > = values in this interval not displayed.   GFR: Estimated Creatinine Clearance: 67.3 mL/min (by C-G formula based on SCr of 1.24 mg/dL). Recent Labs  Lab 04/28/2020 1202 05/01/2020 1600 05/07/20 0642 05/07/20 1422 05/07/20 1552 05/08/20 0552 05/08/20 0552 05/08/20 1553 05/09/20 0647 05/10/20 0459 05/11/20 0331 05/11/20 1846 05/11/20 2122 Jun 10, 2020 0629  PROCALCITON >150.00  --  145.79  --   --  71.68  --   --   --   --   --   --   --   --   WBC  --   --  24.0*   < >  --  23.7*   < >  --  24.5* 20.6* 24.0*  --   --  27.2*  LATICACIDVEN  --    < > 2.4*   < > 2.1*  --   --  1.5  --   --   --  1.4 1.4  --    < > = values in this interval not displayed.    Liver Function Tests: Recent Labs  Lab 05/20/2020 0239 05/22/2020 1202  AST 42*  --   ALT 24  --   ALKPHOS 102  --   BILITOT 1.3*  --   PROT 7.2  --   ALBUMIN 3.8 2.9*   Recent Labs  Lab 04/27/2020 0239  LIPASE 24   No results for input(s): AMMONIA in the last 168 hours.  ABG    Component Value Date/Time   TCO2 21 07/30/2014 2133     Coagulation Profile: No results for input(s): INR, PROTIME in the last 168 hours.  Cardiac Enzymes: No results for input(s): CKTOTAL, CKMB, CKMBINDEX, TROPONINI in the last 168 hours.  HbA1C: Hgb A1c MFr Bld  Date/Time Value Ref Range Status  05/19/2020 09:44 AM 7.6 (H) 4.8 - 5.6 % Final    Comment:    (NOTE) Pre diabetes:          5.7%-6.4% Diabetes:              >6.4% Glycemic control for   <7.0% adults with diabetes  CBG: Recent Labs  Lab 05/11/20 1155 05/11/20 1556 05/11/20 2250 05/22/2020 0809 05/04/2020 1220  GLUCAP 149* 128* 168* 175* 137*    Assessment & Plan:   Critically ill due to expected acute hypoxic respiratory failure following partial gastrectomy. No hemodynamic instability or intraoperative  fluid shifts. - Analgesia for upper abdominal incision.  - Pause sedation and attempt SBT and extubation.  - At increased risk of post-operative pulmonary complications.  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  - No manipulation of NGT -Start feeding via J-tube tomorrow.  Urinary retention due to BPH - Leave Foley in place.  Daily Goals Checklist  Pain/Anxiety/Delirium protocol (if indicated): fentanyl prn, stop propofol VAP protocol (if indicated): bundle in place Respiratory support goals: extubation today. Blood pressure target: MAP<90 DVT prophylaxis: Heparin TID Nutritional status and feeding goals: moderate risk, start tube feeds tomorrow. GI prophylaxis: Not required Fluid status goals: Allow autoregulation. Urinary catheter: urinary retention. Central lines: Arterial line, R IJ CVC Glucose control: well controlled on SSI. T2DM with HbA1C 7.6 Mobility/therapy needs: progressive ambulation following extubation.  Antibiotic de-escalation: perioperative antibiotics only. Home medication reconciliation: on hold Daily labs: CBC, BMET Code Status: full  Family Communication: surgery to update today. Disposition: ICU  CRITICAL CARE Performed by: Lynnell Catalan   Total critical care time: 35 minutes  Critical care time was exclusive of separately billable procedures and treating other patients.  Critical care was necessary to treat or prevent imminent or life-threatening deterioration.  Critical care was time spent personally by me on the following activities: development of treatment plan with patient and/or surrogate as well as nursing, discussions with consultants, evaluation of patient's response to treatment, examination of patient, obtaining history from patient or surrogate, ordering and performing treatments and interventions, ordering and review of laboratory studies, ordering and review of  radiographic studies, pulse oximetry, re-evaluation of patient's condition and participation in multidisciplinary rounds.  Lynnell Catalan, MD [facility] ICU Physician [facility]  Pager: [phone] Mobile: [phone] After hours: [phone].    05/18/2020, 4:17 PM

## 2020-05-12 NOTE — Progress Notes (Addendum)
[facility] Progress Note     Subjective: CC:  States he feels much better today, abdomen described as "a little tight". Denies emesis or pain. Reports a BM this AM and last night described as non-bloody and loose.  Objective: Vital signs in last 24 hours: Temp:  [98.2 F (36.8 C)-99.1 F (37.3 C)] 98.2 F (36.8 C) (05/20 1000) Pulse Rate:  [105-136] 108 (05/20 1000) Resp:  [20-34] 20 (05/20 1000) BP: (121-166)/(61-83) 155/68 (05/20 0739) SpO2:  [79 %-99 %] 91 % (05/20 1000) Last BM Date: 05/11/20  Intake/Output from previous day: 05/19 0701 - 05/20 0700 In: -  Out: 1800 [Urine:1800] Intake/Output this shift: Total I/O In: -  Out: 425 [Urine:425]  PE: Gen:  Alert, NAD, pleasant Card:  Regular rate and rhythm Pulm:  Slightly labored  Abd: Soft, significant distention and tympany, +BS, no tenderness or peritonitis  Skin: warm and dry, no rashes  Psych: A&Ox3   Lab Results:  Recent Labs    05/11/20 0331 04/29/2020 0629  WBC 24.0* 27.2*  HGB 10.5* 10.5*  HCT 32.4* 31.9*  PLT 175 236   BMET Recent Labs    05/11/20 0331 05/04/2020 0629  NA 139 138  K 3.9 4.4  CL 99 103  CO2 23 22  GLUCOSE 159* 147*  BUN 18 16  CREATININE 1.06 1.24  CALCIUM 8.0* 7.8*   PT/INR No results for input(s): LABPROT, INR in the last 72 hours. CMP     Component Value Date/Time   NA 138 05/19/2020 0629   K 4.4 05/08/2020 0629   CL 103 05/21/2020 0629   CO2 22 04/30/2020 0629   GLUCOSE 147 (H) 05/11/2020 0629   BUN 16 04/28/2020 0629   CREATININE 1.24 05/16/2020 0629   CALCIUM 7.8 (L) 05/13/2020 0629   PROT 7.2 04/25/2020 0239   ALBUMIN 2.9 (L) 05/11/2020 1202   AST 42 (H) 05/18/2020 0239   ALT 24 05/05/2020 0239   ALKPHOS 102 05/22/2020 0239   BILITOT 1.3 (H) 05/03/2020 0239   GFRNONAA 57 (L) 05/19/2020 0629   GFRAA >60 04/27/2020 0629   Lipase     Component Value Date/Time   LIPASE 24 05/11/2020 0239       Studies/Results: DG Abd 1 View  Result  Date: 05/11/2020 CLINICAL DATA:  Abdominal pain with nausea and vomiting EXAM: ABDOMEN - 1 VIEW COMPARISON:  May 06, 2020 FINDINGS: Stomach is distended with air. There is no appreciable small or large bowel dilatation. No air-fluid levels. No free air. Visualized lung bases clear. 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 IMPRESSION: Diffuse gastric dilatation with air. No small or large bowel dilatation. No free air. The appearance of the stomach raises concern for a degree of gastric outlet obstruction. 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 Electronically Signed   By: Lowella Grip III M.D.   On: 05/11/2020 08:50   CT ABDOMEN PELVIS W CONTRAST  Result Date: 05/11/2020 CLINICAL DATA:  Bowel obstruction suspected, gastric outlet obstruction, worsening abdominal pain and distension EXAM: CT ABDOMEN AND PELVIS WITH CONTRAST TECHNIQUE: Multidetector CT imaging of the abdomen and pelvis was performed using the standard protocol following bolus administration of intravenous contrast. CONTRAST:  171mL OMNIPAQUE IOHEXOL 300 MG/ML SOLN, additional oral enteric contrast COMPARISON:  CT abdomen, 05/06/2020, MR abdomen, 11/30/2019 FINDINGS: Lower chest: New small to moderate bilateral pleural effusions and associated atelectasis or  consolidation. Hepatobiliary: No solid liver abnormality is seen. No gallstones, gallbladder wall thickening, or biliary dilatation. Pancreas: Unremarkable. No pancreatic ductal dilatation or surrounding inflammatory changes. Spleen: Normal in size without significant abnormality. Adrenals/Urinary Tract: Benign left adrenal adenoma, previously characterized by MR. Mild bilateral hydronephrosis and hydroureter, similar to prior examination. The urinary bladder is thickened and decompressed by a Foley catheter. Stomach/Bowel: The stomach is distended by air,  fluid, and ingested contrast, with somewhat unusual non dependent air loculations of the gastric fundus, likely related to layering ingested food material. Appendix appears normal. No evidence of bowel wall thickening, distention, or inflammatory changes. Vascular/Lymphatic: Aortic atherosclerosis. There are abnormally enlarged retroperitoneal, bilateral iliac, and pelvic sidewall lymph nodes, enlarged compared to prior examination dated 05/05/2020. The largest right iliac node measures 2.5 x 1.5 cm (series 2, image 64). Reproductive: Gross prostatomegaly. Other: Anasarca. Trace ascites. Musculoskeletal: No acute or significant osseous findings. IMPRESSION: 1. No definite CT evidence of bowel or gastric outlet obstruction the stomach is distended by air, fluid, and ingested contrast, with somewhat unusual dependent air loculations of the gastric fundus, concerning for pneumatosis although possibly related to ingested material as true gastric pneumatosis is unusual. Consider short interval follow-up CT or surgical consultation as indicated by clinical concern. 2. There is oral enteric contrast passage to the small bowel, excluding high-grade gastric outlet obstruction. 3. New small to moderate bilateral pleural effusions and associated atelectasis or consolidation. 4. There are abnormally enlarged retroperitoneal, bilateral iliac, and pelvic sidewall lymph nodes, significantly enlarged compared to prior examination dated 05/16/2020 and likely reactive given short interval change. 5. Gross prostatomegaly. The urinary bladder is thickened and decompressed by a Foley catheter. Mild bilateral hydronephrosis and hydroureter, similar to prior examination likely related to chronic outlet obstruction and back pressure. 6. Small volume ascites, anasarca, and pleural effusions. 7. Aortic Atherosclerosis (ICD10-I70.0). These results will be called to the ordering clinician or representative by the Radiologist Assistant, and  communication documented in the PACS or [employer]. Electronically Signed   By: Lauralyn Primes M.D.   On: 05/11/2020 16:59    Anti-infectives: Anti-infectives (From admission, onward)   Start     Dose/Rate Route Frequency Ordered Stop   05/10/20 0600  ceFAZolin (ANCEF) IVPB 2g/100 mL premix     2 g 200 mL/hr over 30 Minutes Intravenous Every 8 hours 05/09/20 1542     05/07/20 1000  cefTRIAXone (ROCEPHIN) 2 g in sodium chloride 0.9 % 100 mL IVPB  Status:  Discontinued     2 g 200 mL/hr over 30 Minutes Intravenous Every 24 hours 05/05/2020 1036 05/09/20 1542   04/29/2020 0745  cefTRIAXone (ROCEPHIN) 2 g in sodium chloride 0.9 % 100 mL IVPB     2 g 200 mL/hr over 30 Minutes Intravenous  Once 05/22/2020 0730 05/22/2020 0955     Assessment/Plan Gastric distention - gastroparesis vs GOO - GI consulted and planning upper endoscopy today, discussed with Jennye Moccasin, PA and GI will place NG tube down in endoscopy  - recommend NG decompression, NPO - further recommendations per GI after endoscopy results  - will follow results from endoscopy   LOS: 6 days    Hosie Spangle, [facility] Please see Amion for pager number during day hours 7:00am-4:30pm

## 2020-05-12 NOTE — Progress Notes (Signed)
Report given to D. Cristela Felt, RN

## 2020-05-12 NOTE — Progress Notes (Addendum)
NG tube attempted to be placed x3 after ativan was given with no success.  Dr on call made aware.  Pt very agitated and wanting to go home.  Explained to the pt that he was in the hospital.  Pt confused to place and situation.  Pt redirected and reoriented to place and why he is here.  abd very distended and firm.  No bowel sounds noted.  flexicele removed and pt passed a lot of gas. Will continue to monitor pt.

## 2020-05-12 NOTE — Transfer of Care (Signed)
Immediate Anesthesia Transfer of Care Note  Patient: Ethan Johnson  Procedure(s) Performed: ESOPHAGOGASTRODUODENOSCOPY (EGD) WITH PROPOFOL (N/A ) SUBTOTAL GASTRECTOMY, JEJUNOSTOMY TUBE (N/A Abdomen)  Patient Location: ICU  Anesthesia Type:General  Level of Consciousness: Patient remains intubated per anesthesia plan  Airway & Oxygen Therapy: Patient remains intubated per anesthesia plan and Patient placed on Ventilator (see vital sign flow sheet for setting)  Post-op Assessment: Report given to RN and Post -op Vital signs reviewed and stable  Post vital signs: Reviewed and stable  Last Vitals:  Vitals Value Taken Time  BP 112/55 05/04/2020 1621  Temp    Pulse 96 04/23/2020 1617  Resp 15 05/11/2020 1632  SpO2 100 % 05/04/2020 1617  Vitals shown include unvalidated device data.  Last Pain:  Vitals:   05/05/2020 1154  TempSrc: Oral  PainSc: 7       Patients Stated Pain Goal: 0 (05/10/20 2000)  Complications: No apparent anesthesia complications

## 2020-05-12 NOTE — Progress Notes (Signed)
   04/27/2020 0739  Assess: MEWS Score  Temp 98.8 F (37.1 C)  BP (!) 155/68  Pulse Rate (!) 106  ECG Heart Rate (!) 106  Resp 20  Level of Consciousness Alert  SpO2 93 %  O2 Device Nasal Cannula  O2 Flow Rate (L/min) 3 L/min  Assess: MEWS Score  MEWS Temp 0  MEWS Systolic 0  MEWS Pulse 1  MEWS RR 1  MEWS LOC 0  MEWS Score 2  MEWS Score Color Yellow  Assess: if the MEWS score is Yellow or Red  Were vital signs taken at a resting state? Yes  Focused Assessment Documented focused assessment  Early Detection of Sepsis Score *See Row Information* Low  MEWS guidelines implemented *See Row Information* Yes  Take Vital Signs  Increase Vital Sign Frequency  Yellow: Q 2hr X 2 then Q 4hr X 2, if remains yellow, continue Q 4hrs  Escalate  MEWS: Escalate Yellow: discuss with charge nurse/RN and consider discussing with provider and RRT  Notify: Charge Nurse/RN  Name of Charge Nurse/RN Notified Shinita RN  Date Charge Nurse/RN Notified 05/05/2020  Time Charge Nurse/RN Notified 0800    Pt previously yellow/red MEWs all day yesterday. Shinita CN notified of pt. MEWs from elevated RR and HR. Will implement yellow MEWs protocol.

## 2020-05-12 NOTE — Anesthesia Postprocedure Evaluation (Signed)
Anesthesia Post Note  Patient: Ethan Johnson  Procedure(s) Performed: ESOPHAGOGASTRODUODENOSCOPY (EGD) WITH PROPOFOL (N/A ) SUBTOTAL GASTRECTOMY, JEJUNOSTOMY TUBE (N/A Abdomen)     Patient location during evaluation: ICU Anesthesia Type: General Level of consciousness: patient remains intubated per anesthesia plan Pain management: pain level controlled Vital Signs Assessment: post-procedure vital signs reviewed and stable Respiratory status: respiratory function stable and patient remains intubated per anesthesia plan Cardiovascular status: blood pressure returned to baseline Postop Assessment: no apparent nausea or vomiting Anesthetic complications: no    Last Vitals:  Vitals:   05/22/2020 1154 05/11/2020 1630  BP: (!) 178/73 (!) 175/47  Pulse: (!) 107 (!) 106  Resp: (!) 21 15  Temp: 37.7 C 36.5 C  SpO2: 96% 100%    Last Pain:  Vitals:   04/30/2020 1630  TempSrc: Axillary  PainSc:                  Kaylyn Layer

## 2020-05-12 NOTE — Anesthesia Preprocedure Evaluation (Addendum)
Anesthesia Evaluation  Patient identified by MRN, date of birth, ID band Patient awake    Reviewed: Allergy & Precautions, H&P , NPO status , Patient's Chart, lab work & pertinent test results  Airway Mallampati: II   Neck ROM: full    Dental  (+) Missing, Dental Advisory Given,    Pulmonary former smoker,    breath sounds clear to auscultation       Cardiovascular negative cardio ROS   Rhythm:regular Rate:Normal     Neuro/Psych    GI/Hepatic   Endo/Other  diabetes, Type 2  Renal/GU      Musculoskeletal   Abdominal   Peds  Hematology   Anesthesia Other Findings   Reproductive/Obstetrics                            Anesthesia Physical Anesthesia Plan  ASA: III  Anesthesia Plan: General   Post-op Pain Management:    Induction: Intravenous  PONV Risk Score and Plan: Propofol infusion and Treatment may vary due to age or medical condition  Airway Management Planned: Oral ETT  Additional Equipment:   Intra-op Plan:   Post-operative Plan:   Informed Consent: I have reviewed the patients History and Physical, chart, labs and discussed the procedure including the risks, benefits and alternatives for the proposed anesthesia with the patient or authorized representative who has indicated his/her understanding and acceptance.     Dental advisory given  Plan Discussed with: CRNA and Anesthesiologist  Anesthesia Plan Comments:        Anesthesia Quick Evaluation

## 2020-05-12 NOTE — Op Note (Addendum)
[name] Patient Name: Ethan Johnson Procedure Date : 05/10/2020 MRN: [id number] Attending MD: Rachael Fee , MD Date of Birth: 16-Feb-1946 CSN: [id number] Age: 74 Admit Type: Outpatient Procedure:                Upper GI endoscopy Indications:              Nausea with vomiting, CT scan shows dilated stomach                            suggesting possible GOO, abnormal proximal stomach Providers:                Rachael Fee, MDShanice Cain Sieve,                            Technician, Aundria Rud, CRNA Referring MD:              Medicines:                General Anesthesia Complications:            No immediate complications. Estimated blood loss:                            None. Estimated Blood Loss:     Estimated blood loss: none. Procedure:                Pre-Anesthesia Assessment:                           - Prior to the procedure, a History and Physical                            was performed, and patient medications and                            allergies were reviewed. The patient's tolerance of                            previous anesthesia was also reviewed. The risks                            and benefits of the procedure and the sedation                            options and risks were discussed with the patient.                            All questions were answered, and informed consent                            was obtained. Prior Anticoagulants: The patient has                            taken no previous anticoagulant or antiplatelet  agents. ASA Grade Assessment: IV - A patient with                            severe systemic disease that is a constant threat                            to life. After reviewing the risks and benefits,                            the patient was deemed in satisfactory condition to                            undergo the procedure.                           After obtaining  informed consent, the endoscope was                            passed under direct vision. Throughout the                            procedure, the patient's blood pressure, pulse, and                            oxygen saturations were monitored continuously. The                            GIF-H190 ([id number] Olympus gastroscope was                            introduced through the mouth, and advanced to the                            second part of duodenum. The upper GI endoscopy was                            accomplished without difficulty. The patient                            tolerated the procedure well. Scope In: Scope Out: Findings:      There was abnormal anatomy of the proximal stomach, which appeared       twisted. The mucosa within the twisted proximal stomach was necrotic       appearing. I supect the proximal 1/3 to 1/4 of the stomach is seriously       compromised.      The examination was otherwise normal. No evidence of gastric outlet       obstruction.      [facility] surgeon, Dr. Arnell Sieving graciously came to the endo       suite during the procedure to personally view the findings and she       concurred with my impression. 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                           - The examination was otherwisen normal. No                            evidence of gastric outlet obsrtuction.                           [facility] surgeon, Dr. Ellin Saba                            graciously came to the endo suite during the                            procedure to personally view the findings and she                            concurred with my impression. Recommendation:           - Surgical resection, repair. Today.                           - I spoke with his wife on the phone about the                            findings and the plan. Procedure  Code(s):        --- Professional ---                           [phone], Esophagogastroduodenoscopy, flexible,                            transoral; diagnostic, including collection of                            specimen(s) by brushing or washing, when performed                            (separate procedure) Diagnosis Code(s):        --- Professional ---                           K29.70, Gastritis, unspecified, without bleeding                           R11.2, Nausea with vomiting, unspecified CPT copyright 2019 American Medical Association. All rights reserved. The codes documented in this report are preliminary and upon coder review may  be revised to meet current compliance requirements. Milus Banister, MD 04/24/2020 1:11:55 PM This report has been signed electronically. Number of Addenda: 0

## 2020-05-12 NOTE — Progress Notes (Signed)
RT obtained ABG per protocol. Dr.Agarwala turned patient to CPAP/PSV for one hour before obtaining ABG.  ABG on CPAP/PSV 10/5 40% as follows: pH 7.18, CO2 74.9, PO2 62 and HCO3 28.5.   MD at bedside. RT placed patient back on PRVC with FiO2 at 60%. RT will continue to monitor.

## 2020-05-12 NOTE — Progress Notes (Signed)
   04/28/2020 0100  Assess: MEWS Score  Temp 98.4 F (36.9 C)  BP 121/61  Pulse Rate (!) 113  ECG Heart Rate (!) 111  Resp (!) 24  SpO2 97 %  O2 Device Nasal Cannula  O2 Flow Rate (L/min) 3 L/min  Assess: MEWS Score  MEWS Temp 0  MEWS Systolic 0  MEWS Pulse 2  MEWS RR 1  MEWS LOC 0  MEWS Score 3  MEWS Score Color Yellow    Patient alert and oriented only to self. Patient restless but unable to effectively communicate pain source. Titrated up to 3L O2 [place]. Given Dilaudid 1mg  for pain. Patient currently resting. Will monitor VS q 4 per protocol.

## 2020-05-12 NOTE — H&P (View-Only) (Signed)
[facility] Consult: 10:27 AM 05/08/2020  LOS: 6 days    Referring Provider: Dr Antony ContrasGuilloud  Primary Care Physician:  [facility] Primary Gastroenterologist:  unassigned     Reason for Consultation: Abdominal pain, GOO.   HPI: Ethan Johnson is a 74 y.o. male.  Past history listed below.  Presented to [facility] a week ago with abdominal pain.  Bladder outlet obstruction, sepsis, uremia/AKI from massively dilated prostate.  Home meds include folic acid and oral iron, 81 mg aspirin.   Previous E[facility] and [facility] at [facility].  May have had colon polyps.  No history of ulcer disease.  Has scheduled colonoscopy in June, next month, routine screening from the sounds of it.  Abdominal pain persists and 2nd CTAP w contrast pursued showing new,  massively dilated stomach, possible gastric pneumatosis versus ingested material.  Contrast does transit into small bowel.  Abnormally enlarged lymph nodes in the retroperitoneum, bilateral iliac, pelvic sidewall positions, significantly enlarged compared with CT of 05/12/2020.  Persistent prostamegaly.  Small ascites, anasarca and pleural effusions.  Staff has had difficulty placing NG tube for decompression.  On left nares the NG tube appeared to get stuck within the proximal nasal, sinus passage.  Right sided attempt resulted in NG approaching the throat at which point patient became very agitated, was coughing and displaying respiratory distress so attempt was aborted.  Labs show a stable, normocytic anemia.  Persistent elevation WBCs, 27.2 today.  AKI resolved.  Abdominal pain began 1 day prior to his admission late last week.  He had not had vomiting or nausea.  Abdominal pain diffuse.  He has not had significant pain for at least 24 hours but bloating persists.  No  issues at home with constipation, blood per rectum, GERD.  No NSAIDs.  No PPI at home.    Past Medical History:  Diagnosis Date  . Adrenal incidentaloma (HCC)   . Aortic atherosclerosis (HCC)   . BPH (benign prostatic hyperplasia)   . Diabetes mellitus without complication (HCC)   . Hepatic steatosis   . Neuropathy     History reviewed. No pertinent surgical history.  Prior to Admission medications   Medication Sig Start Date End Date Taking? Authorizing Provider  amitriptyline (ELAVIL) 10 MG tablet Take 10 mg by mouth at bedtime.   Yes [provider]  aspirin 81 MG chewable tablet Chew 81 mg by mouth daily.   Yes [provider]  atorvastatin (LIPITOR) 40 MG tablet Take 40 mg by mouth at bedtime.   Yes [provider]  ferrous sulfate 325 (65 FE) MG tablet Take 325 mg by mouth daily with breakfast.   Yes [provider]  folic acid (FOLVITE) 1 MG tablet Take 1 mg by mouth daily.   Yes [provider]  glipiZIDE (GLUCOTROL) 10 MG tablet Take 10 mg by mouth 2 (two) times daily before a meal.    Yes [provider]  metFORMIN (GLUCOPHAGE) 1000 MG tablet Take 1,000 mg by mouth 2 (two) times daily with a meal.  Yes [provider]  prazosin (MINIPRESS) 1 MG capsule Take 1 mg by mouth at bedtime.   Yes [provider]  tamsulosin (FLOMAX) 0.4 MG CAPS capsule Take 0.4 mg by mouth daily.   Yes [provider]  venlafaxine XR (EFFEXOR-XR) 75 MG 24 hr capsule Take 225 mg by mouth daily with breakfast.   Yes [provider]  methocarbamol (ROBAXIN) 500 MG tablet Take 1 tablet (500 mg total) by mouth at bedtime and may repeat dose one time if needed. Patient not taking: Reported on 05/14/2020 07/13/17   Bethel Born, PA-C    Scheduled Meds: . amitriptyline  10 mg Oral QHS  . aspirin  81 mg Oral Daily  . atorvastatin  40 mg Oral QHS  . Chlorhexidine Gluconate Cloth  6 each Topical Daily  . ferrous  sulfate  325 mg Oral Q breakfast  . folic acid  1 mg Oral Daily  . heparin  5,000 Units Subcutaneous Q8H  . insulin aspart  0-5 Units Subcutaneous QHS  . insulin aspart  0-9 Units Subcutaneous TID WC  . mouth rinse  15 mL Mouth Rinse BID  . pantoprazole (PROTONIX) IV  40 mg Intravenous Q12H  . prazosin  1 mg Oral QHS  . ramelteon  8 mg Oral QHS  . simethicone  80 mg Oral BID  . tamsulosin  0.4 mg Oral Daily  . venlafaxine XR  225 mg Oral Q breakfast   Infusions: .  ceFAZolin (ANCEF) IV 2 g (05/31/20 0636)  . lactated ringers 125 mL/hr at 05-31-20 1016   PRN Meds: acetaminophen **OR** acetaminophen, HYDROmorphone (DILAUDID) injection, lip balm, ondansetron **OR** ondansetron (ZOFRAN) IV, opium-belladonna, polyethylene glycol   Allergies as of 05/02/2020  . (No Known Allergies)    Family History  Problem Relation Age of Onset  . CAD Neg Hx     Social History   Socioeconomic History  . Marital status: Married    Spouse name: Not on file  . Number of children: Not on file  . Years of education: Not on file  . Highest education level: Not on file  Occupational History  . Not on file  Tobacco Use  . Smoking status: Former Games developer  . Smokeless tobacco: Never Used  . Tobacco comment: Smoked age [age]  Substance and Sexual Activity  . Alcohol use: No  . Drug use: No  . Sexual activity: Not on file  Other Topics Concern  . Not on file  Social History Narrative  . Not on file   Social Determinants of Health   Financial Resource Strain:   . Difficulty of Paying Living Expenses:   Food Insecurity:   . Worried About Programme researcher, broadcasting/film/video in the Last Year:   . Barista in the Last Year:   Transportation Needs:   . Freight forwarder (Medical):   Marland Kitchen Lack of Transportation (Non-Medical):   Physical Activity:   . Days of Exercise per Week:   . Minutes of Exercise per Session:   Stress:   . Feeling of Stress :   Social Connections:   . Frequency of  Communication with Friends and Family:   . Frequency of Social Gatherings with Friends and Family:   . Attends Religious Services:   . Active Member of Clubs or Organizations:   . Attends Banker Meetings:   Marland Kitchen Marital Status:   Intimate Partner Violence:   . Fear of Current or Ex-Partner:   . Emotionally  Abused:   Marland Kitchen Physically Abused:   . Sexually Abused:     REVIEW OF SYSTEMS: Constitutional: Feels better than he did when he came in.  Some weakness. ENT:  No nose bleeds Pulm: No shortness of breath, no cough. CV:  No palpitations, no LE edema.  No angina. GU: Foley catheter remains in place GI: See HPI. Heme: Denies unusual or excessive bleeding or bruising. Transfusions: Denies previous blood product transfusions. Neuro:  No headaches, no peripheral tingling or numbness.  No seizures, no syncope. Derm:  No itching, no rash or sores.  Endocrine:  No sweats or chills.  No polyuria or dysuria Immunization: Not queried.    PHYSICAL EXAM: Vital signs in last 24 hours: Vitals:   05/07/2020 0739 04/25/2020 0741  BP: (!) 155/68   Pulse: (!) 106   Resp: 20   Temp: 98.8 F (37.1 C) 98.3 F (36.8 C)  SpO2: 93%    Wt Readings from Last 3 Encounters:  05/11/20 118 kg  07/13/17 100.2 kg  07/30/14 103 kg    General: Obese, comfortable, alert.  Appears stated age.  Does not look acutely ill. Head: No facial asymmetry or swelling.  No signs of head trauma. Eyes: No scleral icterus.  Conjunctiva pale.  EOMI. Ears: Not hard of hearing Nose: No congestion or discharge Mouth: Tongue midline.  Mucosa and tongue are extremely dry, dried secretions on the tongue. Neck: No JVD, no masses, no thyromegaly. Lungs: Clear bilaterally, no labored breathing.   Heart: RRR.  No MRG.  S1, S2 present Abdomen: Not tender.  Distended, mild to moderately tense.  Not tender scant tinkling/tympanic bowel sounds.  Unable to appreciate hernias, bruits, organomegaly, masses. Rectal:  Deferred Musc/Skeltl: No gross joint deformities. Extremities: No CCE. Neurologic: Alert.  Appropriate.  Moves all 4 limbs, strength not tested.  No tremors.  No gross deficits. Skin: No rash, no sores Tattoos: None observed Nodes: No cervical adenopathy Psych: Calm, cooperative, pleasant.  Asks appropriate questions.  Intake/Output from previous day: 05/19 0701 - 05/20 0700 In: -  Out: 1800 [Urine:1800] Intake/Output this shift: Total I/O In: -  Out: 425 [Urine:425]  LAB RESULTS: Recent Labs    05/10/20 0459 05/11/20 0331 05/09/2020 0629  WBC 20.6* 24.0* 27.2*  HGB 9.8* 10.5* 10.5*  HCT 30.1* 32.4* 31.9*  PLT 168 175 236   BMET Lab Results  Component Value Date   NA 138 05/22/2020   NA 139 05/11/2020   NA 138 05/10/2020   K 4.4 05/09/2020   K 3.9 05/11/2020   K 3.4 (L) 05/10/2020   CL 103 05/18/2020   CL 99 05/11/2020   CL 103 05/10/2020   CO2 22 04/24/2020   CO2 23 05/11/2020   CO2 26 05/10/2020   GLUCOSE 147 (H) 05/13/2020   GLUCOSE 159 (H) 05/11/2020   GLUCOSE 180 (H) 05/10/2020   BUN 16 04/28/2020   BUN 18 05/11/2020   BUN 20 05/10/2020   CREATININE 1.24 05/11/2020   CREATININE 1.06 05/11/2020   CREATININE 1.08 05/10/2020   CALCIUM 7.8 (L) 05/19/2020   CALCIUM 8.0 (L) 05/11/2020   CALCIUM 8.0 (L) 05/10/2020   LFT No results for input(s): PROT, ALBUMIN, AST, ALT, ALKPHOS, BILITOT, BILIDIR, IBILI in the last 72 hours. PT/INR Lab Results  Component Value Date   INR 1.02 07/30/2014   Hepatitis Panel No results for input(s): HEPBSAG, HCVAB, HEPAIGM, HEPBIGM in the last 72 hours. C-Diff No components found for: CDIFF Lipase     Component Value Date/Time  LIPASE 24 05/05/2020 0239    Drugs of Abuse  No results found for: LABOPIA, COCAINSCRNUR, LABBENZ, AMPHETMU, THCU, LABBARB   RADIOLOGY STUDIES: DG Abd 1 View  Result Date: 05/11/2020 CLINICAL DATA:  Abdominal pain with nausea and vomiting EXAM: ABDOMEN - 1 VIEW COMPARISON:  May 06, 2020  FINDINGS: Stomach is distended with air. There is no appreciable small or large bowel dilatation. No air-fluid levels. No free air. Visualized lung bases clear. 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 IMPRESSION: Diffuse gastric dilatation with air. No small or large bowel dilatation. No free air. The appearance of the stomach raises concern for a degree of gastric outlet obstruction. 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 Electronically Signed   By: Lowella Grip III M.D.   On: 05/11/2020 08:50   CT ABDOMEN PELVIS W CONTRAST  Result Date: 05/11/2020 CLINICAL DATA:  Bowel obstruction suspected, gastric outlet obstruction, worsening abdominal pain and distension EXAM: CT ABDOMEN AND PELVIS WITH CONTRAST TECHNIQUE: Multidetector CT imaging of the abdomen and pelvis was performed using the standard protocol following bolus administration of intravenous contrast. CONTRAST:  137mL OMNIPAQUE IOHEXOL 300 MG/ML SOLN, additional oral enteric contrast COMPARISON:  CT abdomen, 05/14/2020, MR abdomen, 11/30/2019 FINDINGS: Lower chest: New small to moderate bilateral pleural effusions and associated atelectasis or consolidation. Hepatobiliary: No solid liver abnormality is seen. No gallstones, gallbladder wall thickening, or biliary dilatation. Pancreas: Unremarkable. No pancreatic ductal dilatation or surrounding inflammatory changes. Spleen: Normal in size without significant abnormality. Adrenals/Urinary Tract: Benign left adrenal adenoma, previously characterized by MR. Mild bilateral hydronephrosis and hydroureter, similar to prior examination. The urinary bladder is thickened and decompressed by a Foley catheter. Stomach/Bowel: The stomach is distended by air, fluid, and ingested contrast, with somewhat unusual non dependent air loculations of the gastric fundus, likely related to  layering ingested food material. Appendix appears normal. No evidence of bowel wall thickening, distention, or inflammatory changes. Vascular/Lymphatic: Aortic atherosclerosis. There are abnormally enlarged retroperitoneal, bilateral iliac, and pelvic sidewall lymph nodes, enlarged compared to prior examination dated 05/02/2020. The largest right iliac node measures 2.5 x 1.5 cm (series 2, image 64). Reproductive: Gross prostatomegaly. Other: Anasarca. Trace ascites. Musculoskeletal: No acute or significant osseous findings. IMPRESSION: 1. No definite CT evidence of bowel or gastric outlet obstruction the stomach is distended by air, fluid, and ingested contrast, with somewhat unusual dependent air loculations of the gastric fundus, concerning for pneumatosis although possibly related to ingested material as true gastric pneumatosis is unusual. Consider short interval follow-up CT or surgical consultation as indicated by clinical concern. 2. There is oral enteric contrast passage to the small bowel, excluding high-grade gastric outlet obstruction. 3. New small to moderate bilateral pleural effusions and associated atelectasis or consolidation. 4. There are abnormally enlarged retroperitoneal, bilateral iliac, and pelvic sidewall lymph nodes, significantly enlarged compared to prior examination dated 05/08/2020 and likely reactive given short interval change. 5. Gross prostatomegaly. The urinary bladder is thickened and decompressed by a Foley catheter. Mild bilateral hydronephrosis and hydroureter, similar to prior examination likely related to chronic outlet obstruction and back pressure. 6. Small volume ascites, anasarca, and pleural effusions. 7. Aortic Atherosclerosis (ICD10-I70.0). These results will be called to the ordering clinician or representative by the Radiologist Assistant, and communication documented in the PACS or [employer]. Electronically Signed   By: Eddie Candle M.D.   On: 05/11/2020 16:59  IMPRESSION:   *    Gastric outlet obstruction, not complete as contrast does enter small bowel on yesterday's CT. Staff unable to place NG tube Surgery following as of yesterday when the rec'd NGT decompression.    *     Prostamegaly causing bladder outlet obstruction.  Associated uremia and AKI resolved.    PLAN:     *   EGD this afternoon.  Hopefully Dr. Christella Hartigan can place an NG tube after the procedure is completed.   Jennye Moccasin  05/11/2020, 10:27 AM Phone [phone]  ________________________________________________________________________  [facility] MD note:  I personally examined the patient, reviewed the data and agree with the assessment and plan described above. I am planning EGD this afternoon for his partial GOO. Will hopefully be able to leave NG in place afterwards.  Rob Bunting, MD [facility] Pager [phone]

## 2020-05-12 NOTE — Anesthesia Procedure Notes (Signed)
Arterial Line Insertion Start/End05/07/2020 1:10 PM, 05/16/2020 1:20 PM Performed by: Jodell Cipro, CRNA, CRNA  Patient location: OOR procedure area. Preanesthetic checklist: patient identified and IV checked Emergency situation Patient sedated Left, radial was placed Catheter size: 20 G Hand hygiene performed  and maximum sterile barriers used  Allen's test indicative of satisfactory collateral circulation Attempts: 1 Procedure performed without using ultrasound guided technique. Following insertion, dressing applied and Biopatch. Post procedure assessment: normal  Patient tolerated the procedure well with no immediate complications.

## 2020-05-12 NOTE — Progress Notes (Signed)
Per nurse report, NG attempted x2-3 with no success. MD notified and stated plan will be updated in AM and it is okay to hold off overnight. Pt placed on bedpan this AM, no successful BM. Peri care given and moisture barrier applied. Patient repositioned and face washed. Attempted calling wife and son this AM with no success. Patient requesting water, reiterated NPO status and provided oral swabs.

## 2020-05-12 NOTE — Progress Notes (Addendum)
Subjective:  ON Events: NG Tube attempted to be placed 2-3 times without success.   Patient is feeling better today. States that his belly is sore. We discussed that since we were unable to get an NG tube placed we have called GI to come see him for possible decompression with EGD + NG tube placement. He states that he did pass some flatus and his last BM was this AM, it was described as normal in appearance.   Objective:  Vital signs in last 24 hours: Vitals:   05/05/2020 0003 05/11/2020 0100 04/24/2020 0300 05/22/2020 0412  BP: (!) 143/80 121/61 137/75   Pulse: (!) 114 (!) 113 (!) 106   Resp: (!) 21 (!) 24 20   Temp: 98.4 F (36.9 C) 98.4 F (36.9 C)  98.4 F (36.9 C)  TempSrc: Oral Oral  Oral  SpO2: 97% 97% 98%   Weight:      Height:       Physical Exam Constitutional:      Appearance: He is obese.  Cardiovascular:     Rate and Rhythm: Regular rhythm. Tachycardia present.     Heart sounds: No murmur. No friction rub. No gallop.   Pulmonary:     Effort: Pulmonary effort is normal.     Breath sounds: Normal breath sounds. No wheezing, rhonchi or rales.  Abdominal:     General: Bowel sounds are normal.     Tenderness: There is no guarding or rebound.     Comments: Abdomen distended, BS +, some tenderness to palpation.   Genitourinary:    Testes:        Right: Tenderness not present.        Left: Tenderness not present.  Neurological:     Mental Status: He is alert and oriented to person, place, and time.  Psychiatric:        Mood and Affect: Mood normal.        Behavior: Behavior normal.     CBC Latest Ref Rng & Units 05/11/2020 05/10/2020 05/09/2020  WBC 4.0 - 10.5 K/uL 24.0(H) 20.6(H) 24.5(H)  Hemoglobin 13.0 - 17.0 g/dL 10.5(L) 9.8(L) 9.4(L)  Hematocrit 39.0 - 52.0 % 32.4(L) 30.1(L) 28.7(L)  Platelets 150 - 400 K/uL 175 168 153   BMP Latest Ref Rng & Units 05/11/2020 05/10/2020 05/09/2020  Glucose 70 - 99 mg/dL 159(H) 180(H) 198(H)  BUN 8 - 23 mg/dL 18 20 25(H)    Creatinine 0.61 - 1.24 mg/dL 1.06 1.08 1.25(H)  Sodium 135 - 145 mmol/L 139 138 138  Potassium 3.5 - 5.1 mmol/L 3.9 3.4(L) 3.6  Chloride 98 - 111 mmol/L 99 103 104  CO2 22 - 32 mmol/L 23 26 23   Calcium 8.9 - 10.3 mg/dL 8.0(L) 8.0(L) 8.2(L)   Assessment/Plan:  Active Problems:   Obstructive uropathy   Gastric outlet obstruction  Gram Negative Bacteremia 2/2 to Obstructive Uropathy by Enlarged Prostate Acute Kidney Injury in setting of Obstructive Uropathy (Resolved)  Sinus Tachycardia 2/2 to Uropathy and complicated UTI/Sepsis High anion gap metabolic acid 2/2 to AKI (Resolved):  - Leukocytosis has trended up to 27 from 24 the previous day, tachycardia is also increasing, likely secondary to his dilated stomach. Hopefully symptoms will alleviate with decompression.   - Cr: 1.24 - WBC: 27.2  - CBC - Trend BMP - Dropped fluid resuscitation LR 126mL/hr - Cefazolin 2g IV q8 - Continue tamsulosin - Gas X  - PT recommending SNF placement  Abdominal Pain:  Patient with persistent complaints of abdominal pain  now with decreased appetite. Xray ordered which showed vastly dilated stomach which is concerning for gastric outlet obstruction, follow up follow up CT showed massively dilated stomach with signs concerning for gastric pneumatosis vs ingested material vs gastroparesis. There is contrast passed the small bowel. Multiple attempts made to pass NG tube, including use of sedative, without success. Will reach out to gastroenterology for assistance with decompression.   - Diet NPO - IV Protonix BID - Consulted GI for assistance with decompression vs EGD.   Diabetes Mellitis:  Home medications of metformin and glipizide.  - SSI while hospitalized  Insomnia:  - Continue prazosin and amitriptyline - Continue venlafaxine   Prior to Admission Living Arrangement: Home Anticipated Discharge Location: SNF Barriers to Discharge: Continue Medical treatment Dispo: Anticipated discharge in  approximately 3-4 day(s).   Dolan Amen, MD 04/26/2020, 7:30 AM Pager: [phone]

## 2020-05-12 NOTE — Consult Note (Addendum)
                                                                           [facility] Consult: 10:27 AM 05/10/2020  LOS: 6 days    Referring Provider: Dr Guilloud  Primary Care Physician:  [facility] Primary Gastroenterologist:  unassigned     Reason for Consultation: Abdominal pain, GOO.   HPI: Ethan Johnson is a 74 y.o. male.  Past history listed below.  Presented to [facility] a week ago with abdominal pain.  Bladder outlet obstruction, sepsis, uremia/AKI from massively dilated prostate.  Home meds include folic acid and oral iron, 81 mg aspirin.   [facility]  May have had colon polyps.  No history of ulcer disease.  Has scheduled colonoscopy in June, next month, routine screening from the sounds of it.  Abdominal pain persists and 2nd CTAP w contrast pursued showing new,  massively dilated stomach, possible gastric pneumatosis versus ingested material.  Contrast does transit into small bowel.  Abnormally enlarged lymph nodes in the retroperitoneum, bilateral iliac, pelvic sidewall positions, significantly enlarged compared with CT of 05/09/2020.  Persistent prostamegaly.  Small ascites, anasarca and pleural effusions.  Staff has had difficulty placing NG tube for decompression.  On left nares the NG tube appeared to get stuck within the proximal nasal, sinus passage.  Right sided attempt resulted in NG approaching the throat at which point patient became very agitated, was coughing and displaying respiratory distress so attempt was aborted.  Labs show a stable, normocytic anemia.  Persistent elevation WBCs, 27.2 today.  AKI resolved.  Abdominal pain began 1 day prior to his admission late last week.  He had not had vomiting or nausea.  Abdominal pain diffuse.  He has not had significant pain for at least 24 hours but bloating persists.  No  issues at home with constipation, blood per rectum, GERD.  No NSAIDs.  No PPI at home.    Past Medical History:  Diagnosis Date  . Adrenal incidentaloma (HCC)   . Aortic atherosclerosis (HCC)   . BPH (benign prostatic hyperplasia)   . Diabetes mellitus without complication (HCC)   . Hepatic steatosis   . Neuropathy     History reviewed. No pertinent surgical history.  Prior to Admission medications   Medication Sig Start Date End Date Taking? Authorizing Provider  amitriptyline (ELAVIL) 10 MG tablet Take 10 mg by mouth at bedtime.   Yes [provider]  aspirin 81 MG chewable tablet Chew 81 mg by mouth daily.   Yes [provider]  atorvastatin (LIPITOR) 40 MG tablet Take 40 mg by mouth at bedtime.   Yes [provider]  ferrous sulfate 325 (65 FE) MG tablet Take 325 mg by mouth daily with breakfast.   Yes [provider]  folic acid (FOLVITE) 1 MG tablet Take 1 mg by mouth daily.   Yes [provider]  glipiZIDE (GLUCOTROL) 10 MG tablet Take 10 mg by mouth 2 (two) times daily before a meal.    Yes [provider]  metFORMIN (GLUCOPHAGE) 1000 MG tablet Take 1,000 mg by mouth 2 (two) times daily with a meal.     Yes [provider]  prazosin (MINIPRESS) 1 MG capsule Take 1 mg by mouth at bedtime.   Yes [provider]  tamsulosin (FLOMAX) 0.4 MG CAPS capsule Take 0.4 mg by mouth daily.   Yes [provider]  venlafaxine XR (EFFEXOR-XR) 75 MG 24 hr capsule Take 225 mg by mouth daily with breakfast.   Yes [provider]  methocarbamol (ROBAXIN) 500 MG tablet Take 1 tablet (500 mg total) by mouth at bedtime and may repeat dose one time if needed. Patient not taking: Reported on 05/14/2020 07/13/17   Bethel Born, PA-C    Scheduled Meds: . amitriptyline  10 mg Oral QHS  . aspirin  81 mg Oral Daily  . atorvastatin  40 mg Oral QHS  . Chlorhexidine Gluconate Cloth  6 each Topical Daily  . ferrous  sulfate  325 mg Oral Q breakfast  . folic acid  1 mg Oral Daily  . heparin  5,000 Units Subcutaneous Q8H  . insulin aspart  0-5 Units Subcutaneous QHS  . insulin aspart  0-9 Units Subcutaneous TID WC  . mouth rinse  15 mL Mouth Rinse BID  . pantoprazole (PROTONIX) IV  40 mg Intravenous Q12H  . prazosin  1 mg Oral QHS  . ramelteon  8 mg Oral QHS  . simethicone  80 mg Oral BID  . tamsulosin  0.4 mg Oral Daily  . venlafaxine XR  225 mg Oral Q breakfast   Infusions: .  ceFAZolin (ANCEF) IV 2 g (05/31/20 0636)  . lactated ringers 125 mL/hr at 05-31-20 1016   PRN Meds: acetaminophen **OR** acetaminophen, HYDROmorphone (DILAUDID) injection, lip balm, ondansetron **OR** ondansetron (ZOFRAN) IV, opium-belladonna, polyethylene glycol   Allergies as of 05/02/2020  . (No Known Allergies)    Family History  Problem Relation Age of Onset  . CAD Neg Hx     Social History   Socioeconomic History  . Marital status: Married    Spouse name: Not on file  . Number of children: Not on file  . Years of education: Not on file  . Highest education level: Not on file  Occupational History  . Not on file  Tobacco Use  . Smoking status: Former Games developer  . Smokeless tobacco: Never Used  . Tobacco comment: Smoked age [age]  Substance and Sexual Activity  . Alcohol use: No  . Drug use: No  . Sexual activity: Not on file  Other Topics Concern  . Not on file  Social History Narrative  . Not on file   Social Determinants of Health   Financial Resource Strain:   . Difficulty of Paying Living Expenses:   Food Insecurity:   . Worried About Programme researcher, broadcasting/film/video in the Last Year:   . Barista in the Last Year:   Transportation Needs:   . Freight forwarder (Medical):   Marland Kitchen Lack of Transportation (Non-Medical):   Physical Activity:   . Days of Exercise per Week:   . Minutes of Exercise per Session:   Stress:   . Feeling of Stress :   Social Connections:   . Frequency of  Communication with Friends and Family:   . Frequency of Social Gatherings with Friends and Family:   . Attends Religious Services:   . Active Member of Clubs or Organizations:   . Attends Banker Meetings:   Marland Kitchen Marital Status:   Intimate Partner Violence:   . Fear of Current or Ex-Partner:   . Emotionally  Abused:   Marland Kitchen Physically Abused:   . Sexually Abused:     REVIEW OF SYSTEMS: Constitutional: Feels better than he did when he came in.  Some weakness. ENT:  No nose bleeds Pulm: No shortness of breath, no cough. CV:  No palpitations, no LE edema.  No angina. GU: Foley catheter remains in place GI: See HPI. Heme: Denies unusual or excessive bleeding or bruising. Transfusions: Denies previous blood product transfusions. Neuro:  No headaches, no peripheral tingling or numbness.  No seizures, no syncope. Derm:  No itching, no rash or sores.  Endocrine:  No sweats or chills.  No polyuria or dysuria Immunization: Not queried.    PHYSICAL EXAM: Vital signs in last 24 hours: Vitals:   05/07/2020 0739 04/25/2020 0741  BP: (!) 155/68   Pulse: (!) 106   Resp: 20   Temp: 98.8 F (37.1 C) 98.3 F (36.8 C)  SpO2: 93%    Wt Readings from Last 3 Encounters:  05/11/20 118 kg  07/13/17 100.2 kg  07/30/14 103 kg    General: Obese, comfortable, alert.  Appears stated age.  Does not look acutely ill. Head: No facial asymmetry or swelling.  No signs of head trauma. Eyes: No scleral icterus.  Conjunctiva pale.  EOMI. Ears: Not hard of hearing Nose: No congestion or discharge Mouth: Tongue midline.  Mucosa and tongue are extremely dry, dried secretions on the tongue. Neck: No JVD, no masses, no thyromegaly. Lungs: Clear bilaterally, no labored breathing.   Heart: RRR.  No MRG.  S1, S2 present Abdomen: Not tender.  Distended, mild to moderately tense.  Not tender scant tinkling/tympanic bowel sounds.  Unable to appreciate hernias, bruits, organomegaly, masses. Rectal:  Deferred Musc/Skeltl: No gross joint deformities. Extremities: No CCE. Neurologic: Alert.  Appropriate.  Moves all 4 limbs, strength not tested.  No tremors.  No gross deficits. Skin: No rash, no sores Tattoos: None observed Nodes: No cervical adenopathy Psych: Calm, cooperative, pleasant.  Asks appropriate questions.  Intake/Output from previous day: 05/19 0701 - 05/20 0700 In: -  Out: 1800 [Urine:1800] Intake/Output this shift: Total I/O In: -  Out: 425 [Urine:425]  LAB RESULTS: Recent Labs    05/10/20 0459 05/11/20 0331 05/09/2020 0629  WBC 20.6* 24.0* 27.2*  HGB 9.8* 10.5* 10.5*  HCT 30.1* 32.4* 31.9*  PLT 168 175 236   BMET Lab Results  Component Value Date   NA 138 04/27/2020   NA 139 05/11/2020   NA 138 05/10/2020   K 4.4 05/09/2020   K 3.9 05/11/2020   K 3.4 (L) 05/10/2020   CL 103 04/27/2020   CL 99 05/11/2020   CL 103 05/10/2020   CO2 22 04/24/2020   CO2 23 05/11/2020   CO2 26 05/10/2020   GLUCOSE 147 (H) 05/13/2020   GLUCOSE 159 (H) 05/11/2020   GLUCOSE 180 (H) 05/10/2020   BUN 16 04/28/2020   BUN 18 05/11/2020   BUN 20 05/10/2020   CREATININE 1.24 05/11/2020   CREATININE 1.06 05/11/2020   CREATININE 1.08 05/10/2020   CALCIUM 7.8 (L) 05/19/2020   CALCIUM 8.0 (L) 05/11/2020   CALCIUM 8.0 (L) 05/10/2020   LFT No results for input(s): PROT, ALBUMIN, AST, ALT, ALKPHOS, BILITOT, BILIDIR, IBILI in the last 72 hours. PT/INR Lab Results  Component Value Date   INR 1.02 07/30/2014   Hepatitis Panel No results for input(s): HEPBSAG, HCVAB, HEPAIGM, HEPBIGM in the last 72 hours. C-Diff No components found for: CDIFF Lipase     Component Value Date/Time  LIPASE 24 05/05/2020 0239    Drugs of Abuse  No results found for: LABOPIA, COCAINSCRNUR, LABBENZ, AMPHETMU, THCU, LABBARB   RADIOLOGY STUDIES: DG Abd 1 View  Result Date: 05/11/2020 CLINICAL DATA:  Abdominal pain with nausea and vomiting EXAM: ABDOMEN - 1 VIEW COMPARISON:  May 06, 2020  FINDINGS: Stomach is distended with air. There is no appreciable small or large bowel dilatation. No air-fluid levels. No free air. Visualized lung bases clear. 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 IMPRESSION: Diffuse gastric dilatation with air. No small or large bowel dilatation. No free air. The appearance of the stomach raises concern for a degree of gastric outlet obstruction. 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 Electronically Signed   By: Lowella Grip III M.D.   On: 05/11/2020 08:50   CT ABDOMEN PELVIS W CONTRAST  Result Date: 05/11/2020 CLINICAL DATA:  Bowel obstruction suspected, gastric outlet obstruction, worsening abdominal pain and distension EXAM: CT ABDOMEN AND PELVIS WITH CONTRAST TECHNIQUE: Multidetector CT imaging of the abdomen and pelvis was performed using the standard protocol following bolus administration of intravenous contrast. CONTRAST:  137mL OMNIPAQUE IOHEXOL 300 MG/ML SOLN, additional oral enteric contrast COMPARISON:  CT abdomen, 05/14/2020, MR abdomen, 11/30/2019 FINDINGS: Lower chest: New small to moderate bilateral pleural effusions and associated atelectasis or consolidation. Hepatobiliary: No solid liver abnormality is seen. No gallstones, gallbladder wall thickening, or biliary dilatation. Pancreas: Unremarkable. No pancreatic ductal dilatation or surrounding inflammatory changes. Spleen: Normal in size without significant abnormality. Adrenals/Urinary Tract: Benign left adrenal adenoma, previously characterized by MR. Mild bilateral hydronephrosis and hydroureter, similar to prior examination. The urinary bladder is thickened and decompressed by a Foley catheter. Stomach/Bowel: The stomach is distended by air, fluid, and ingested contrast, with somewhat unusual non dependent air loculations of the gastric fundus, likely related to  layering ingested food material. Appendix appears normal. No evidence of bowel wall thickening, distention, or inflammatory changes. Vascular/Lymphatic: Aortic atherosclerosis. There are abnormally enlarged retroperitoneal, bilateral iliac, and pelvic sidewall lymph nodes, enlarged compared to prior examination dated 05/02/2020. The largest right iliac node measures 2.5 x 1.5 cm (series 2, image 64). Reproductive: Gross prostatomegaly. Other: Anasarca. Trace ascites. Musculoskeletal: No acute or significant osseous findings. IMPRESSION: 1. No definite CT evidence of bowel or gastric outlet obstruction the stomach is distended by air, fluid, and ingested contrast, with somewhat unusual dependent air loculations of the gastric fundus, concerning for pneumatosis although possibly related to ingested material as true gastric pneumatosis is unusual. Consider short interval follow-up CT or surgical consultation as indicated by clinical concern. 2. There is oral enteric contrast passage to the small bowel, excluding high-grade gastric outlet obstruction. 3. New small to moderate bilateral pleural effusions and associated atelectasis or consolidation. 4. There are abnormally enlarged retroperitoneal, bilateral iliac, and pelvic sidewall lymph nodes, significantly enlarged compared to prior examination dated 05/08/2020 and likely reactive given short interval change. 5. Gross prostatomegaly. The urinary bladder is thickened and decompressed by a Foley catheter. Mild bilateral hydronephrosis and hydroureter, similar to prior examination likely related to chronic outlet obstruction and back pressure. 6. Small volume ascites, anasarca, and pleural effusions. 7. Aortic Atherosclerosis (ICD10-I70.0). These results will be called to the ordering clinician or representative by the Radiologist Assistant, and communication documented in the PACS or [employer]. Electronically Signed   By: Eddie Candle M.D.   On: 05/11/2020 16:59  IMPRESSION:   *    Gastric outlet obstruction, not complete as contrast does enter small bowel on yesterday's CT. Staff unable to place NG tube Surgery following as of yesterday when the rec'd NGT decompression.    *     Prostamegaly causing bladder outlet obstruction.  Associated uremia and AKI resolved.    PLAN:     *   EGD this afternoon.  Hopefully Dr. Christella Hartigan can place an NG tube after the procedure is completed.   Jennye Moccasin  05/11/2020, 10:27 AM Phone [phone]  ________________________________________________________________________  [facility] MD note:  I personally examined the patient, reviewed the data and agree with the assessment and plan described above. I am planning EGD this afternoon for his partial GOO. Will hopefully be able to leave NG in place afterwards.  Rob Bunting, MD [facility] Pager [phone]

## 2020-05-12 NOTE — Progress Notes (Deleted)
Assessment/Plan:    ***  Subjective:   Ethan Johnson was seen today in the movement disorders clinic for neurologic consultation at the request of Katrinka Blazing Marlynn Perking., FNP.  The consultation is for the evaluation of ***.   Specific Symptoms:  Tremor: {yes no:314532} Family hx of similar:  {yes no:314532} Voice: *** Sleep: ***  Vivid Dreams:  {yes no:314532}  Acting out dreams:  {yes no:314532} Wet Pillows: {yes no:314532} Postural symptoms:  {yes no:314532}  Falls?  {yes no:314532} Bradykinesia symptoms: {parkinson brady:18041} Loss of smell:  {yes no:314532} Loss of taste:  {yes no:314532} Urinary Incontinence:  {yes no:314532} Difficulty Swallowing:  {yes no:314532} Handwriting, micrographia: {yes no:314532} Trouble with ADL's:  {yes no:314532}  Trouble buttoning clothing: {yes no:314532} Depression:  {yes no:314532} Memory changes:  {yes no:314532} Hallucinations:  {yes no:314532}  visual distortions: {yes no:314532} N/V:  {yes no:314532} Lightheaded:  {yes no:314532}  Syncope: {yes no:314532} Diplopia:  {yes no:314532} Dyskinesia:  {yes no:314532} Prior exposure to reglan/antipsychotics: {yes no:314532}  Neuroimaging of the brain has *** previously been performed.  It *** available for my review today.  PREVIOUS MEDICATIONS: {Parkinson's RX:18200}  ALLERGIES:  No Known Allergies  CURRENT MEDICATIONS:  Current Outpatient Medications  Medication Instructions  . amitriptyline (ELAVIL) 10 mg, Oral, Daily at bedtime  . aspirin 81 mg, Oral, Daily  . atorvastatin (LIPITOR) 40 mg, Oral, Daily at bedtime  . ferrous sulfate 325 mg, Oral, Daily with breakfast  . folic acid (FOLVITE) 1 mg, Oral, Daily  . glipiZIDE (GLUCOTROL) 10 mg, Oral, 2 times daily before meals  . metFORMIN (GLUCOPHAGE) 1,000 mg, 2 times daily with meals  . methocarbamol (ROBAXIN) 500 mg, Oral, at bedtime and repeat x1 PRN  . prazosin (MINIPRESS) 1 mg, Oral, Daily at bedtime  . tamsulosin  (FLOMAX) 0.4 mg, Oral, Daily  . venlafaxine XR (EFFEXOR-XR) 225 mg, Oral, Daily with breakfast    Objective:   VITALS:  There were no vitals filed for this visit.  GEN:  The patient appears stated age and is in NAD. HEENT:  Normocephalic, atraumatic.  The mucous membranes are moist. The superficial temporal arteries are without ropiness or tenderness. CV:  RRR Lungs:  CTAB Neck/HEME:  There are no carotid bruits bilaterally.  Neurological examination:  Orientation: The patient is alert and oriented x3.  Cranial nerves: There is good facial symmetry. Extraocular muscles are intact. The visual fields are full to confrontational testing. The speech is fluent and clear. Soft palate rises symmetrically and there is no tongue deviation. Hearing is intact to conversational tone. Sensation: Sensation is intact to light and pinprick throughout (facial, trunk, extremities). Vibration is intact at the bilateral big toe. There is no extinction with double simultaneous stimulation. There is no sensory dermatomal level identified. Motor: Strength is 5/5 in the bilateral upper and lower extremities.   Shoulder shrug is equal and symmetric.  There is no pronator drift. Deep tendon reflexes: Deep tendon reflexes are 2/4 at the bilateral biceps, triceps, brachioradialis, patella and achilles. Plantar responses are downgoing bilaterally.  Movement examination: Tone: There is ***tone in the bilateral upper extremities.  The tone in the lower extremities is ***.  Abnormal movements: *** Coordination:  There is *** decremation with RAM's, *** Gait and Station: The patient has *** difficulty arising out of a deep-seated chair without the use of the hands. The patient's stride length is ***.  The patient has a *** pull test.     I have reviewed and interpreted  the following labs independently   Chemistry      Component Value Date/Time   NA 149 (H) 05/16/2020 0530   K 3.8 05/16/2020 0530   CL 115 (H)  05/16/2020 0530   CO2 21 (L) 05/16/2020 0530   BUN 43 (H) 05/16/2020 0530   CREATININE 2.56 (H) 05/16/2020 0530      Component Value Date/Time   CALCIUM 6.4 (LL) 05/16/2020 0530   ALKPHOS 72 05/14/2020 0951   AST 21 05/14/2020 0951   ALT 15 05/14/2020 0951   BILITOT 1.1 05/14/2020 0951      Lab Results  Component Value Date   TSH 2.236 05/16/2020   Lab Results  Component Value Date   WBC 19.2 (H) 05/16/2020   HGB 8.6 (L) 05/16/2020   HCT 27.4 (L) 05/16/2020   MCV 84.0 05/16/2020   PLT 452 (H) 05/16/2020     Total time spent on today's visit was ***60 minutes, including both face-to-face time and nonface-to-face time.  Time included that spent on review of records (prior notes available to me/labs/imaging if pertinent), discussing treatment and goals, answering patient's questions and coordinating care.  Cc:  [place]

## 2020-05-12 NOTE — Op Note (Addendum)
   Operative Note   Date: 05/16/2020  Procedure: Exploratory laparotomy, partial gastrectomy, gastrojejunostomy tube placement  Pre-op diagnosis: Necrotic stomach Post-op diagnosis: Same  Indication and clinical history: The patient is a 74 y.o. 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 Verbal consent was obtained from the wife preoperatively.  Surgeon: Diamantina Monks, MD Assistant: Janee Morn, MD  Anesthesia: General  Findings:  . Specimen: Proximal stomach . EBL: 25cc . Drains/Implants:  Gastrojejunostomy tube, JP drain x1  Disposition: ICU - intubated and hemodynamically stable.  Description of procedure: The patient was positioned supine on the operating room table. General anesthetic induction was uneventful. Time-out was performed verifying correct patient, procedure, signature of informed consent, and administration of pre-operative antibiotics. The patient was prepped and draped in the usual sterile fashion.  An upper midline incision was made and deepened down through the fascia. After entry into the abdomen the stomach was identified and retracted caudad, revealing frankly necrotic tissue at the superior aspect. The lesser sac was entered and after retraction of the nasogastric tube into the esophagus, the stomach was transected using a GIA stapler at the level of the incisura. The gastrohepatic and gastrosplenic ligaments were transected using a LigaSure. The short gastrics were also transected using the LigaSure to the level of the diaphragmatic hiatus. A TA 90 stapler was used to transect the stomach at the most proximal level. After stapling, the stomach was divided using curved Mayo scissors. There was a remaining cuff of stomach that was transected using a contour stapler. The staple line appeared to be intact. The nasoesophageal tube was then secured in position and labeled to ensure  no advancement or manipulation would occur. Next, two pursestring sutures were placed in the remnant stomach and a gastrojejunostomy tube inserted through the right upper quadrant abdominal wall. This was fed through gastrotomy and into the jejunum. The pursestring sutures were used to secure the GJ tube and the tube was sutured to the abdominal wall at 3 points. The abdomen was copiously irrigated and the fluid returned clear. A JP drain was placed at the staple line. The falciform ligament was used to buttress the staple line. The fascia was closed with #1 looped PDS in a running fashion. The skin was loosely closed with staples.  Sterile dressings were applied. All sponge and instrument counts were correct at the conclusion of the procedure. The patient was left intubated and transported to the ICU in stable condition. There were no complications.   The patient's wife was provided with a clinical update immediately post procedure.   Diamantina Monks, MD General and Trauma Surgery [facility]

## 2020-05-12 NOTE — Anesthesia Procedure Notes (Addendum)
Central Venous Catheter Insertion Performed by: Kaylyn Layer, MD, anesthesiologist Start/End05/10/2020 2:01 PM, 05/22/2020 2:07 PM Patient location: Pre-op. Preanesthetic checklist: patient identified, IV checked, site marked, risks and benefits discussed, surgical consent, monitors and equipment checked, pre-op evaluation, timeout performed and anesthesia consent Position: Trendelenburg Patient sedated Hand hygiene performed  and maximum sterile barriers used  Catheter size: 8 Fr Total catheter length 16. Central line was placed.Double lumen Procedure performed using ultrasound guided technique. Ultrasound Notes:anatomy identified, needle tip was noted to be adjacent to the nerve/plexus identified, no ultrasound evidence of intravascular and/or intraneural injection and image(s) printed for medical record Attempts: 1 Following insertion, dressing applied, line sutured and Biopatch. Post procedure assessment: blood return through all ports and no air  Patient tolerated the procedure well with no immediate complications. Additional procedure comments: Placed under sterile conditions intraoperatively.Marland Kitchen

## 2020-05-12 NOTE — Interval H&P Note (Signed)
History and Physical Interval Note:  05/22/2020 11:52 AM  Ethan Johnson  has presented today for surgery, with the diagnosis of GOO.  The various methods of treatment have been discussed with the patient and family. After consideration of risks, benefits and other options for treatment, the patient has consented to  Procedure(s): ESOPHAGOGASTRODUODENOSCOPY (EGD) WITH PROPOFOL (N/A) as a surgical intervention.  The patient's history has been reviewed, patient examined, no change in status, stable for surgery.  I have reviewed the patient's chart and labs.  Questions were answered to the patient's satisfaction.     Rachael Fee

## 2020-05-12 NOTE — Anesthesia Procedure Notes (Signed)
Procedure Name: Intubation Date/Time: 04/28/2020 12:20 PM Performed by: Griffin Dakin, CRNA Pre-anesthesia Checklist: Patient identified, Emergency Drugs available, Suction available and Patient being monitored Patient Re-evaluated:Patient Re-evaluated prior to induction Oxygen Delivery Method: Circle system utilized Preoxygenation: Pre-oxygenation with 100% oxygen Induction Type: IV induction, Rapid sequence and Cricoid Pressure applied Laryngoscope Size: Mac and 4 Grade View: Grade III Tube type: Oral Tube size: 7.5 mm Number of attempts: 1 Airway Equipment and Method: Stylet Placement Confirmation: ETT inserted through vocal cords under direct vision,  positive ETCO2 and breath sounds checked- equal and bilateral Secured at: 22 cm Tube secured with: Tape Dental Injury: Teeth and Oropharynx as per pre-operative assessment

## 2020-05-13 ENCOUNTER — Inpatient Hospital Stay (HOSPITAL_COMMUNITY): Payer: No Typology Code available for payment source

## 2020-05-13 LAB — POCT I-STAT, CHEM 8
BUN: 15 mg/dL (ref 8–23)
Calcium, Ion: 1.08 mmol/L — ABNORMAL LOW (ref 1.15–1.40)
Chloride: 102 mmol/L (ref 98–111)
Creatinine, Ser: 1.1 mg/dL (ref 0.61–1.24)
Glucose, Bld: 135 mg/dL — ABNORMAL HIGH (ref 70–99)
HCT: 26 % — ABNORMAL LOW (ref 39.0–52.0)
Hemoglobin: 8.8 g/dL — ABNORMAL LOW (ref 13.0–17.0)
Potassium: 3.2 mmol/L — ABNORMAL LOW (ref 3.5–5.1)
Sodium: 141 mmol/L (ref 135–145)
TCO2: 26 mmol/L (ref 22–32)

## 2020-05-13 LAB — BLOOD GAS, ARTERIAL
Acid-base deficit: 0.4 mmol/L (ref 0.0–2.0)
Bicarbonate: 22.3 mmol/L (ref 20.0–28.0)
Drawn by: 535271
FIO2: 40
O2 Saturation: 98 %
Patient temperature: 38.3
pCO2 arterial: 30.2 mmHg — ABNORMAL LOW (ref 32.0–48.0)
pH, Arterial: 7.488 — ABNORMAL HIGH (ref 7.350–7.450)
pO2, Arterial: 103 mmHg (ref 83.0–108.0)

## 2020-05-13 LAB — POCT I-STAT 7, (LYTES, BLD GAS, ICA,H+H)
Acid-base deficit: 1 mmol/L (ref 0.0–2.0)
Bicarbonate: 28.5 mmol/L — ABNORMAL HIGH (ref 20.0–28.0)
Calcium, Ion: 1.06 mmol/L — ABNORMAL LOW (ref 1.15–1.40)
HCT: 34 % — ABNORMAL LOW (ref 39.0–52.0)
Hemoglobin: 11.6 g/dL — ABNORMAL LOW (ref 13.0–17.0)
O2 Saturation: 84 %
Patient temperature: 97.7
Potassium: 3.7 mmol/L (ref 3.5–5.1)
Sodium: 140 mmol/L (ref 135–145)
TCO2: 31 mmol/L (ref 22–32)
pCO2 arterial: 73.3 mmHg (ref 32.0–48.0)
pH, Arterial: 7.196 — CL (ref 7.350–7.450)
pO2, Arterial: 60 mmHg — ABNORMAL LOW (ref 83.0–108.0)

## 2020-05-13 LAB — CBC
HCT: 30.2 % — ABNORMAL LOW (ref 39.0–52.0)
Hemoglobin: 9.6 g/dL — ABNORMAL LOW (ref 13.0–17.0)
MCH: 26.4 pg (ref 26.0–34.0)
MCHC: 31.8 g/dL (ref 30.0–36.0)
MCV: 83.2 fL (ref 80.0–100.0)
Platelets: 290 10*3/uL (ref 150–400)
RBC: 3.63 MIL/uL — ABNORMAL LOW (ref 4.22–5.81)
RDW: 15.3 % (ref 11.5–15.5)
WBC: 28.8 10*3/uL — ABNORMAL HIGH (ref 4.0–10.5)
nRBC: 0.1 % (ref 0.0–0.2)

## 2020-05-13 LAB — MAGNESIUM
Magnesium: 1.7 mg/dL (ref 1.7–2.4)
Magnesium: 1.5 mg/dL — ABNORMAL LOW (ref 1.7–2.4)

## 2020-05-13 LAB — PHOSPHORUS: Phosphorus: 3.4 mg/dL (ref 2.5–4.6)

## 2020-05-13 LAB — GLUCOSE, CAPILLARY
Glucose-Capillary: 185 mg/dL — ABNORMAL HIGH (ref 70–99)
Glucose-Capillary: 201 mg/dL — ABNORMAL HIGH (ref 70–99)
Glucose-Capillary: 207 mg/dL — ABNORMAL HIGH (ref 70–99)
Glucose-Capillary: 167 mg/dL — ABNORMAL HIGH (ref 70–99)
Glucose-Capillary: 186 mg/dL — ABNORMAL HIGH (ref 70–99)

## 2020-05-13 MED ORDER — PRAZOSIN HCL 1 MG PO CAPS
1.0000 mg | ORAL_CAPSULE | Freq: Every day | ORAL | Status: DC
  Administered 2020-05-13: 1 mg via JEJUNOSTOMY
  Filled 2020-05-13: qty 1

## 2020-05-13 MED ORDER — SODIUM CHLORIDE 0.9 % IV BOLUS
1000.0000 mL | Freq: Once | INTRAVENOUS | Status: AC
  Administered 2020-05-13: 1000 mL via INTRAVENOUS

## 2020-05-13 MED ORDER — LIDOCAINE 5 % EX PTCH
1.0000 | MEDICATED_PATCH | CUTANEOUS | Status: DC
  Administered 2020-05-13 – 2020-05-16 (×4): 1 via TRANSDERMAL
  Filled 2020-05-13 (×4): qty 1

## 2020-05-13 MED ORDER — RAMELTEON 8 MG PO TABS: 8.0000 mg | ORAL_TABLET | Freq: Every day | ORAL | Status: DC

## 2020-05-13 MED ORDER — VENLAFAXINE HCL ER 75 MG PO CP24
225.0000 mg | ORAL_CAPSULE | Freq: Every day | ORAL | Status: DC
  Filled 2020-05-13: qty 1

## 2020-05-13 MED ORDER — CHLORHEXIDINE GLUCONATE CLOTH 2 % EX PADS
6.0000 | MEDICATED_PAD | Freq: Every day | CUTANEOUS | Status: DC
  Administered 2020-05-14 – 2020-05-20 (×6): 6 via TOPICAL

## 2020-05-13 MED ORDER — ASPIRIN 81 MG PO CHEW: 81.0000 mg | CHEWABLE_TABLET | Freq: Every day | ORAL | Status: DC

## 2020-05-13 MED ORDER — INSULIN ASPART 100 UNIT/ML ~~LOC~~ SOLN
0.0000 [IU] | SUBCUTANEOUS | Status: DC
  Administered 2020-05-13 – 2020-05-14 (×6): 2 [IU] via SUBCUTANEOUS
  Administered 2020-05-14: 3 [IU] via SUBCUTANEOUS
  Administered 2020-05-14: 2 [IU] via SUBCUTANEOUS
  Administered 2020-05-15 (×2): 1 [IU] via SUBCUTANEOUS
  Administered 2020-05-15: 2 [IU] via SUBCUTANEOUS
  Administered 2020-05-16 (×5): 3 [IU] via SUBCUTANEOUS
  Administered 2020-05-16: 2 [IU] via SUBCUTANEOUS
  Administered 2020-05-17: 5 [IU] via SUBCUTANEOUS
  Administered 2020-05-17 – 2020-05-18 (×7): 3 [IU] via SUBCUTANEOUS
  Filled 2020-05-13: qty 10

## 2020-05-13 MED ORDER — FOLIC ACID 1 MG PO TABS: 1.0000 mg | ORAL_TABLET | Freq: Every day | ORAL | Status: DC

## 2020-05-13 MED ORDER — ACETAMINOPHEN 650 MG RE SUPP: 650.0000 mg | Freq: Four times a day (QID) | RECTAL | Status: DC | PRN

## 2020-05-13 MED ORDER — ATORVASTATIN CALCIUM 40 MG PO TABS
40.0000 mg | ORAL_TABLET | Freq: Every day | ORAL | Status: DC
  Administered 2020-05-13: 40 mg via JEJUNOSTOMY
  Filled 2020-05-13: qty 1

## 2020-05-13 MED ORDER — ACETAMINOPHEN 325 MG PO TABS: 650.0000 mg | ORAL_TABLET | Freq: Four times a day (QID) | ORAL | Status: DC | PRN

## 2020-05-13 MED ORDER — OXYCODONE HCL 5 MG/5ML PO SOLN
5.0000 mg | ORAL | Status: DC | PRN
  Administered 2020-05-13 (×2): 10 mg
  Filled 2020-05-13 (×2): qty 10

## 2020-05-13 MED ORDER — PIVOT 1.5 CAL PO LIQD
1000.0000 mL | ORAL | Status: DC
  Administered 2020-05-13: 1000 mL

## 2020-05-13 MED ORDER — ORAL CARE MOUTH RINSE
15.0000 mL | Freq: Two times a day (BID) | OROMUCOSAL | Status: DC
  Administered 2020-05-13 – 2020-05-15 (×5): 15 mL via OROMUCOSAL

## 2020-05-13 MED ORDER — FUROSEMIDE 10 MG/ML IJ SOLN
40.0000 mg | Freq: Once | INTRAMUSCULAR | Status: AC
  Administered 2020-05-13: 40 mg via INTRAVENOUS
  Filled 2020-05-13: qty 4

## 2020-05-13 MED ORDER — METHOCARBAMOL 1000 MG/10ML IJ SOLN
500.0000 mg | Freq: Four times a day (QID) | INTRAVENOUS | Status: DC
  Administered 2020-05-13 – 2020-05-18 (×20): 500 mg via INTRAVENOUS
  Filled 2020-05-13 (×9): qty 5
  Filled 2020-05-13: qty 500
  Filled 2020-05-13 (×14): qty 5

## 2020-05-13 MED ORDER — CHLORHEXIDINE GLUCONATE 0.12 % MT SOLN
15.0000 mL | Freq: Two times a day (BID) | OROMUCOSAL | Status: DC
  Administered 2020-05-13 – 2020-05-14 (×3): 15 mL via OROMUCOSAL

## 2020-05-13 MED ORDER — FERROUS SULFATE 300 (60 FE) MG/5ML PO SYRP: 300.0000 mg | ORAL_SOLUTION | Freq: Every day | ORAL | Status: DC

## 2020-05-13 MED ORDER — SIMETHICONE 80 MG PO CHEW
80.0000 mg | CHEWABLE_TABLET | Freq: Two times a day (BID) | ORAL | Status: DC
  Administered 2020-05-13 (×2): 80 mg via JEJUNOSTOMY
  Filled 2020-05-13: qty 1

## 2020-05-13 MED ORDER — POLYETHYLENE GLYCOL 3350 17 G PO PACK: 17.0000 g | PACK | Freq: Every day | ORAL | Status: DC | PRN

## 2020-05-13 MED ORDER — ACETAMINOPHEN 10 MG/ML IV SOLN
1000.0000 mg | Freq: Four times a day (QID) | INTRAVENOUS | Status: AC
  Administered 2020-05-13 – 2020-05-14 (×4): 1000 mg via INTRAVENOUS
  Filled 2020-05-13 (×4): qty 100

## 2020-05-13 MED ORDER — FERROUS SULFATE 325 (65 FE) MG PO TABS: 325.0000 mg | ORAL_TABLET | Freq: Every day | ORAL | Status: DC

## 2020-05-13 MED ORDER — AMITRIPTYLINE HCL 10 MG PO TABS
10.0000 mg | ORAL_TABLET | Freq: Every day | ORAL | Status: DC
  Administered 2020-05-13: 10 mg via JEJUNOSTOMY
  Filled 2020-05-13: qty 1

## 2020-05-13 NOTE — Progress Notes (Addendum)
[facility] Progress Note  1 Day Post-Op  Subjective: CC:  Arouses to voice, denies pain, following commands FiO2 40%, PEEP 5, per RN tachypneic with weaning trials overnight and this AM.  TMAX 101.2, HR 116, BP 107/55 AM labs pending Objective: Vital signs in last 24 hours: Temp:  [97.7 F (36.5 C)-102.1 F (38.9 C)] 101.2 F (38.4 C) (05/21 0500) Pulse Rate:  [91-118] 116 (05/21 0740) Resp:  [15-28] 25 (05/21 0740) BP: (103-180)/(47-111) 122/56 (05/21 0700) SpO2:  [89 %-100 %] 99 % (05/21 0750) Arterial Line BP: (123-234)/(53-64) 123/63 (05/21 0700) FiO2 (%):  [40 %-100 %] 40 % (05/21 0750) Weight:  [324 kg] 118 kg (05/20 1154) Last BM Date: 05/11/20  Intake/Output from previous day: 05/20 0701 - 05/21 0700 In: 3414.7 [I.V.:2714.7; IV Piggyback:700] Out: 3910 [Urine:2450; Drains:410; Blood:550] Intake/Output this shift: No intake/output data recorded.  PE: Gen:  Intubated male who appears stated age Card:  Regular rhythm, tachycardic Pulm:  Ventilated respirations, CTAB without crackles  Abd: Soft, distended, appropriately tender, +BS, midline honeycomb dressing c/d/i  GJ tube - G port to gravity with 300cc bilious fluid in gravity bag, J port clamped   Naso-esophageal tube to LIWS with minimal fluid in cannister  JP - 90 cc SS drainage GU: indwelling foley, >2,400 cc urine/24h, clear/yellow  Skin: warm and dry, no rashes  Psych: A&Ox3   Lab Results:  Recent Labs    05/11/20 0331 05/11/20 0331 05/18/2020 0629 05/08/2020 1757  WBC 24.0*  --  27.2*  --   HGB 10.5*   < > 10.5* 11.6*  HCT 32.4*   < > 31.9* 34.0*  PLT 175  --  236  --    < > = values in this interval not displayed.   BMET Recent Labs    05/11/20 0331 05/11/20 0331 05/09/2020 0629 04/29/2020 1757  NA 139   < > 138 140  K 3.9   < > 4.4 3.7  CL 99  --  103  --   CO2 23  --  22  --   GLUCOSE 159*  --  147*  --   BUN 18  --  16  --   CREATININE 1.06  --  1.24  --   CALCIUM 8.0*  --   7.8*  --    < > = values in this interval not displayed.   PT/INR No results for input(s): LABPROT, INR in the last 72 hours. CMP     Component Value Date/Time   NA 140 05/02/2020 1757   K 3.7 04/25/2020 1757   CL 103 05/04/2020 0629   CO2 22 05/20/2020 0629   GLUCOSE 147 (H) 05/04/2020 0629   BUN 16 05/14/2020 0629   CREATININE 1.24 04/29/2020 0629   CALCIUM 7.8 (L) 04/28/2020 0629   PROT 7.2 05/05/2020 0239   ALBUMIN 2.9 (L) 05/13/2020 1202   AST 42 (H) 05/10/2020 0239   ALT 24 04/23/2020 0239   ALKPHOS 102 05/13/2020 0239   BILITOT 1.3 (H) 05/14/2020 0239   GFRNONAA 57 (L) 05/09/2020 0629   GFRAA >60 05/01/2020 0629   Lipase     Component Value Date/Time   LIPASE 24 05/18/2020 0239       Studies/Results: DG Abd 1 View  Result Date: 05/11/2020 CLINICAL DATA:  Abdominal pain with nausea and vomiting EXAM: ABDOMEN - 1 VIEW COMPARISON:  May 06, 2020 FINDINGS: Stomach is distended with air. There is no appreciable small or large bowel dilatation. No air-fluid levels.  No free air. Visualized lung bases clear. 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 IMPRESSION: Diffuse gastric dilatation with air. No small or large bowel dilatation. No free air. The appearance of the stomach raises concern for a degree of gastric outlet obstruction. 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 Electronically Signed   By: Bretta Bang III M.D.   On: 05/11/2020 08:50   CT ABDOMEN PELVIS W CONTRAST  Result Date: 05/11/2020 CLINICAL DATA:  Bowel obstruction suspected, gastric outlet obstruction, worsening abdominal pain and distension EXAM: CT ABDOMEN AND PELVIS WITH CONTRAST TECHNIQUE: Multidetector CT imaging of the abdomen and pelvis was performed using the standard protocol following bolus administration of intravenous contrast. CONTRAST:  OMNIPAQUE IOHEXOL  300 MG/ML SOLN, additional oral enteric contrast COMPARISON:  CT abdomen, 21-May-2020, MR abdomen, 11/30/2019 FINDINGS: Lower chest: New small to moderate bilateral pleural effusions and associated atelectasis or consolidation. Hepatobiliary: No solid liver abnormality is seen. No gallstones, gallbladder wall thickening, or biliary dilatation. Pancreas: Unremarkable. No pancreatic ductal dilatation or surrounding inflammatory changes. Spleen: Normal in size without significant abnormality. Adrenals/Urinary Tract: Benign left adrenal adenoma, previously characterized by MR. Mild bilateral hydronephrosis and hydroureter, similar to prior examination. The urinary bladder is thickened and decompressed by a Foley catheter. Stomach/Bowel: The stomach is distended by air, fluid, and ingested contrast, with somewhat unusual non dependent air loculations of the gastric fundus, likely related to layering ingested food material. Appendix appears normal. No evidence of bowel wall thickening, distention, or inflammatory changes. Vascular/Lymphatic: Aortic atherosclerosis. There are abnormally enlarged retroperitoneal, bilateral iliac, and pelvic sidewall lymph nodes, enlarged compared to prior examination dated 05/21/20. The largest right iliac node measures 2.5 x 1.5 cm (series 2, image 64). Reproductive: Gross prostatomegaly. Other: Anasarca. Trace ascites. Musculoskeletal: No acute or significant osseous findings. IMPRESSION: 1. No definite CT evidence of bowel or gastric outlet obstruction the stomach is distended by air, fluid, and ingested contrast, with somewhat unusual dependent air loculations of the gastric fundus, concerning for pneumatosis although possibly related to ingested material as true gastric pneumatosis is unusual. Consider short interval follow-up CT or surgical consultation as indicated by clinical concern. 2. There is oral enteric contrast passage to the small bowel, excluding high-grade gastric outlet  obstruction. 3. New small to moderate bilateral pleural effusions and associated atelectasis or consolidation. 4. There are abnormally enlarged retroperitoneal, bilateral iliac, and pelvic sidewall lymph nodes, significantly enlarged compared to prior examination dated 05-21-2020 and likely reactive given short interval change. 5. Gross prostatomegaly. The urinary bladder is thickened and decompressed by a Foley catheter. Mild bilateral hydronephrosis and hydroureter, similar to prior examination likely related to chronic outlet obstruction and back pressure. 6. Small volume ascites, anasarca, and pleural effusions. 7. Aortic Atherosclerosis (ICD10-I70.0). These results will be called to the ordering clinician or representative by the Radiologist Assistant, and communication documented in the PACS or [employer]. Electronically Signed   By: Lauralyn Primes M.D.   On: 05/11/2020 16:59    Anti-infectives: Anti-infectives (From admission, onward)   Start     Dose/Rate Route Frequency Ordered Stop   05/10/20 0600  ceFAZolin (ANCEF) IVPB 2g/100 mL premix     2 g 200 mL/hr over 30 Minutes Intravenous Every 8 hours 05/09/20 1542     05/07/20 1000  cefTRIAXone (ROCEPHIN) 2 g in sodium chloride 0.9 % 100 mL IVPB  Status:  Discontinued  2 g 200 mL/hr over 30 Minutes Intravenous Every 24 hours 05/09/2020 1036 05/09/20 1542   05/09/2020 0745  cefTRIAXone (ROCEPHIN) 2 g in sodium chloride 0.9 % 100 mL IVPB     2 g 200 mL/hr over 30 Minutes Intravenous  Once 05/10/2020 0730 05/07/2020 7494     Assessment/Plan DM2  Adrenal incidentaloma Urinary retention enlarged prostate Klebsiella bacteremia  Acute hypoxic respiratory failure Gastric necrosis, gastric volvulus   POD#1 s/p partial gastrectomy, gastrojejunostomy tube placement  - patient with nasoesphageal tube, continue to LIWS; will discuss and esophageal endoscopy findings with GI today.  - may start trickle TF via J-port, Continue G-port to gravity,  meds per G-tube ok. - increase multimodal pain control - added scheduled IV tylenol 1,000 mg q6h, IV robaxin 500 mg q6h, Lidoderm patch to abdomen  - continue to monitor JP amount/color  - follow up AM labs  - follow surgical path  - Per Dr.Lovick, if patient stable, plan for repeat upper endoscopy Monday, follow by possible esophagojejunostomy (EJ) by Dr. Hassell Done later in the week.  FEN: NPO, IVF ID: [id number]  VTE: SCD's, SQH Foley: in place 2/2 prostatism with urosepsis   Appreciate CCM assistance in the management of this patient.   LOS: 7 days    Obie Dredge, [facility] Please see Amion for pager number during day hours 7:00am-4:30pm

## 2020-05-13 NOTE — Progress Notes (Signed)
Initial Nutrition Assessment  DOCUMENTATION CODES:   Obesity unspecified  INTERVENTION:   Initiate tube feeding via J-tube: Pivot 1.5 at 20 ml/h (480 ml per day)  Provides 720 kcal, 45 gm protein, 364 ml free water daily   As able advance TF to goal rate: Pivot 1.5 @ 65 ml/hr   Provides: 2340 kcal, 146 grams protein, and 1184 ml free water.   NUTRITION DIAGNOSIS:   Increased nutrient needs related to post-op healing as evidenced by estimated needs.  GOAL:   Patient will meet greater than or equal to 90% of their needs  MONITOR:   TF tolerance  REASON FOR ASSESSMENT:   Consult Enteral/tube feeding initiation and management  ASSESSMENT:   Pt with PMH of DM, aortic atherosclerosis, BPH, hepatic steatosis, neuropathy, and adrenal incidentaloma admitted with abd pain x 1 week PTA, bladder outlet obstruction, sepsis, uremia/AKI from massively dilated prostate.   Pt discussed during ICU rounds and with RN.  Per surgery will need repeat upper endoscopy followed by possible esophagojejunostomy  5/20 s/p partial gastrectomy, gastrojejunostomy tube placement with nasoesophageal tube, g-tube to LIWS, and J-tube.    Medications reviewed and include: ferrous sulfate, folic acid, SSI Labs reviewed:  CBG's: [phone]   NUTRITION - FOCUSED PHYSICAL EXAM:    Most Recent Value  Orbital Region  No depletion  Upper Arm Region  No depletion  Thoracic and Lumbar Region  No depletion  Buccal Region  No depletion  Temple Region  No depletion  Clavicle Bone Region  No depletion  Clavicle and Acromion Bone Region  No depletion  Scapular Bone Region  No depletion  Dorsal Hand  No depletion  Patellar Region  No depletion  Anterior Thigh Region  No depletion  Posterior Calf Region  No depletion  Edema (RD Assessment)  Moderate  Hair  Reviewed  Eyes  Reviewed  Mouth  Reviewed  Skin  Reviewed  Nails  Reviewed       Diet Order:   Diet Order            Diet NPO time  specified  Diet effective now              EDUCATION NEEDS:   No education needs have been identified at this time  Skin:  Skin Assessment: Skin Integrity Issues: Skin Integrity Issues:: Incisions  Last BM:  5/19  Height:   Ht Readings from Last 1 Encounters:  05/01/2020 5\' 10"  (1.778 m)    Weight:   Wt Readings from Last 1 Encounters:  05/02/2020 118 kg    Ideal Body Weight:  75.4 kg  BMI:  Body mass index is 37.33 kg/m.  Estimated Nutritional Needs:   Kcal:  2300-2500  Protein:  115-135 grams  Fluid:  >2 L/day  05/14/20., RD, LDN, CNSC See AMiON for contact information '

## 2020-05-13 NOTE — Progress Notes (Signed)
NAME:  Ethan Johnson, MRN:  [id number], DOB:  [birth year], LOS: 7 ADMISSION DATE:  05/02/2020, CONSULTATION DATE:  05/04/2020 REFERRING MD:  Janee Morn - Trauma, CHIEF COMPLAINT:  Gastric infarction.   HPI/course in hospital  74 year old man who originally presented 6 days ago with abdominal pain and was found to have urinary retention in the context of worsening symptoms of prostatism and an elevated PSA. Symptoms improved following insertion of Foley and drainage of 1.5L of urine.  SVT at that time which was felt to be sinus tachycardia as no conversion with adenosine. Hypotension and tachycardia improved initially with relief of obstruction.   Found to have Klebsiella bacteremia which was treated with ceftriaxone. Transitioned to orals and was to be discharged with Foley in place for outpatient urology evaluation.  Noted to have gastric distension either due to outlet obstruction or gastroparesis, exacerbated by acute illness.   5/19 developed sudden abdominal pain with tachypnea and desaturation. Loose dark stools.   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   5/20 OR  -  Subtotal gastrectomy with placement of jejunostomy tube. Normotensive throughout the case, minimal blood loss, fluids given.  Returned to ICU intubated.  Past Medical History   Past Medical History:  Diagnosis Date  . Adrenal incidentaloma (HCC)   . Aortic atherosclerosis (HCC)   . BPH (benign prostatic hyperplasia)   . Diabetes mellitus without complication (HCC)   . Hepatic steatosis   . Neuropathy     History reviewed. No pertinent surgical history.   Interim history/subjective:  In ICU intubated and sedated.   Objective   Blood pressure (!) 116/51, pulse (!) 105, temperature (!) 100.9 F (38.3 C), temperature source Axillary, resp. rate (!) 22, height 5\' 10"  (1.778 m), weight 118 kg, SpO2 100 %.    Vent Mode: PRVC FiO2 (%):  [40 %-100 %] 40 % Set Rate:  [15 bmp-20 bmp] 20 bmp Vt  Set:  [580 mL] 580 mL PEEP:  [5 cmH20] 5 cmH20 Pressure Support:  [10 cmH20-15 cmH20] 15 cmH20 Plateau Pressure:  [16 cmH20-21 cmH20] 16 cmH20   Intake/Output Summary (Last 24 hours) at 05/13/2020 1022 Last data filed at 05/13/2020 1000 Gross per 24 hour  Intake 3914.57 ml  Output 3485 ml  Net 429.57 ml   Filed Weights   04/27/2020 0229 05/11/20 0409 05/09/2020 1154  Weight: 100.2 kg 118 kg 118 kg    Examination: Physical Exam Constitutional:      Appearance: He is obese.     Interventions: He is sedated and intubated.  HENT:     Head:     Comments: NGT tube in place with minimal bloody drainage.      Mouth/Throat:     Mouth: Mucous membranes are moist.     Comments: ETT tube in place Eyes:     General: No scleral icterus. Cardiovascular:     Rate and Rhythm: Normal rate and regular rhythm.     Heart sounds: Normal heart sounds.  Pulmonary:     Effort: He is intubated.     Breath sounds: Normal breath sounds and air entry.  Abdominal:     General: A surgical scar is present. Bowel sounds are decreased.     Tenderness: There is no abdominal tenderness.       Comments: Clean mid line incision. Jejunostomy RUQ. LUQ JP with minimal bloody drainage.  Genitourinary:    Comments: Foley in place. Neurological:     Mental  Status: He is unresponsive.     Comments: Currently sedated.       Ancillary tests (personally reviewed)  CBC: Recent Labs  Lab 05/09/20 0647 05/09/20 0647 05/10/20 0459 05/11/20 0331 05/21/2020 0629 05/08/2020 1757 05/13/20 0820  WBC 24.5*  --  20.6* 24.0* 27.2*  --  28.8*  HGB 9.4*   < > 9.8* 10.5* 10.5* 11.6* 9.6*  HCT 28.7*   < > 30.1* 32.4* 31.9* 34.0* 30.2*  MCV 82.7  --  83.4 83.9 83.7  --  83.2  PLT 153  --  168 175 236  --  290   < > = values in this interval not displayed.    Basic Metabolic Panel: Recent Labs  Lab 05/16/2020 1202 05/07/20 2774 05/08/20 0552 05/08/20 0552 05/09/20 0647 05/10/20 0459 05/11/20 0331 04/23/2020 0629  05/11/2020 1757 05/13/20 0820  NA 137   < > 138   < > 138 138 139 138 140  --   K 4.2   < > 3.6   < > 3.6 3.4* 3.9 4.4 3.7  --   CL 103   < > 105  --  104 103 99 103  --   --   CO2 16*   < > 23  --  23 26 23 22   --   --   GLUCOSE 134*   < > 180*  --  198* 180* 159* 147*  --   --   BUN 29*   < > 35*  --  25* 20 18 16   --   --   CREATININE 3.47*   < > 1.59*  --  1.25* 1.08 1.06 1.24  --   --   CALCIUM 8.1*   < > 8.4*  --  8.2* 8.0* 8.0* 7.8*  --   --   MG  --   --   --   --   --   --   --   --   --  1.7  PHOS 5.1*  --   --   --   --   --   --   --   --   --    < > = values in this interval not displayed.   GFR: Estimated Creatinine Clearance: 67.3 mL/min (by C-G formula based on SCr of 1.24 mg/dL). Recent Labs  Lab 05/04/2020 1202 05/18/2020 1600 05/07/20 0642 05/07/20 1422 05/07/20 1552 05/08/20 0552 05/08/20 1553 05/09/20 1287 05/10/20 0459 05/11/20 0331 05/11/20 1846 05/11/20 2122 04/29/2020 0629 05/13/20 0820  PROCALCITON >150.00  --  145.79  --   --  71.68  --   --   --   --   --   --   --   --   WBC  --   --  24.0*   < >  --  23.7*  --    < > 20.6* 24.0*  --   --  27.2* 28.8*  LATICACIDVEN  --    < > 2.4*   < > 2.1*  --  1.5  --   --   --  1.4 1.4  --   --    < > = values in this interval not displayed.    Liver Function Tests: Recent Labs  Lab 05/11/2020 1202  ALBUMIN 2.9*   No results for input(s): LIPASE, AMYLASE in the last 168 hours. No results for input(s): AMMONIA in the last 168 hours.  ABG    Component Value Date/Time   PHART  7.488 (H) 05/13/2020 0825   PCO2ART 30.2 (L) 05/13/2020 0825   PO2ART 103 05/13/2020 0825   HCO3 22.3 05/13/2020 0825   TCO2 31 05/08/2020 1757   ACIDBASEDEF 0.4 05/13/2020 0825   O2SAT 98.0 05/13/2020 0825     Coagulation Profile: No results for input(s): INR, PROTIME in the last 168 hours.  Cardiac Enzymes: No results for input(s): CKTOTAL, CKMB, CKMBINDEX, TROPONINI in the last 168 hours.  HbA1C: Hgb A1c MFr Bld    Date/Time Value Ref Range Status  05/11/2020 09:44 AM 7.6 (H) 4.8 - 5.6 % Final    Comment:    (NOTE) Pre diabetes:          5.7%-6.4% Diabetes:              >6.4% Glycemic control for   <7.0% adults with diabetes     CBG: Recent Labs  Lab 04/25/2020 0809 05/22/2020 1220 05/08/2020 1707 04/23/2020 2212 05/13/20 0714  GLUCAP 175* 137* 113* 164* 185*    Assessment & Plan:   Critically ill due to expected acute hypoxic respiratory failure following partial gastrectomy. No hemodynamic instability or intraoperative fluid shifts. - Analgesia for upper abdominal incision.  - Pause sedation and attempt SBT and extubation.  - At increased risk of post-operative pulmonary complications given body habitus and location of incision.   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  - No manipulation of NGT -Start feeding via J-tube tomorrow.  Urinary retention due to BPH - Leave Foley in place.  Daily Goals Checklist  Pain/Anxiety/Delirium protocol (if indicated): fentanyl prn, stop propofol VAP protocol (if indicated): bundle in place Respiratory support goals: extubation today. Blood pressure target: MAP<90 DVT prophylaxis: Heparin TID Nutritional status and feeding goals: moderate risk, start tube feeds tomorrow. GI prophylaxis: Not required Fluid status goals: Diuresis today to facilitate extubation.  Urinary catheter: urinary retention. Central lines: Arterial line, R IJ CVC Glucose control: well controlled on SSI. T2DM with HbA1C 7.6 Mobility/therapy needs: progressive ambulation following extubation.  Antibiotic de-escalation: perioperative antibiotics only. Home medication reconciliation: on hold Daily labs: CBC, BMET Code Status: full  Family Communication: updated at bedside today. Disposition: ICU  CRITICAL CARE Performed by: Lynnell Catalan   Total critical care time: 35 minutes  Critical care time was  exclusive of separately billable procedures and treating other patients.  Critical care was necessary to treat or prevent imminent or life-threatening deterioration.  Critical care was time spent personally by me on the following activities: development of treatment plan with patient and/or surrogate as well as nursing, discussions with consultants, evaluation of patient's response to treatment, examination of patient, obtaining history from patient or surrogate, ordering and performing treatments and interventions, ordering and review of laboratory studies, ordering and review of radiographic studies, pulse oximetry, re-evaluation of patient's condition and participation in multidisciplinary rounds.  Lynnell Catalan, MD [facility] ICU Physician [facility]  Pager: [phone] Mobile: [phone] After hours: [phone].    05/13/2020, 10:22 AM

## 2020-05-13 NOTE — Procedures (Signed)
Extubation Procedure Note  Patient Details:   Name: Ethan Johnson DOB: 07-17-1946 MRN: [id number]   Airway Documentation:    Vent end date: 05/13/20 Vent end time: 1124   Evaluation  O2 sats: stable throughout Complications: No apparent complications Patient did tolerate procedure well. Bilateral Breath Sounds: Diminished   Yes   RT extubated patient to 4L [place] per MD order with RN at bedside. Positive cuff leak noted. Patient tolerated well and no stridor noted. Patient currently sating 100% on 4L [place]. RT will continue to monitor as needed.   Lura Em 05/13/2020, 11:31 AM

## 2020-05-13 NOTE — Progress Notes (Signed)
I spoke with Dr. Bedelia Person, will plan to see him Sunday and if he is doing well will arrange EGD on Monday to assess viability of distal esophagus, GE junction to aid in planning the next surgical step which is likely to be later next week.

## 2020-05-13 NOTE — Progress Notes (Signed)
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 PMH includes DM and HTN. Pt underwent upper GI endoscopy on 5/20 revealing necrotic proximal 1/3 of stomach. Pt underwent Subtotal gastrectomy with placement of jejunostomy tube on 5/20, remaining intubated after procedure.    PT Comments    PT performing re-evaluation as pt underwent subtotal gastrectomy with J tube placement and remained intubated after procedure since last PT session, goals updated according to patient functional status. Pt continues to remain limited by abdominal pain and is generally weak, likely due to pain also. Pt requires significant physical assistance to perform all mobility tasks and declines attempts at standing this session. Pt is minimally able to mobilize extremities in the bed and often requires PT encouragement to do as much for himself as possible. Pt will continue to benefit from PT POC to improve mobility tolerance and quality. PT continues to recommend SNF placement at this time.   Follow Up Recommendations  SNF;Supervision/Assistance - 24 hour     Equipment Recommendations  Wheelchair (measurements PT);Wheelchair cushion (measurements PT);Hospital bed(mechanical lift, all if home today)    Recommendations for Other Services       Precautions / Restrictions Precautions Precautions: Fall Precaution Comments: J-tube, 2 drains, NG tube Restrictions Weight Bearing Restrictions: No    Mobility  Bed Mobility Overal bed mobility: Needs Assistance Bed Mobility: Supine to Sit;Sit to Supine     Supine to sit: Max assist;HOB elevated Sit to supine: Max assist;+2 for physical assistance      Transfers                    Ambulation/Gait                 Stairs              Wheelchair Mobility    Modified Rankin (Stroke Patients Only)       Balance Overall balance assessment: Needs assistance Sitting-balance support: Single extremity supported;Feet supported Sitting balance-Leahy Scale: Poor Sitting balance - Comments: minA to maintain sitting balance at the edge of bed                                    Cognition Arousal/Alertness: Awake/alert Behavior During Therapy: WFL for tasks assessed/performed Overall Cognitive Status: Impaired/Different from baseline Area of Impairment: Problem solving                             Problem Solving: Slow processing        Exercises      General Comments General comments (skin integrity, edema, etc.): tachy into low 100s and 120s with mobility, other VSS. Pt on 2L [place]      Pertinent Vitals/Pain Pain Assessment: Faces Faces Pain Scale: Hurts worst Pain Location: abdomen Pain Descriptors / Indicators: Moaning;Grimacing;Guarding Pain Intervention(s): Monitored during session;Premedicated before session    Home Living                      Prior Function            PT Goals (current goals can now be found in the care plan section) Acute Rehab PT Goals Patient Stated  Goal: to get stronger and go to rehab PT Goal Formulation: With patient Time For Goal Achievement: 05/27/20 Potential to Achieve Goals: Good Progress towards PT goals: Not progressing toward goals - comment(limited by abdominal pain)    Frequency    Min 2X/week      PT Plan Current plan remains appropriate(goals updated)    Co-evaluation              AM-PAC PT "6 Clicks" Mobility   Outcome Measure  Help needed turning from your back to your side while in a flat bed without using bedrails?: Total Help needed moving from lying on your back to sitting on the side of a flat bed without using bedrails?: Total Help needed moving to and from a bed to a chair (including a  wheelchair)?: Total Help needed standing up from a chair using your arms (e.g., wheelchair or bedside chair)?: Total Help needed to walk in hospital room?: Total Help needed climbing 3-5 steps with a railing? : Total 6 Click Score: 6    End of Session Equipment Utilized During Treatment: Oxygen Activity Tolerance: Patient limited by pain Patient left: in bed;with call bell/phone within reach;with bed alarm set;with nursing/sitter in room Nurse Communication: Mobility status PT Visit Diagnosis: Unsteadiness on feet (R26.81);Muscle weakness (generalized) (M62.81);Other abnormalities of gait and mobility (R26.89);Difficulty in walking, not elsewhere classified (R26.2)     Time: 5462-7035 PT Time Calculation (min) (ACUTE ONLY): 31 min  Charges:                        Zenaida Niece, PT, DPT Acute Rehabilitation Pager: [phone]    Zenaida Niece 05/13/2020, 5:50 PM

## 2020-05-13 NOTE — Progress Notes (Signed)
eLink Physician-Brief Progress Note Patient Name: Ethan Johnson DOB: [birth year] MRN: [id number]   Date of Service  05/13/2020  HPI/Events of Note  Not able to use J tube d/t blockage. Currently on AC/HS Novolog SSI.   eICU Interventions  Plan: 1. Hold J tube feeds.  2. Will change AC/HS Novolog SSI to Q 4 hour sensitive Novolog SSI.     Intervention Category Major Interventions: Other:  Lenell Antu 05/13/2020, 8:40 PM

## 2020-05-13 NOTE — Progress Notes (Signed)
eLink Physician-Brief Progress Note Patient Name: Ethan Johnson DOB: 08-26-1946 MRN: [id number]   Date of Service  05/13/2020  HPI/Events of Note  Abdominal pain s/p medications give via G-tube. Abdominal film earlier today reveals gastric distention and possible gastric outlet obstruction.   eICU Interventions  Plan: Will not give medications via G-tube. Unable to use J-tube. Rounding team to address J/G tube dysfunction in AM.      Intervention Category Major Interventions: Other:  Lenell Antu 05/13/2020, 11:10 PM

## 2020-05-14 ENCOUNTER — Inpatient Hospital Stay (HOSPITAL_COMMUNITY): Payer: No Typology Code available for payment source

## 2020-05-14 LAB — CBC WITH DIFFERENTIAL/PLATELET
Abs Immature Granulocytes: 1.01 10*3/uL — ABNORMAL HIGH (ref 0.00–0.07)
Basophils Absolute: 0.1 10*3/uL (ref 0.0–0.1)
Basophils Relative: 0 %
Eosinophils Absolute: 0.2 10*3/uL (ref 0.0–0.5)
Eosinophils Relative: 1 %
HCT: 29.9 % — ABNORMAL LOW (ref 39.0–52.0)
Hemoglobin: 9.5 g/dL — ABNORMAL LOW (ref 13.0–17.0)
Immature Granulocytes: 3 %
Lymphocytes Relative: 21 %
Lymphs Abs: 7 10*3/uL — ABNORMAL HIGH (ref 0.7–4.0)
MCH: 27.1 pg (ref 26.0–34.0)
MCHC: 31.8 g/dL (ref 30.0–36.0)
MCV: 85.2 fL (ref 80.0–100.0)
Monocytes Absolute: 1.8 10*3/uL — ABNORMAL HIGH (ref 0.1–1.0)
Monocytes Relative: 5 %
Neutro Abs: 22.5 10*3/uL — ABNORMAL HIGH (ref 1.7–7.7)
Neutrophils Relative %: 70 %
Platelets: 341 10*3/uL (ref 150–400)
RBC: 3.51 MIL/uL — ABNORMAL LOW (ref 4.22–5.81)
RDW: 15.4 % (ref 11.5–15.5)
WBC: 32.6 10*3/uL — ABNORMAL HIGH (ref 4.0–10.5)
nRBC: 0.1 % (ref 0.0–0.2)

## 2020-05-14 LAB — COMPREHENSIVE METABOLIC PANEL
ALT: 15 U/L (ref 0–44)
AST: 21 U/L (ref 15–41)
Albumin: 1.6 g/dL — ABNORMAL LOW (ref 3.5–5.0)
Alkaline Phosphatase: 72 U/L (ref 38–126)
Anion gap: 13 (ref 5–15)
BUN: 16 mg/dL (ref 8–23)
CO2: 25 mmol/L (ref 22–32)
Calcium: 7.3 mg/dL — ABNORMAL LOW (ref 8.9–10.3)
Chloride: 109 mmol/L (ref 98–111)
Creatinine, Ser: 1.29 mg/dL — ABNORMAL HIGH (ref 0.61–1.24)
GFR calc Af Amer: >60 mL/min (ref >60)
GFR calc non Af Amer: 54 mL/min — ABNORMAL LOW (ref >60)
Glucose, Bld: 210 mg/dL — ABNORMAL HIGH (ref 70–99)
Potassium: 3.8 mmol/L (ref 3.5–5.1)
Sodium: 147 mmol/L — ABNORMAL HIGH (ref 135–145)
Total Bilirubin: 1.1 mg/dL (ref 0.3–1.2)
Total Protein: 5.3 g/dL — ABNORMAL LOW (ref 6.5–8.1)

## 2020-05-14 LAB — GLUCOSE, CAPILLARY
Glucose-Capillary: 184 mg/dL — ABNORMAL HIGH (ref 70–99)
Glucose-Capillary: 203 mg/dL — ABNORMAL HIGH (ref 70–99)
Glucose-Capillary: 187 mg/dL — ABNORMAL HIGH (ref 70–99)
Glucose-Capillary: 193 mg/dL — ABNORMAL HIGH (ref 70–99)
Glucose-Capillary: 162 mg/dL — ABNORMAL HIGH (ref 70–99)
Glucose-Capillary: 152 mg/dL — ABNORMAL HIGH (ref 70–99)

## 2020-05-14 LAB — PHOSPHORUS
Phosphorus: 2.8 mg/dL (ref 2.5–4.6)
Phosphorus: 2.6 mg/dL (ref 2.5–4.6)

## 2020-05-14 LAB — LACTIC ACID, PLASMA: Lactic Acid, Venous: 1.8 mmol/L (ref 0.5–1.9)

## 2020-05-14 LAB — MAGNESIUM
Magnesium: 1.8 mg/dL (ref 1.7–2.4)
Magnesium: 1.7 mg/dL (ref 1.7–2.4)

## 2020-05-14 MED ORDER — SODIUM CHLORIDE 0.9% FLUSH
10.0000 mL | Freq: Two times a day (BID) | INTRAVENOUS | Status: DC
  Administered 2020-05-15: 30 mL
  Administered 2020-05-16 – 2020-05-19 (×6): 10 mL
  Administered 2020-05-19: 20 mL
  Administered 2020-05-19: 10 mL
  Administered 2020-05-20: 20 mL
  Administered 2020-05-20 – 2020-05-21 (×2): 10 mL
  Administered 2020-05-21: 20 mL
  Administered 2020-05-22: 10 mL

## 2020-05-14 MED ORDER — DIPHENHYDRAMINE HCL 12.5 MG/5ML PO ELIX
12.5000 mg | ORAL_SOLUTION | Freq: Four times a day (QID) | ORAL | Status: DC | PRN
  Filled 2020-05-14: qty 5

## 2020-05-14 MED ORDER — SODIUM CHLORIDE 0.9 % IV SOLN: INTRAVENOUS | Status: DC

## 2020-05-14 MED ORDER — SODIUM CHLORIDE 0.9 % IV SOLN
INTRAVENOUS | Status: DC | PRN
  Administered 2020-05-14 – 2020-05-15 (×2): 250 mL via INTRAVENOUS

## 2020-05-14 MED ORDER — DIPHENHYDRAMINE HCL 50 MG/ML IJ SOLN: 12.5000 mg | Freq: Four times a day (QID) | INTRAMUSCULAR | Status: DC | PRN

## 2020-05-14 MED ORDER — ONDANSETRON HCL 4 MG/2ML IJ SOLN: 4.0000 mg | Freq: Four times a day (QID) | INTRAMUSCULAR | Status: DC | PRN

## 2020-05-14 MED ORDER — FUROSEMIDE 10 MG/ML IJ SOLN
40.0000 mg | Freq: Once | INTRAMUSCULAR | Status: AC
  Administered 2020-05-14: 40 mg via INTRAVENOUS
  Filled 2020-05-14: qty 4

## 2020-05-14 MED ORDER — ENOXAPARIN SODIUM 40 MG/0.4ML ~~LOC~~ SOLN
40.0000 mg | SUBCUTANEOUS | Status: DC
  Administered 2020-05-14: 40 mg via SUBCUTANEOUS
  Filled 2020-05-14: qty 0.4

## 2020-05-14 MED ORDER — HYDROMORPHONE 1 MG/ML IV SOLN
INTRAVENOUS | Status: DC
  Administered 2020-05-14: 3.6 mg via INTRAVENOUS
  Administered 2020-05-14: 0.9 mg via INTRAVENOUS
  Administered 2020-05-14: 30 mg via INTRAVENOUS
  Filled 2020-05-14: qty 30

## 2020-05-14 MED ORDER — SODIUM CHLORIDE 0.9% FLUSH: 10.0000 mL | INTRAVENOUS | Status: DC | PRN

## 2020-05-14 MED ORDER — ACETAMINOPHEN 10 MG/ML IV SOLN
1000.0000 mg | Freq: Four times a day (QID) | INTRAVENOUS | Status: AC
  Administered 2020-05-14 – 2020-05-15 (×4): 1000 mg via INTRAVENOUS
  Filled 2020-05-14 (×4): qty 100

## 2020-05-14 MED ORDER — SODIUM CHLORIDE 0.9% FLUSH: 9.0000 mL | INTRAVENOUS | Status: DC | PRN

## 2020-05-14 MED ORDER — NALOXONE HCL 0.4 MG/ML IJ SOLN: 0.4000 mg | INTRAMUSCULAR | Status: DC | PRN

## 2020-05-14 MED ORDER — HYDROMORPHONE 1 MG/ML IV SOLN
INTRAVENOUS | Status: DC
  Administered 2020-05-14: 1.2 mg via INTRAVENOUS
  Administered 2020-05-14: 30 mg via INTRAVENOUS
  Administered 2020-05-14: 4.2 mg via INTRAVENOUS
  Administered 2020-05-15: 1.8 mg via INTRAVENOUS
  Administered 2020-05-15: 2.1 mg via INTRAVENOUS

## 2020-05-14 NOTE — Progress Notes (Addendum)
[facility] Progress Note  2 Days Post-Op  Subjective: CC:  Extubated. c/o severe abdominal pain.   Vital signs in last 24 hours: Temp:  [97.8 F (36.6 C)-100.8 F (38.2 C)] 99.7 F (37.6 C) (05/22 0800) Pulse Rate:  [104-125] 110 (05/22 0800) Resp:  [18-28] 21 (05/22 0800) BP: (112-164)/(41-81) 137/58 (05/22 0800) SpO2:  [93 %-100 %] 98 % (05/22 0800) Arterial Line BP: (92-124)/(46-60) 124/60 (05/21 1200) FiO2 (%):  [40 %] 40 % (05/21 1111) Last BM Date: 05/11/20  Intake/Output from previous day: 05/21 0701 - 05/22 0700 In: 3569.5 [I.V.:1549.4; NG/GT:120; IV Piggyback:1900.1] Out: 9528 [Urine:3750; Drains:435] Intake/Output this shift: Total I/O In: -  Out: 350 [Urine:350]  PE: Gen:  [place] male who appears stated age Card:  Regular rhythm, tachycardic 111bpm Pulm:  Ventilated respirations, CTAB without crackles  Abd: Soft, distended, TTP with rebound tenderness, midline honeycomb dressing c/d/i  GJ tube - G port to gravity with bilious fluid in gravity bag   J port - connected to TF but TF not running  Naso-esophageal tube to LIWS, 120 cc/24h sanguinous  JP - 40 cc/24h, cloudy, brown drainage in bulb GU: indwelling foley clear/yellow urine Skin: warm and dry, no rashes  Psych: A&Ox3   Lab Results:  Recent Labs    May 14, 2020 0629 May 14, 2020 1407 May 14, 2020 1757 05/13/20 0820  WBC 27.2*  --   --  28.8*  HGB 10.5*   < > 11.6* 9.6*  HCT 31.9*   < > 34.0* 30.2*  PLT 236  --   --  290   < > = values in this interval not displayed.   BMET Recent Labs    May 14, 2020 0629 05-14-2020 0629 05/14/20 1407 05/14/20 1757  NA 138   < > 141 140  K 4.4   < > 3.2* 3.7  CL 103  --  102  --   CO2 22  --   --   --   GLUCOSE 147*  --  135*  --   BUN 16  --  15  --   CREATININE 1.24  --  1.10  --   CALCIUM 7.8*  --   --   --    < > = values in this interval not displayed.   PT/INR No results for input(s): LABPROT, INR in the last 72 hours. CMP      Component Value Date/Time   NA 140 May 14, 2020 1757   K 3.7 05-14-2020 1757   CL 102 May 14, 2020 1407   CO2 22 05/14/2020 0629   GLUCOSE 135 (H) 14-May-2020 1407   BUN 15 2020-05-14 1407   CREATININE 1.10 05-14-20 1407   CALCIUM 7.8 (L) 14-May-2020 0629   PROT 7.2 05/11/2020 0239   ALBUMIN 2.9 (L) 04/30/2020 1202   AST 42 (H) 05/02/2020 0239   ALT 24 05/10/2020 0239   ALKPHOS 102 05/10/2020 0239   BILITOT 1.3 (H) 05/22/2020 0239   GFRNONAA 57 (L) May 14, 2020 0629   GFRAA >60 2020/05/14 0629   Lipase     Component Value Date/Time   LIPASE 24 04/30/2020 0239       Studies/Results: No results found.  Anti-infectives: Anti-infectives (From admission, onward)   Start     Dose/Rate Route Frequency Ordered Stop   05/10/20 0600  ceFAZolin (ANCEF) IVPB 2g/100 mL premix     2 g 200 mL/hr over 30 Minutes Intravenous Every 8 hours 05/09/20 1542     05/07/20 1000  cefTRIAXone (ROCEPHIN) 2 g in sodium chloride  0.9 % 100 mL IVPB  Status:  Discontinued     2 g 200 mL/hr over 30 Minutes Intravenous Every 24 hours May 10, 2020 1036 05/09/20 1542   10-May-2020 0745  cefTRIAXone (ROCEPHIN) 2 g in sodium chloride 0.9 % 100 mL IVPB     2 g 200 mL/hr over 30 Minutes Intravenous  Once 05-10-20 0730 05-10-2020 2992     Assessment/Plan DM2  Adrenal incidentaloma Urinary retention enlarged prostate Klebsiella bacteremia  Acute hypoxic respiratory failure Gastric necrosis, gastric volvulus   POD#2 s/p partial gastrectomy, gastrojejunostomy tube placement  -  cloudy JP drainage concerning for possible leak/gastric necrosis. Get KUB to evaluate for free air. Lactate pending. - NO MEDS per G or J tube.  - IV tylenol, IV robaxin, HM PCA for pain.  - follow up AM labs  - follow surgical path  - Plan for repeat upper endoscopy Monday by Dr. Christella Hartigan to assess viability of distal esopagus, followed by possible esophagojejunostomy (EJ) by Dr. Daphine Deutscher later in the week.  FEN: NPO, IVF ID: [email or web address]  VTE:  SCD's, SQH Foley: in place 2/2 prostatism with urosepsis   Appreciate CCM assistance in the management of this patient.   LOS: 8 days    Hosie Spangle, [facility] Surgery Please see Amion for pager number during day hours 7:00am-4:30pm    Addendum:  More abd pain today, heart rate (the tachycardia is not new and is actually lower than previous)  and bp are both ok today, some bloody drainage from ng tube, murky from jp drain, not sure if he still has issues that would need more surgery will check a lactate and kub today.  I think reasonable to just stop tube feeds and g tube meds for now( for some reason had pain with this last night).  Will place on pca also.discussed plan with patient

## 2020-05-14 NOTE — Progress Notes (Signed)
NAME:  Ethan Johnson, MRN:  [id number], DOB:  12-24-1946, LOS: 8 ADMISSION DATE:  05/08/2020, CONSULTATION DATE:  05/19/20 REFERRING MD:  [place] Silos - Trauma, CHIEF COMPLAINT:  Gastric infarction.   HPI/course in hospital  74 year old man who originally presented 6 days ago with abdominal pain and was found to have urinary retention in the context of worsening symptoms of prostatism and an elevated PSA. Symptoms improved following insertion of Foley and drainage of 1.5L of urine.  SVT at that time which was felt to be sinus tachycardia as no conversion with adenosine. Hypotension and tachycardia improved initially with relief of obstruction.   Found to have Klebsiella bacteremia which was treated with ceftriaxone. Transitioned to orals and was to be discharged with Foley in place for outpatient urology evaluation.  Noted to have gastric distension either due to outlet obstruction or gastroparesis, exacerbated by acute illness.   5/19 developed sudden abdominal pain with tachypnea and desaturation. Loose dark stools.   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   5/20 OR  -  Subtotal gastrectomy with placement of jejunostomy tube. Normotensive throughout the case, minimal blood loss, fluids given.  Returned to ICU intubated.  Past Medical History   Past Medical History:  Diagnosis Date  . 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 Aortic atherosclerosis ([place])   . BPH (benign prostatic hyperplasia)   . Diabetes mellitus without complication ([place])   . Hepatic steatosis   . Neuropathy      Past Surgical History:  Procedure Laterality Date  . ESOPHAGOGASTRODUODENOSCOPY (EGD) WITH PROPOFOL N/A 05/19/2020   Procedure: ESOPHAGOGASTRODUODENOSCOPY (EGD) WITH PROPOFOL;  Surgeon: Milus Banister, MD;  Location: [facility] ENDOSCOPY;  Service: Endoscopy;  Laterality: N/A;  . LAPAROTOMY N/A 05/19/20   Procedure: SUBTOTAL GASTRECTOMY, JEJUNOSTOMY TUBE;  Surgeon: Jesusita Oka, MD;   Location: [place];  Service: General;  Laterality: N/A;     Interim history/subjective:  In ICU intubated and sedated.   Objective   Blood pressure 139/62, pulse (!) 110, temperature 99.7 F (37.6 C), temperature source Axillary, resp. rate (!) 22, height 5\' 10"  (1.778 m), weight 118 kg, SpO2 99 %.        Intake/Output Summary (Last 24 hours) at 05/14/2020 1117 Last data filed at 05/14/2020 1100 Gross per 24 hour  Intake 3312.95 ml  Output 4885 ml  Net -1572.05 ml   Filed Weights   04/26/2020 0229 05/11/20 0409 05-19-2020 1154  Weight: 100.2 kg 118 kg 118 kg    Examination: Physical Exam Constitutional:      Appearance: He is obese.     Interventions: Nasal cannula in place.  HENT:     Head:     Comments: NGT tube in place with minimal bloody drainage.      Mouth/Throat:     Mouth: Mucous membranes are moist.  Eyes:     General: No scleral icterus. Cardiovascular:     Rate and Rhythm: Normal rate and regular rhythm.     Heart sounds: Normal heart sounds.  Pulmonary:     Breath sounds: Normal breath sounds and air entry.  Abdominal:     General: A surgical scar is present. Bowel sounds are decreased.     Tenderness: There is no abdominal tenderness.       Comments: Clean mid line incision. Jejunostomy RUQ. LUQ JP with minimal cloudy drainage.  Genitourinary:    Comments: Foley in place. Neurological:     Mental Status: He  is alert and oriented to person, place, and time.     GCS: GCS eye subscore is 4. GCS verbal subscore is 5. GCS motor subscore is 6.     Cranial Nerves: Cranial nerves are intact.     Motor: Motor function is intact.      Ancillary tests (personally reviewed)  CBC: Recent Labs  Lab 05/10/20 0459 05/10/20 0459 05/11/20 0331 05/11/20 0331 04/25/2020 0629 04/29/2020 1407 04/28/2020 1757 05/13/20 0820 05/14/20 0951  WBC 20.6*  --  24.0*  --  27.2*  --   --  28.8* 32.6*  NEUTROABS  --   --   --   --   --   --   --   --  22.5*  HGB 9.8*   < >  10.5*   < > 10.5* 8.8* 11.6* 9.6* 9.5*  HCT 30.1*   < > 32.4*   < > 31.9* 26.0* 34.0* 30.2* 29.9*  MCV 83.4  --  83.9  --  83.7  --   --  83.2 85.2  PLT 168  --  175  --  236  --   --  290 341   < > = values in this interval not displayed.    Basic Metabolic Panel: Recent Labs  Lab 05/09/20 0647 05/09/20 0647 05/10/20 0459 05/10/20 0459 05/11/20 0331 05/16/2020 0629 05/04/2020 1407 05/03/2020 1757 05/13/20 0820 05/13/20 1818 05/14/20 0951  NA 138   < > 138   < > 139 138 141 140  --   --  147*  K 3.6   < > 3.4*   < > 3.9 4.4 3.2* 3.7  --   --  3.8  CL 104   < > 103  --  99 103 102  --   --   --  109  CO2 23  --  26  --  23 22  --   --   --   --  25  GLUCOSE 198*   < > 180*  --  159* 147* 135*  --   --   --  210*  BUN 25*   < > 20  --  18 16 15   --   --   --  16  CREATININE 1.25*   < > 1.08  --  1.06 1.24 1.10  --   --   --  1.29*  CALCIUM 8.2*  --  8.0*  --  8.0* 7.8*  --   --   --   --  7.3*  MG  --   --   --   --   --   --   --   --  1.7 1.5* 1.8  PHOS  --   --   --   --   --   --   --   --   --  3.4 2.8   < > = values in this interval not displayed.   GFR: Estimated Creatinine Clearance: 64.7 mL/min (A) (by C-G formula based on SCr of 1.29 mg/dL (H)). Recent Labs  Lab 05/07/20 1552 05/08/20 0552 05/08/20 1553 05/09/20 05/11/20 05/11/20 0331 05/11/20 1846 05/11/20 2122 05/11/2020 0629 05/13/20 0820 05/14/20 0951  PROCALCITON  --  71.68  --   --   --   --   --   --   --   --   WBC  --  23.7*  --    < > 24.0*  --   --  27.2*  28.8* 32.6*  LATICACIDVEN   < >  --  1.5  --   --  1.4 1.4  --   --  1.8   < > = values in this interval not displayed.    Liver Function Tests: Recent Labs  Lab 05/14/20 0951  AST 21  ALT 15  ALKPHOS 72  BILITOT 1.1  PROT 5.3*  ALBUMIN 1.6*   No results for input(s): LIPASE, AMYLASE in the last 168 hours. No results for input(s): AMMONIA in the last 168 hours.  ABG    Component Value Date/Time   PHART 7.488 (H) 05/13/2020 0825   PCO2ART  30.2 (L) 05/13/2020 0825   PO2ART 103 05/13/2020 0825   HCO3 22.3 05/13/2020 0825   TCO2 31 05/02/2020 1757   ACIDBASEDEF 0.4 05/13/2020 0825   O2SAT 98.0 05/13/2020 0825     Coagulation Profile: No results for input(s): INR, PROTIME in the last 168 hours.  Cardiac Enzymes: No results for input(s): CKTOTAL, CKMB, CKMBINDEX, TROPONINI in the last 168 hours.  HbA1C: Hgb A1c MFr Bld  Date/Time Value Ref Range Status  05/01/2020 09:44 AM 7.6 (H) 4.8 - 5.6 % Final    Comment:    (NOTE) Pre diabetes:          5.7%-6.4% Diabetes:              >6.4% Glycemic control for   <7.0% adults with diabetes     CBG: Recent Labs  Lab 05/13/20 1523 05/13/20 1934 05/13/20 2318 05/14/20 0337 05/14/20 0755  GLUCAP 207* 167* 186* 184* 203*    Assessment & Plan:   Was critically ill due to expected acute hypoxic respiratory failure following partial gastrectomy. No hemodynamic instability or intraoperative fluid shifts. - Analgesia for upper abdominal incision.  - At increased risk of post-operative pulmonary complications given body habitus and location of incision.   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 New abdominal pain with possible leakage from PEG site. - No manipulation of NGT -Hold feeds. -Investigate possible PEG dysfunction per surgery.  Urinary retention due to BPH - Leave Foley in place.  Daily Goals Checklist  Pain/Anxiety/Delirium protocol (if indicated): fentanyl prn VAP protocol (if indicated): Extubated. Respiratory support goals: Incentive spirometry.. Blood pressure target: MAP<90 DVT prophylaxis: Heparin TID Nutritional status and feeding goals: moderate risk, feeds off due to tube dysfunction.. GI prophylaxis: Not required Fluid status goals: Diuresis today given edema. Urinary catheter: urinary retention. Central lines: Arterial line, R IJ CVC Glucose control: well controlled on SSI.  T2DM with HbA1C 7.6 Mobility/therapy needs: Mobilize to chair as tolerated. Antibiotic de-escalation: perioperative antibiotics only. Home medication reconciliation: on hold Daily labs: CBC, BMET Code Status: full  Family Communication: Patient updated. Disposition: ICU   Lynnell Catalan, MD [facility] ICU Physician [facility] [place] [facility]  Pager: [phone] Mobile: [phone] After hours: [phone].    05/14/2020, 11:17 AM

## 2020-05-14 NOTE — Progress Notes (Signed)
Patient ID: Ethan Johnson, male   DOB: [birth year], 74 y.o.   MRN: [id number] More comfortable now, he is certainly at risk for ongoing necrosis/ischemia but will place g to gravity and continue jp for now.  I think overall he is improved since earlier and nothing else makes me think that I need to reexplore him right now. Discussed this with patient and his wife who is in room

## 2020-05-14 NOTE — Progress Notes (Signed)
eLink Physician-Brief Progress Note Patient Name: Ethan Johnson DOB: [birth year] MRN: [id number]   Date of Service  05/14/2020  HPI/Events of Note  Nursing request for CBC in AM.   eICU Interventions  Will order CBC with platelets in AM.      Intervention Category Major Interventions: Other:  Matteson Blue Dennard Nip 05/14/2020, 2:21 AM

## 2020-05-15 ENCOUNTER — Encounter (HOSPITAL_COMMUNITY): Payer: Self-pay | Admitting: Internal Medicine

## 2020-05-15 ENCOUNTER — Encounter (HOSPITAL_COMMUNITY): Admission: EM | Disposition: E | Payer: Self-pay | Source: Home / Self Care | Attending: Pulmonary Disease

## 2020-05-15 ENCOUNTER — Inpatient Hospital Stay (HOSPITAL_COMMUNITY): Payer: No Typology Code available for payment source

## 2020-05-15 ENCOUNTER — Inpatient Hospital Stay (HOSPITAL_COMMUNITY): Payer: No Typology Code available for payment source | Admitting: Certified Registered Nurse Anesthetist

## 2020-05-15 DIAGNOSIS — R0682 Tachypnea, not elsewhere classified: Secondary | ICD-10-CM

## 2020-05-15 DIAGNOSIS — R52 Pain, unspecified: Secondary | ICD-10-CM

## 2020-05-15 DIAGNOSIS — R109 Unspecified abdominal pain: Secondary | ICD-10-CM

## 2020-05-15 DIAGNOSIS — K2970 Gastritis, unspecified, without bleeding: Secondary | ICD-10-CM

## 2020-05-15 HISTORY — PX: APPLICATION OF WOUND VAC: SHX5189

## 2020-05-15 HISTORY — PX: VIDEO ASSISTED THORACOSCOPY (VATS)/THOROCOTOMY: SHX6173

## 2020-05-15 HISTORY — PX: LAPAROTOMY: SHX154

## 2020-05-15 LAB — CBC WITH DIFFERENTIAL/PLATELET
Abs Immature Granulocytes: 0.27 10*3/uL — ABNORMAL HIGH (ref 0.00–0.07)
Basophils Absolute: 0.1 10*3/uL (ref 0.0–0.1)
Basophils Relative: 0 %
Eosinophils Absolute: 0 10*3/uL (ref 0.0–0.5)
Eosinophils Relative: 0 %
HCT: 35 % — ABNORMAL LOW (ref 39.0–52.0)
Hemoglobin: 11.1 g/dL — ABNORMAL LOW (ref 13.0–17.0)
Immature Granulocytes: 1 %
Lymphocytes Relative: 21 %
Lymphs Abs: 4.4 10*3/uL — ABNORMAL HIGH (ref 0.7–4.0)
MCH: 26.9 pg (ref 26.0–34.0)
MCHC: 31.7 g/dL (ref 30.0–36.0)
MCV: 84.7 fL (ref 80.0–100.0)
Monocytes Absolute: 1.1 10*3/uL — ABNORMAL HIGH (ref 0.1–1.0)
Monocytes Relative: 5 %
Neutro Abs: 15.1 10*3/uL — ABNORMAL HIGH (ref 1.7–7.7)
Neutrophils Relative %: 73 %
Platelets: 400 10*3/uL (ref 150–400)
RBC: 4.13 MIL/uL — ABNORMAL LOW (ref 4.22–5.81)
RDW: 15.5 % (ref 11.5–15.5)
WBC: 20.9 10*3/uL — ABNORMAL HIGH (ref 4.0–10.5)
nRBC: 0.2 % (ref 0.0–0.2)

## 2020-05-15 LAB — POCT I-STAT 7, (LYTES, BLD GAS, ICA,H+H)
Acid-Base Excess: 5 mmol/L — ABNORMAL HIGH (ref 0.0–2.0)
Acid-Base Excess: 3 mmol/L — ABNORMAL HIGH (ref 0.0–2.0)
Acid-Base Excess: 0 mmol/L (ref 0.0–2.0)
Acid-Base Excess: 1 mmol/L (ref 0.0–2.0)
Acid-Base Excess: 1 mmol/L (ref 0.0–2.0)
Bicarbonate: 26 mmol/L (ref 20.0–28.0)
Bicarbonate: 27.4 mmol/L (ref 20.0–28.0)
Bicarbonate: 26.3 mmol/L (ref 20.0–28.0)
Bicarbonate: 26.7 mmol/L (ref 20.0–28.0)
Bicarbonate: 26.4 mmol/L (ref 20.0–28.0)
Calcium, Ion: 1.05 mmol/L — ABNORMAL LOW (ref 1.15–1.40)
Calcium, Ion: 1.02 mmol/L — ABNORMAL LOW (ref 1.15–1.40)
Calcium, Ion: 0.93 mmol/L — ABNORMAL LOW (ref 1.15–1.40)
Calcium, Ion: 0.95 mmol/L — ABNORMAL LOW (ref 1.15–1.40)
Calcium, Ion: 0.91 mmol/L — ABNORMAL LOW (ref 1.15–1.40)
HCT: 36 % — ABNORMAL LOW (ref 39.0–52.0)
HCT: 28 % — ABNORMAL LOW (ref 39.0–52.0)
HCT: 27 % — ABNORMAL LOW (ref 39.0–52.0)
HCT: 28 % — ABNORMAL LOW (ref 39.0–52.0)
HCT: 27 % — ABNORMAL LOW (ref 39.0–52.0)
Hemoglobin: 12.2 g/dL — ABNORMAL LOW (ref 13.0–17.0)
Hemoglobin: 9.5 g/dL — ABNORMAL LOW (ref 13.0–17.0)
Hemoglobin: 9.2 g/dL — ABNORMAL LOW (ref 13.0–17.0)
Hemoglobin: 9.5 g/dL — ABNORMAL LOW (ref 13.0–17.0)
Hemoglobin: 9.2 g/dL — ABNORMAL LOW (ref 13.0–17.0)
O2 Saturation: 92 %
O2 Saturation: 100 %
O2 Saturation: 86 %
O2 Saturation: 97 %
O2 Saturation: 100 %
Patient temperature: 102.3
Potassium: 3.4 mmol/L — ABNORMAL LOW (ref 3.5–5.1)
Potassium: 3.8 mmol/L (ref 3.5–5.1)
Potassium: 3.7 mmol/L (ref 3.5–5.1)
Potassium: 3.7 mmol/L (ref 3.5–5.1)
Potassium: 3.6 mmol/L (ref 3.5–5.1)
Sodium: 148 mmol/L — ABNORMAL HIGH (ref 135–145)
Sodium: 150 mmol/L — ABNORMAL HIGH (ref 135–145)
Sodium: 150 mmol/L — ABNORMAL HIGH (ref 135–145)
Sodium: 150 mmol/L — ABNORMAL HIGH (ref 135–145)
Sodium: 149 mmol/L — ABNORMAL HIGH (ref 135–145)
TCO2: 27 mmol/L (ref 22–32)
TCO2: 29 mmol/L (ref 22–32)
TCO2: 28 mmol/L (ref 22–32)
TCO2: 28 mmol/L (ref 22–32)
TCO2: 28 mmol/L (ref 22–32)
pCO2 arterial: 30.3 mmHg — ABNORMAL LOW (ref 32.0–48.0)
pCO2 arterial: 41.7 mmHg (ref 32.0–48.0)
pCO2 arterial: 46.6 mmHg (ref 32.0–48.0)
pCO2 arterial: 48.4 mmHg — ABNORMAL HIGH (ref 32.0–48.0)
pCO2 arterial: 45.4 mmHg (ref 32.0–48.0)
pH, Arterial: 7.547 — ABNORMAL HIGH (ref 7.350–7.450)
pH, Arterial: 7.426 (ref 7.350–7.450)
pH, Arterial: 7.343 — ABNORMAL LOW (ref 7.350–7.450)
pH, Arterial: 7.366 (ref 7.350–7.450)
pH, Arterial: 7.373 (ref 7.350–7.450)
pO2, Arterial: 60 mmHg — ABNORMAL LOW (ref 83.0–108.0)
pO2, Arterial: 181 mmHg — ABNORMAL HIGH (ref 83.0–108.0)
pO2, Arterial: 53 mmHg — ABNORMAL LOW (ref 83.0–108.0)
pO2, Arterial: 97 mmHg (ref 83.0–108.0)
pO2, Arterial: 321 mmHg — ABNORMAL HIGH (ref 83.0–108.0)

## 2020-05-15 LAB — BASIC METABOLIC PANEL
Anion gap: 12 (ref 5–15)
BUN: 17 mg/dL (ref 8–23)
CO2: 26 mmol/L (ref 22–32)
Calcium: 7.4 mg/dL — ABNORMAL LOW (ref 8.9–10.3)
Chloride: 110 mmol/L (ref 98–111)
Creatinine, Ser: 1.41 mg/dL — ABNORMAL HIGH (ref 0.61–1.24)
GFR calc Af Amer: 56 mL/min — ABNORMAL LOW (ref >60)
GFR calc non Af Amer: 49 mL/min — ABNORMAL LOW (ref >60)
Glucose, Bld: 167 mg/dL — ABNORMAL HIGH (ref 70–99)
Potassium: 3.7 mmol/L (ref 3.5–5.1)
Sodium: 148 mmol/L — ABNORMAL HIGH (ref 135–145)

## 2020-05-15 LAB — GLUCOSE, CAPILLARY
Glucose-Capillary: 145 mg/dL — ABNORMAL HIGH (ref 70–99)
Glucose-Capillary: 146 mg/dL — ABNORMAL HIGH (ref 70–99)
Glucose-Capillary: 118 mg/dL — ABNORMAL HIGH (ref 70–99)
Glucose-Capillary: 169 mg/dL — ABNORMAL HIGH (ref 70–99)
Glucose-Capillary: 171 mg/dL — ABNORMAL HIGH (ref 70–99)

## 2020-05-15 LAB — CBC
HCT: 31.7 % — ABNORMAL LOW (ref 39.0–52.0)
Hemoglobin: 9.7 g/dL — ABNORMAL LOW (ref 13.0–17.0)
MCH: 26.4 pg (ref 26.0–34.0)
MCHC: 30.6 g/dL (ref 30.0–36.0)
MCV: 86.1 fL (ref 80.0–100.0)
Platelets: 468 10*3/uL — ABNORMAL HIGH (ref 150–400)
RBC: 3.68 MIL/uL — ABNORMAL LOW (ref 4.22–5.81)
RDW: 15.7 % — ABNORMAL HIGH (ref 11.5–15.5)
WBC: 17.1 10*3/uL — ABNORMAL HIGH (ref 4.0–10.5)
nRBC: 0.5 % — ABNORMAL HIGH (ref 0.0–0.2)

## 2020-05-15 LAB — PHOSPHORUS: Phosphorus: 2.7 mg/dL (ref 2.5–4.6)

## 2020-05-15 LAB — LACTIC ACID, PLASMA: Lactic Acid, Venous: 3.4 mmol/L (ref 0.5–1.9)

## 2020-05-15 LAB — TRIGLYCERIDES: Triglycerides: 158 mg/dL — ABNORMAL HIGH (ref <150)

## 2020-05-15 LAB — MAGNESIUM: Magnesium: 1.9 mg/dL (ref 1.7–2.4)

## 2020-05-15 SURGERY — LAPAROTOMY, EXPLORATORY
Anesthesia: General | Site: Chest

## 2020-05-15 MED ORDER — CHLORHEXIDINE GLUCONATE 0.12% ORAL RINSE (MEDLINE KIT)
15.0000 mL | Freq: Two times a day (BID) | OROMUCOSAL | Status: DC
  Administered 2020-05-15 – 2020-05-21 (×13): 15 mL via OROMUCOSAL

## 2020-05-15 MED ORDER — DEXMEDETOMIDINE HCL IN NACL 200 MCG/50ML IV SOLN
INTRAVENOUS | Status: DC | PRN
Start: 2020-05-15 — End: 2020-05-15
  Administered 2020-05-15: .7 ug/kg/h via INTRAVENOUS

## 2020-05-15 MED ORDER — FENTANYL CITRATE (PF) 250 MCG/5ML IJ SOLN
INTRAMUSCULAR | Status: AC
  Filled 2020-05-15: qty 5

## 2020-05-15 MED ORDER — NOREPINEPHRINE 4 MG/250ML-% IV SOLN
0.0000 ug/min | INTRAVENOUS | Status: AC
  Administered 2020-05-15: 2 ug/min via INTRAVENOUS
  Filled 2020-05-15: qty 250

## 2020-05-15 MED ORDER — LIDOCAINE 2% (20 MG/ML) 5 ML SYRINGE
INTRAMUSCULAR | Status: AC
  Filled 2020-05-15: qty 5

## 2020-05-15 MED ORDER — ROCURONIUM BROMIDE 10 MG/ML (PF) SYRINGE
PREFILLED_SYRINGE | INTRAVENOUS | Status: AC
  Filled 2020-05-15: qty 20

## 2020-05-15 MED ORDER — FENTANYL CITRATE (PF) 100 MCG/2ML IJ SOLN
25.0000 ug | Freq: Once | INTRAMUSCULAR | Status: AC
  Administered 2020-05-15: 25 ug via INTRAVENOUS

## 2020-05-15 MED ORDER — DEXAMETHASONE SODIUM PHOSPHATE 10 MG/ML IJ SOLN
INTRAMUSCULAR | Status: DC | PRN
  Administered 2020-05-15: 10 mg via INTRAVENOUS

## 2020-05-15 MED ORDER — ORAL CARE MOUTH RINSE
15.0000 mL | OROMUCOSAL | Status: DC
  Administered 2020-05-15 – 2020-05-18 (×30): 15 mL via OROMUCOSAL

## 2020-05-15 MED ORDER — FUROSEMIDE 10 MG/ML IJ SOLN
40.0000 mg | Freq: Once | INTRAMUSCULAR | Status: AC
  Administered 2020-05-15: 40 mg via INTRAVENOUS
  Filled 2020-05-15: qty 4

## 2020-05-15 MED ORDER — ROCURONIUM BROMIDE 10 MG/ML (PF) SYRINGE
PREFILLED_SYRINGE | INTRAVENOUS | Status: DC | PRN
  Administered 2020-05-15: 50 mg via INTRAVENOUS
  Administered 2020-05-15 (×2): 100 mg via INTRAVENOUS

## 2020-05-15 MED ORDER — LACTATED RINGERS IV SOLN: INTRAVENOUS | Status: DC | PRN

## 2020-05-15 MED ORDER — FENTANYL CITRATE (PF) 100 MCG/2ML IJ SOLN
INTRAMUSCULAR | Status: DC | PRN
  Administered 2020-05-15: 50 ug via INTRAVENOUS
  Administered 2020-05-15 (×2): 100 ug via INTRAVENOUS

## 2020-05-15 MED ORDER — PROPOFOL 1000 MG/100ML IV EMUL
0.0000 ug/kg/min | INTRAVENOUS | Status: DC
  Administered 2020-05-15: 25 ug/kg/min via INTRAVENOUS
  Administered 2020-05-15: 30 ug/kg/min via INTRAVENOUS
  Administered 2020-05-16: 20 ug/kg/min via INTRAVENOUS
  Filled 2020-05-15 (×3): qty 100

## 2020-05-15 MED ORDER — SUCCINYLCHOLINE CHLORIDE 200 MG/10ML IV SOSY
PREFILLED_SYRINGE | INTRAVENOUS | Status: DC | PRN
  Administered 2020-05-15: 140 mg via INTRAVENOUS

## 2020-05-15 MED ORDER — PHENYLEPHRINE 40 MCG/ML (10ML) SYRINGE FOR IV PUSH (FOR BLOOD PRESSURE SUPPORT)
PREFILLED_SYRINGE | INTRAVENOUS | Status: DC | PRN
  Administered 2020-05-15 (×2): 80 ug via INTRAVENOUS
  Administered 2020-05-15: 120 ug via INTRAVENOUS
  Administered 2020-05-15: 80 ug via INTRAVENOUS

## 2020-05-15 MED ORDER — DEXAMETHASONE SODIUM PHOSPHATE 10 MG/ML IJ SOLN
INTRAMUSCULAR | Status: AC
  Filled 2020-05-15: qty 1

## 2020-05-15 MED ORDER — NOREPINEPHRINE 4 MG/250ML-% IV SOLN
0.0000 ug/min | INTRAVENOUS | Status: DC
  Administered 2020-05-15: 2 ug/min via INTRAVENOUS
  Administered 2020-05-15: 13 ug/min via INTRAVENOUS
  Administered 2020-05-16: 5 ug/min via INTRAVENOUS
  Filled 2020-05-15 (×3): qty 250

## 2020-05-15 MED ORDER — ONDANSETRON HCL 4 MG/2ML IJ SOLN
INTRAMUSCULAR | Status: DC | PRN
  Administered 2020-05-15: 4 mg via INTRAVENOUS

## 2020-05-15 MED ORDER — LACTATED RINGERS IV BOLUS
1000.0000 mL | Freq: Once | INTRAVENOUS | Status: AC
  Administered 2020-05-15: 1000 mL via INTRAVENOUS

## 2020-05-15 MED ORDER — ONDANSETRON HCL 4 MG/2ML IJ SOLN
INTRAMUSCULAR | Status: AC
  Filled 2020-05-15: qty 2

## 2020-05-15 MED ORDER — PHENYLEPHRINE HCL-NACL 10-0.9 MG/250ML-% IV SOLN
INTRAVENOUS | Status: DC | PRN
  Administered 2020-05-15: 50 ug/min via INTRAVENOUS

## 2020-05-15 MED ORDER — VASOPRESSIN 20 UNIT/ML IV SOLN
INTRAVENOUS | Status: AC
  Filled 2020-05-15: qty 1

## 2020-05-15 MED ORDER — ORAL CARE MOUTH RINSE: 15.0000 mL | Freq: Once | OROMUCOSAL | Status: DC

## 2020-05-15 MED ORDER — HYDROMORPHONE HCL 1 MG/ML IJ SOLN
1.0000 mg | Freq: Once | INTRAMUSCULAR | Status: AC
  Administered 2020-05-15: 1 mg via INTRAVENOUS
  Filled 2020-05-15: qty 1

## 2020-05-15 MED ORDER — CHLORHEXIDINE GLUCONATE 0.12 % MT SOLN: 15.0000 mL | Freq: Once | OROMUCOSAL | Status: DC

## 2020-05-15 MED ORDER — LACTATED RINGERS IV SOLN: INTRAVENOUS | Status: DC

## 2020-05-15 MED ORDER — PIPERACILLIN-TAZOBACTAM 3.375 G IVPB
3.3750 g | Freq: Three times a day (TID) | INTRAVENOUS | Status: DC
  Administered 2020-05-15 – 2020-05-17 (×6): 3.375 g via INTRAVENOUS
  Filled 2020-05-15 (×6): qty 50

## 2020-05-15 MED ORDER — ETOMIDATE 2 MG/ML IV SOLN
INTRAVENOUS | Status: DC | PRN
  Administered 2020-05-15: 14 mg via INTRAVENOUS

## 2020-05-15 MED ORDER — 0.9 % SODIUM CHLORIDE (POUR BTL) OPTIME
TOPICAL | Status: DC | PRN
  Administered 2020-05-15: 2000 mL
  Administered 2020-05-15: 1000 mL

## 2020-05-15 MED ORDER — FLUCONAZOLE 100MG IVPB: 100.0000 mg | INTRAVENOUS | Status: DC

## 2020-05-15 MED ORDER — FLUCONAZOLE IN SODIUM CHLORIDE 400-0.9 MG/200ML-% IV SOLN
800.0000 mg | Freq: Once | INTRAVENOUS | Status: AC
  Administered 2020-05-15: 800 mg via INTRAVENOUS
  Filled 2020-05-15 (×2): qty 400

## 2020-05-15 MED ORDER — FENTANYL 2500MCG IN NS 250ML (10MCG/ML) PREMIX INFUSION
25.0000 ug/h | INTRAVENOUS | Status: DC
  Administered 2020-05-15: 50 ug/h via INTRAVENOUS
  Administered 2020-05-16 (×2): 100 ug/h via INTRAVENOUS
  Administered 2020-05-17: 150 ug/h via INTRAVENOUS
  Administered 2020-05-18: 200 ug/h via INTRAVENOUS
  Filled 2020-05-15 (×4): qty 250

## 2020-05-15 MED ORDER — LACTATED RINGERS IV SOLN
INTRAVENOUS | Status: DC | PRN
Start: 2020-05-15 — End: 2020-05-15

## 2020-05-15 MED ORDER — FENTANYL BOLUS VIA INFUSION
25.0000 ug | INTRAVENOUS | Status: DC | PRN
  Administered 2020-05-16: 25 ug via INTRAVENOUS
  Filled 2020-05-15: qty 25

## 2020-05-15 MED ORDER — INDOCYANINE GREEN 25 MG IV SOLR
INTRAVENOUS | Status: DC | PRN
  Administered 2020-05-15: 2.5 mg via INTRAVENOUS

## 2020-05-15 MED ORDER — FLUCONAZOLE IN SODIUM CHLORIDE 400-0.9 MG/200ML-% IV SOLN
400.0000 mg | INTRAVENOUS | Status: DC
  Filled 2020-05-15: qty 200

## 2020-05-15 MED ORDER — HYDROMORPHONE HCL 1 MG/ML IJ SOLN
1.0000 mg | INTRAMUSCULAR | Status: DC | PRN
  Administered 2020-05-15 – 2020-05-18 (×3): 1 mg via INTRAVENOUS
  Filled 2020-05-15 (×3): qty 1

## 2020-05-15 MED ORDER — LIDOCAINE 2% (20 MG/ML) 5 ML SYRINGE
INTRAMUSCULAR | Status: DC | PRN
  Administered 2020-05-15: 100 mg via INTRAVENOUS

## 2020-05-15 MED ORDER — SUCCINYLCHOLINE CHLORIDE 200 MG/10ML IV SOSY
PREFILLED_SYRINGE | INTRAVENOUS | Status: AC
  Filled 2020-05-15: qty 10

## 2020-05-15 MED ORDER — NOREPINEPHRINE BITARTRATE 1 MG/ML IV SOLN
INTRAVENOUS | Status: DC | PRN
  Administered 2020-05-15: .5 mL via INTRAVENOUS

## 2020-05-15 MED ORDER — VASOPRESSIN 20 UNIT/ML IV SOLN
INTRAVENOUS | Status: DC | PRN
  Administered 2020-05-15 (×8): 1 [IU] via INTRAVENOUS

## 2020-05-15 MED ORDER — PROPOFOL 10 MG/ML IV BOLUS
INTRAVENOUS | Status: AC
  Filled 2020-05-15: qty 20

## 2020-05-15 MED ORDER — PHENYLEPHRINE 40 MCG/ML (10ML) SYRINGE FOR IV PUSH (FOR BLOOD PRESSURE SUPPORT)
PREFILLED_SYRINGE | INTRAVENOUS | Status: AC
  Filled 2020-05-15: qty 10

## 2020-05-15 MED ORDER — VASOPRESSIN 20 UNIT/ML IV SOLN
0.0300 [IU]/min | INTRAVENOUS | Status: AC
  Administered 2020-05-15: .03 [IU]/min via INTRAVENOUS
  Filled 2020-05-15: qty 2

## 2020-05-15 SURGICAL SUPPLY — 60 items
CATH THORACIC 28FR (CATHETERS) ×4 IMPLANT
CATH THORACIC 36FR RT ANG (CATHETERS) ×4 IMPLANT
CHLORAPREP W/TINT 26 (MISCELLANEOUS) ×4 IMPLANT
CLIP VESOCCLUDE MED 24/CT (CLIP) ×4 IMPLANT
CNTNR URN SCR LID CUP LEK RST (MISCELLANEOUS) ×4 IMPLANT
CONN 1/2X3/8X3/8 Y GISH (MISCELLANEOUS) ×4 IMPLANT
CONN Y 3/8X3/8X3/8  BEN (MISCELLANEOUS) ×2
CONN Y 3/8X3/8X3/8 BEN (MISCELLANEOUS) ×2 IMPLANT
CONT SPEC 4OZ STRL OR WHT (MISCELLANEOUS) ×4
COVER SURGICAL LIGHT HANDLE (MISCELLANEOUS) ×4 IMPLANT
DRAIN PENROSE 18X1/4 LTX STRL (DRAIN) ×4 IMPLANT
DRAPE LAPAROSCOPIC ABDOMINAL (DRAPES) ×4 IMPLANT
DRAPE WARM FLUID 44X44 (DRAPES) ×4 IMPLANT
ELECT BLADE 4.0 EZ CLEAN MEGAD (MISCELLANEOUS) ×4
ELECT CAUTERY BLADE 6.4 (BLADE) ×4 IMPLANT
ELECT REM PT RETURN 9FT ADLT (ELECTROSURGICAL) ×4
ELECTRODE BLDE 4.0 EZ CLN MEGD (MISCELLANEOUS) ×2 IMPLANT
ELECTRODE REM PT RTRN 9FT ADLT (ELECTROSURGICAL) ×2 IMPLANT
GAUZE SPONGE 4X4 12PLY STRL (GAUZE/BANDAGES/DRESSINGS) ×4 IMPLANT
GLOVE BIO SURGEON STRL SZ 6 (GLOVE) ×8 IMPLANT
GLOVE INDICATOR 6.5 STRL GRN (GLOVE) ×8 IMPLANT
GLOVE NEODERM STRL 7.5 LF PF (GLOVE) ×2 IMPLANT
GLOVE SURG NEODERM 7.5  LF PF (GLOVE) ×2
GOWN STRL REUS W/ TWL LRG LVL3 (GOWN DISPOSABLE) ×6 IMPLANT
GOWN STRL REUS W/TWL LRG LVL3 (GOWN DISPOSABLE) ×6
HANDLE STAPLE ENDO GIA SHORT (STAPLE) ×1
HANDLE SUCTION POOLE (INSTRUMENTS) ×2 IMPLANT
KIT BASIN OR (CUSTOM PROCEDURE TRAY) ×4 IMPLANT
KIT IMAGING PINPOINTPAQ (MISCELLANEOUS) ×4 IMPLANT
KIT SUCTION CATH 14FR (SUCTIONS) ×4 IMPLANT
KIT TURNOVER KIT B (KITS) ×4 IMPLANT
NEEDLE FILTER BLUNT 18X 1/2SAF (NEEDLE) ×2
NEEDLE FILTER BLUNT 18X1 1/2 (NEEDLE) ×2 IMPLANT
NS IRRIG 1000ML POUR BTL (IV SOLUTION) ×12 IMPLANT
PACK CHEST (CUSTOM PROCEDURE TRAY) ×4 IMPLANT
PACK GENERAL/GYN (CUSTOM PROCEDURE TRAY) ×4 IMPLANT
PACK SPY-PHI (KITS) ×4 IMPLANT
PAD ARMBOARD 7.5X6 YLW CONV (MISCELLANEOUS) ×4 IMPLANT
PENCIL SMOKE EVACUATOR (MISCELLANEOUS) ×4 IMPLANT
RELOAD EGIA 45 MED/THCK PURPLE (STAPLE) ×8 IMPLANT
SLEEVE SUCTION CATH 165 (SLEEVE) ×4 IMPLANT
SPECIMEN JAR LARGE (MISCELLANEOUS) IMPLANT
SPONGE ABD ABTHERA ADVANCE (MISCELLANEOUS) ×4 IMPLANT
SPONGE LAP 18X18 RF (DISPOSABLE) ×8 IMPLANT
STAPLER ENDO GIA 12MM SHORT (STAPLE) ×3 IMPLANT
SUCTION POOLE HANDLE (INSTRUMENTS) ×4
SUT MNCRL AB 3-0 PS2 18 (SUTURE) ×8 IMPLANT
SUT PDS AB 1 CTX 36 (SUTURE) ×8 IMPLANT
SUT PROLENE 2 0 CT 1 (SUTURE) ×4 IMPLANT
SUT SILK  1 MH (SUTURE) ×4
SUT SILK 1 MH (SUTURE) ×4 IMPLANT
SUT SILK 2 0 SH CR/8 (SUTURE) ×4 IMPLANT
SUT SILK 2 0 TIES 10X30 (SUTURE) ×4 IMPLANT
SUT SILK 3 0 TIES 10X30 (SUTURE) ×4 IMPLANT
SUT VIC AB 2-0 CTX 27 (SUTURE) ×4 IMPLANT
SUT VIC AB 2-0 SH 27 (SUTURE) ×2
SUT VIC AB 2-0 SH 27XBRD (SUTURE) ×2 IMPLANT
SUT VICRYL 2 TP 1 (SUTURE) ×4 IMPLANT
TOWEL GREEN STERILE (TOWEL DISPOSABLE) ×4 IMPLANT
WATER STERILE IRR 1000ML POUR (IV SOLUTION) ×4 IMPLANT

## 2020-05-15 NOTE — Progress Notes (Signed)
PCCM Interval progress note:  Paged to see patient for increasing abdominal pain, fever and tachypnea overnight. POD #3 after exlap and partial gastrectomy for necrotic stomach.     On evaluation, pt is moaning and in obvious discomfort.  He is on Dilaudid PCA without basal rate, RN is having to remind pt to push button as he is become slightly less oriented.   He will answer questions, but with one word answers.  Belly is tight and extremely painful with cloudy sero-sanguinous drainage in drain and absent bowel sounds.  Lungs are CTAB, last documented BM 5/19.  Fever up to 103F, currently on Ancef.  KUB:  Vague gas lucency subjacent to the right hemidiaphragm, suspicious for possible free air. While this could be related to recent laparotomy, possible perforated viscus could also be considered. Further assessed with dedicated cross-sectional imaging of the abdomen and pelvis recommended as clinically warranted.   CXR: Cardiomegaly with perihilar vascular congestion and scattered bilateral airspace opacities, favored to reflect pulmonary congestion/edema. Superimposed left pleural effusion. Associated dense left basilar opacity, which could reflect atelectasis or infiltrate.  Lactic acid 3.2, WBC down-trending to 20k, worsening creatinine 1.4  A/P: Severely worsening abdominal pain, lactic acidosis, fever and tachypnea, concern for intra-abdominal necrosis  -RN will contact surgery to review KUB with free air -Add Zosyn for anaerobic coverage  -repeat blood cultures -Dilaudid 1mg  x1 -ABG with respiratory alkalosis from hyperventilation secondary to pain, support with [employer] oxygen, but is currently protecting his airway  Ethan Catheline Hixon, PA-C   CRITICAL CARE Performed by: Ethan Johnson   Total critical care time: 35 minutes  Critical care time was exclusive of separately billable procedures and treating other patients.  Critical care was necessary to treat or prevent imminent  or life-threatening deterioration.  Critical care was time spent personally by me on the following activities: development of treatment plan with patient and/or surrogate as well as nursing, discussions with consultants, evaluation of patient's response to treatment, examination of patient, obtaining history from patient or surrogate, ordering and performing treatments and interventions, ordering and review of laboratory studies, ordering and review of radiographic studies, pulse oximetry and re-evaluation of patient's condition.   Ethan Gasman Jacoba Cherney, PA-C

## 2020-05-15 NOTE — Anesthesia Procedure Notes (Signed)
Procedure Name: Intubation Date/Time: 05/17/2020 2:34 PM Performed by: Jed Limerick, CRNA Pre-anesthesia Checklist: Patient identified, Emergency Drugs available, Suction available and Patient being monitored Oxygen Delivery Method: Circle System Utilized Preoxygenation: Pre-oxygenation with 100% oxygen Laryngoscope Size: Glidescope and 4 Grade View: Grade I Tube type: Parker flex tip Tube size: 8.0 mm Number of attempts: 1 Airway Equipment and Method: Video-laryngoscopy (cook catheter) Placement Confirmation: ETT inserted through vocal cords under direct vision,  positive ETCO2 and breath sounds checked- equal and bilateral Secured at: 24 cm Tube secured with: Tape Dental Injury: Teeth and Oropharynx as per pre-operative assessment  Difficulty Due To: Difficulty was anticipated Comments: ETT exchanged using cook catheter with glidescope assistance. 8.0 Parker ETT secured at 24 cm at the lip.

## 2020-05-15 NOTE — Anesthesia Procedure Notes (Signed)
Arterial Line Insertion Start/End5/23/2021 10:34 AM Performed by: Dairl Ponder, CRNA, CRNA  Patient location: OR. Preanesthetic checklist: patient identified, IV checked, site marked, risks and benefits discussed, surgical consent, monitors and equipment checked, pre-op evaluation, timeout performed and anesthesia consent Lidocaine 1% used for infiltration Left, radial was placed Catheter size: 20 G Hand hygiene performed  and maximum sterile barriers used   Attempts: 1 Procedure performed without using ultrasound guided technique. Following insertion, dressing applied and Biopatch. Post procedure assessment: normal and unchanged  Patient tolerated the procedure well with no immediate complications.

## 2020-05-15 NOTE — Op Note (Signed)
Procedure(s): EXPLORATORY LAPAROTOMY Video Assisted Thoracoscopy (Vats)/Thorocotomy Application Of Abthera Wound Vac Procedure Note  Ethan Johnson male 74 y.o. 03-Jun-2020  Procedure(s) and Anesthesia Type: Panel 1:    * EXPLORATORY LAPAROTOMY - General    * Application Of Abthera Wound Vac - General  Panel 2:    Left Thorocotomy - General  Surgeon(s) and Role: Panel 1:    * Berna Bue, MD - Primary    * Griselda Miner, MD - Assisting Panel 2:    * Linden Dolin, MD - Primary   Indications: I was asked to see the patient intraoperatively by the General Surgery operative team due to extensive proximal stomach/GEJ necrosis which could not be adequately mobilized via laparotomy. We agreed the best option was left thoracotomy to obtain source control of infection.        Surgeon: Linden Dolin   Assistants: Dr. Fredricka Bonine  Anesthesia: General endotracheal - Double lumen tube  ASA Class: 4    Procedure Detail  EXPLORATORY LAPAROTOMY, Application Of Abthera Wound Vac The patient was placed in the right lateral decubitus position. The left chest was cleansed and draped sterilely. A preoperative pause was performed. A posterolateral thoracotomy incision was made through the left 7th interspace. The 8th rib was shingled. The chest was entered safely. Turbid fluid was encountered and evacuated. The distal esophagus was encircled with a penrose drain. The hiatus was dissected allowing retraction of the residual stomach from the abdomen into the chest. This was resected and passed off the field. The distal esophagus was stapled and the staple line was marked with a long 2-0 prolene. Two chest drains were placed. The thoracotomy was closed in layers. Staples reapproximated the skin. All sponge, instrument, and needle counts were correct.  Findings: [necrotic GEJ lesion]  Estimated Blood Loss:  less than 50 mL         Drains: 2 left pleural chest tubes          Blood  Given: none          Specimens: GEJ         Implants: none        Complications:  * No complications entered in OR log *         Disposition: ICU - extubated and stable.         Condition: stable

## 2020-05-15 NOTE — Transfer of Care (Signed)
Immediate Anesthesia Transfer of Care Note  Patient: BOCEPHUS CALI  Procedure(s) Performed: EXPLORATORY LAPAROTOMY (N/A Abdomen) Application Of Abthera Wound Vac (N/A Abdomen) Video Assisted Thoracoscopy (Vats)/Thorocotomy (Left Chest)  Patient Location: ICU  Anesthesia Type:General  Level of Consciousness: sedated and Patient remains intubated per anesthesia plan  Airway & Oxygen Therapy: Patient remains intubated per anesthesia plan and Patient placed on Ventilator (see vital sign flow sheet for setting)  Post-op Assessment: Report given to RN and Post -op Vital signs reviewed and stable  Post vital signs: Reviewed and stable  Last Vitals:  Vitals Value Taken Time  BP    Temp    Pulse 110 05/11/2020 1502  Resp 16 05/22/2020 1502  SpO2 100 % 04/26/2020 1502  Vitals shown include unvalidated device data.  Last Pain:  Vitals:   04/28/2020 0829  TempSrc:   PainSc: 10-Worst pain ever      Patients Stated Pain Goal: 0 (05/10/20 2000)  Complications: No apparent anesthesia complications

## 2020-05-15 NOTE — Progress Notes (Signed)
eLink Physician-Brief Progress Note Patient Name: Ethan Johnson DOB: [birth year] MRN: [id number]   Date of Service  05/14/2020  HPI/Events of Note  Called d/t onging abdominal pain not relieved by PCA dilaudid and RR = 40. Nurse was asked to contract surgery about abdominal pain, however, nurse has not spoken with surgery.   eICU Interventions  Plan: 1. Portable CXR STAT. 2. ABG STAT. 3. Will ask ground team to evaluate the patient at bedside.      Intervention Category Major Interventions: Other:  Lenell Antu 04/24/2020, 3:33 AM

## 2020-05-15 NOTE — Anesthesia Procedure Notes (Signed)
Central Venous Catheter Insertion Performed by: Kipp Brood, MD, anesthesiologist Start/End05/28/2021 10:50 AM, 05/02/2020 10:55 AM Patient location: Pre-op. Preanesthetic checklist: patient identified, IV checked, site marked, risks and benefits discussed, surgical consent, monitors and equipment checked, pre-op evaluation, timeout performed and anesthesia consent Lidocaine 1% used for infiltration and patient sedated Hand hygiene performed  and maximum sterile barriers used  Catheter size: 8 Fr Total catheter length 16. Central line was placed.Double lumen Procedure performed using ultrasound guided technique. Ultrasound Notes:image(s) printed for medical record Attempts: 1 Following insertion, dressing applied and line sutured. Post procedure assessment: blood return through all ports  Patient tolerated the procedure well with no immediate complications.

## 2020-05-15 NOTE — Progress Notes (Addendum)
Spoke to Surgery MD Dwain Sarna about patient's pain and respiratory rate. New orders received for an abdominal x ray. Will continue to monitor.

## 2020-05-15 NOTE — Progress Notes (Signed)
CRITICAL VALUE ALERT  Critical Value:  Lactic acid 3.4.   Date & Time Notied:  05/07/2020 8016.  Provider Notified: Dr. Arsenio Loader of CCM; Dr Dwain Sarna of surgery.  Orders Received/Actions taken: None as of yet.

## 2020-05-15 NOTE — Anesthesia Preprocedure Evaluation (Signed)
Anesthesia Evaluation  Patient identified by MRN, date of birth, ID band Patient confused    Reviewed: Allergy & Precautions, NPO status , Patient's Chart, lab work & pertinent test results, Unable to perform ROS - Chart review only  Airway Mallampati: III  TM Distance: >3 FB     Dental  (+) Teeth Intact, Poor Dentition   Pulmonary former smoker,    + rhonchi        Cardiovascular  Rhythm:Regular Rate:Tachycardia     Neuro/Psych    GI/Hepatic   Endo/Other  diabetes  Renal/GU      Musculoskeletal   Abdominal   Peds  Hematology   Anesthesia Other Findings   Reproductive/Obstetrics                             Anesthesia Physical Anesthesia Plan  ASA: IV and emergent  Anesthesia Plan: General   Post-op Pain Management:    Induction: Intravenous, Cricoid pressure planned and Rapid sequence  PONV Risk Score and Plan: Ondansetron  Airway Management Planned: Oral ETT  Additional Equipment: Arterial line and CVP  Intra-op Plan:   Post-operative Plan: Post-operative intubation/ventilation  Informed Consent: I have reviewed the patients History and Physical, chart, labs and discussed the procedure including the risks, benefits and alternatives for the proposed anesthesia with the patient or authorized representative who has indicated his/her understanding and acceptance.       Plan Discussed with: CRNA and Anesthesiologist  Anesthesia Plan Comments:         Anesthesia Quick Evaluation

## 2020-05-15 NOTE — Progress Notes (Signed)
[facility] Progress Note  3 Days Post-Op  Subjective: CC:  Worsening pain, tachypnea, leukocytosis  Vital signs in last 24 hours: Temp:  [98.9 F (37.2 C)-103.1 F (39.5 C)] 99.3 F (37.4 C) (05/23 0800) Pulse Rate:  [104-137] 116 (05/23 0800) Resp:  [16-44] 16 (05/23 0829) BP: (108-175)/(54-91) 130/61 (05/23 0800) SpO2:  [90 %-100 %] 99 % (05/23 0829) FiO2 (%):  [40 %] 40 % (05/23 0829) Weight:  [108.5 kg] 108.5 kg (05/23 0500) Last BM Date: 2020-05-27  Intake/Output from previous day: 05/22 0701 - 05/23 0700 In: 1012.7 [I.V.:333.1; IV Piggyback:679.6] Out: 9024 [Urine:3765; Emesis/NG output:20; Drains:565] Intake/Output this shift: Total I/O In: 72.2 [I.V.:60.8; IV Piggyback:11.4] Out: -   PE: Gen:  African [place] male who appears stated age Card:  Regular rhythm, tachycardic Pulm:  Tachypneic, unlabored Abd: Soft, distended, TTP with rebound tenderness, midline honeycomb dressing c/d/i  GJ tube - G port to gravity with bilious fluid in gravity bag  Naso-esophageal tube to LIWS  JP - fills up with air, murky fluid  GU: indwelling foley clear/yellow urine Skin: warm and dry, no rashes  Psych: A&Ox3   Lab Results:  Recent Labs    05/14/20 0951 05/14/20 0951 04/26/2020 0417 05/07/2020 0449  WBC 32.6*  --  20.9*  --   HGB 9.5*   < > 11.1* 12.2*  HCT 29.9*   < > 35.0* 36.0*  PLT 341  --  400  --    < > = values in this interval not displayed.   BMET Recent Labs    05/14/20 0951 05/14/20 0951 05/03/2020 0417 05/05/2020 0449  NA 147*   < > 148* 148*  K 3.8   < > 3.7 3.4*  CL 109  --  110  --   CO2 25  --  26  --   GLUCOSE 210*  --  167*  --   BUN 16  --  17  --   CREATININE 1.29*  --  1.41*  --   CALCIUM 7.3*  --  7.4*  --    < > = values in this interval not displayed.   PT/INR No results for input(s): LABPROT, INR in the last 72 hours. CMP     Component Value Date/Time   NA 148 (H) 04/28/2020 0449   K 3.4 (L) 05/03/2020 0449   CL 110  05/14/2020 0417   CO2 26 05/13/2020 0417   GLUCOSE 167 (H) 05/18/2020 0417   BUN 17 05/05/2020 0417   CREATININE 1.41 (H) 05/05/2020 0417   CALCIUM 7.4 (L) 05/18/2020 0417   PROT 5.3 (L) 05/14/2020 0951   ALBUMIN 1.6 (L) 05/14/2020 0951   AST 21 05/14/2020 0951   ALT 15 05/14/2020 0951   ALKPHOS 72 05/14/2020 0951   BILITOT 1.1 05/14/2020 0951   GFRNONAA 49 (L) 05/08/2020 0417   GFRAA 56 (L) 05/07/2020 0417   Lipase     Component Value Date/Time   LIPASE 24 05/04/2020 0239       Studies/Results: CT ABDOMEN PELVIS WO CONTRAST  Result Date: 05/19/2020 CLINICAL DATA:  Abdominal pain/distension, status post subtotal gastrectomy for gastric necrosis on 05/20 EXAM: CT ABDOMEN AND PELVIS WITHOUT CONTRAST TECHNIQUE: Multidetector CT imaging of the abdomen and pelvis was performed following the standard protocol without IV contrast. COMPARISON:  05/11/2020 FINDINGS: Motion degraded images. Lower chest: Moderate bilateral pleural effusions, left greater than right. Associated lower lobe atelectasis. Hepatobiliary: Unenhanced liver is unremarkable. Layering small gallstones (series 3/image 41), without associated  inflammatory changes. Pancreas: Within normal limits. Spleen: Within normal limits. Adrenals/Urinary Tract: Right adrenal gland is within normal limits. Stable 3.3 cm left adrenal mass (series 3/image 32), previously characterized as a benign adrenal adenoma. Kidneys are within normal limits.  No hydronephrosis. Bladder decompressed by indwelling Foley catheter. Mildly thick-walled with nondependent gas. Stomach/Bowel: Enteric tube terminates in the distal esophagus (series 3/image 24). Status post subtotal gastrectomy. Patient remains in discontinuity. Residual stomach is reportedly present but poorly visualized. Percutaneous (gastro)jejunostomy is present in the anterior mid abdomen (series 3/image 45), and the enteric tube terminates in the 2nd/3rd portion of the duodenum (series 3/image  48). Gas and fluid in the left upper abdomen (series 3/image 18), without a well-defined collection at the current time. Additional scattered free air and moderate pneumoperitoneum beneath the anterior abdominal wall (series 3/image 34). While there is an adjacent suture line related to the reported gastrojejunostomy, the pneumoperitoneum itself is nondependent. Overall, these findings are within the upper range of normal for recent surgery, particularly given gastric necrosis, and do not by themselves indicate anastomotic breakdown/leak. Visualized bowel is otherwise unremarkable, including a normal appendix (series 3/image 84). Vascular/Lymphatic: No evidence of abdominal aortic aneurysm. Atherosclerotic calcifications of the abdominal aorta and branch vessels. No suspicious abdominopelvic lymphadenopathy. Reproductive: Marked prostatomegaly, suggesting BPH. Other: Moderate pneumoperitoneum, as described above. Gas and fluid in the left upper abdomen, as described above. Surgical drain terminating in the portacaval region (series 3/image 32). Additional small volume abdominal ascites. Musculoskeletal: Degenerative changes of the visualized thoracolumbar spine. IMPRESSION: Postsurgical changes related to subtotal gastrectomy. Discontinuous residual stomach with percutaneous gastrojejunostomy terminating in the mid duodenum. Associated gas and fluid in the left upper abdomen, without well-defined collection at the current time. Scattered free air and moderate pneumoperitoneum. Additional small volume abdominal ascites. Overall, these findings are considered within the upper range of normal for the recent surgery. Surgical drain terminating in the portacaval region. These results were called by telephone at the time of interpretation on 05/01/2020 at 7:16 am to provider Dr Phylliss Blakes, who verbally acknowledged these results. Electronically Signed   By: Charline Bills M.D.   On: 04/26/2020 07:18   DG Abd 1  View - KUB  Result Date: 05/14/2020 CLINICAL DATA:  Abdominal pain. Exam was ordered to rule out pneumoperitoneum. EXAM: ABDOMEN - 1 VIEW COMPARISON:  May 11, 2020 FINDINGS: Limited view of the abdomen in supine position, with exclusion of the hemidiaphragms demonstrates nonobstructive bowel gas pattern. Paucity of gas is noted. The tip of probable enteric catheter overlies the lower mid thorax. Jejunostomy tube is seen it in the upper abdomen. IMPRESSION: 1. Limited view of the abdomen in supine position, with exclusion of the hemidiaphragms demonstrates nonobstructive bowel gas pattern. 2. The tip of probable enteric catheter overlies the lower mid thorax. 3. No frank pneumoperitoneum is seen, however this is a very limited view of the abdomen. If pneumoperitoneum is considered clinically, decubital or semi upright (if possible) view of the abdomen may be considered. Electronically Signed   By: Ted Mcalpine M.D.   On: 05/14/2020 12:18   DG CHEST PORT 1 VIEW  Result Date: 04/27/2020 CLINICAL DATA:  Initial evaluation for tachypnea. EXAM: PORTABLE CHEST 1 VIEW COMPARISON:  Prior radiograph from 07/13/2017. FINDINGS: Enteric tube in place with tip position near the GE junction, side hole in the distal esophagus. Cardiomegaly. Mediastinal silhouette within normal limits. Lungs hypoinflated. Perihilar vascular congestion with scattered bilateral airspace opacities, favored to in large part reflect edema/congestion. Superimposed left  pleural effusion. Dense left basilar opacity could reflect atelectasis or infiltrate. No pneumothorax. Probable free air underneath the right hemidiaphragm, consistent with history of recent laparotomy. No acute osseous abnormality. Degenerative changes noted about the right shoulder. IMPRESSION: 1. Enteric tube in place with tip near the GE junction, side hole in the distal esophagus. 2. Cardiomegaly with perihilar vascular congestion and scattered bilateral airspace  opacities, favored to reflect pulmonary congestion/edema. 3. Superimposed left pleural effusion. Associated dense left basilar opacity, which could reflect atelectasis or infiltrate. 4. Suspected free air subjacent to the right hemidiaphragm, likely related to recent laparotomy. If there is clinical concern for an underlying perforated viscus, then further assessment with dedicated cross-sectional imaging of the abdomen and pelvis would be warranted. Electronically Signed   By: Rise Mu M.D.   On: 05/20/2020 05:03   DG Abd Portable 1V  Result Date: 05/05/2020 CLINICAL DATA:  Initial evaluation for acute abdominal pain, evaluate for perforation. EXAM: PORTABLE ABDOMEN - 1 VIEW COMPARISON:  Prior radiograph from 05/14/2020. FINDINGS: Enteric tube in place with tip position near the GE junction, side hole in the distal esophagus. Surgical drain and skin staples overlie the left abdomen. Visualized bowel gas pattern is nonobstructive. Vague gas lucency seen subjacent to the right hemidiaphragm, suspicious for possible free air. While this could be related to recent laparotomy, possible perforated viscus could also be considered. No acute osseous finding. IMPRESSION: 1. Vague gas lucency subjacent to the right hemidiaphragm, suspicious for possible free air. While this could be related to recent laparotomy, possible perforated viscus could also be considered. Further assessed with dedicated cross-sectional imaging of the abdomen and pelvis recommended as clinically warranted. 2. Enteric tube in place with tip near the GE junction, side hole in the distal esophagus. 3. Nonobstructive bowel gas pattern. Current attempt is being made to convey these results to the clinician. Results will be communicated as soon as possible. Electronically Signed   By: Rise Mu M.D.   On: 05/10/2020 05:06    Anti-infectives: Anti-infectives (From admission, onward)   Start     Dose/Rate Route Frequency  Ordered Stop   04/26/2020 0600  piperacillin-tazobactam (ZOSYN) IVPB 3.375 g     3.375 g 12.5 mL/hr over 240 Minutes Intravenous Every 8 hours 04/26/2020 0544     05/10/20 0600  ceFAZolin (ANCEF) IVPB 2g/100 mL premix  Status:  Discontinued     2 g 200 mL/hr over 30 Minutes Intravenous Every 8 hours 05/09/20 1542 04/25/2020 0544   05/07/20 1000  cefTRIAXone (ROCEPHIN) 2 g in sodium chloride 0.9 % 100 mL IVPB  Status:  Discontinued     2 g 200 mL/hr over 30 Minutes Intravenous Every 24 hours 05/19/2020 1036 05/09/20 1542   05/05/2020 0745  cefTRIAXone (ROCEPHIN) 2 g in sodium chloride 0.9 % 100 mL IVPB     2 g 200 mL/hr over 30 Minutes Intravenous  Once 05/05/2020 0730 05/22/2020 0955     Assessment/Plan DM2  Adrenal incidentaloma Urinary retention enlarged prostate Klebsiella bacteremia  Acute hypoxic respiratory failure- resolved Gastric necrosis, gastric volvulus   POD#3 s/p partial gastrectomy, gastrojejunostomy tube placement  -Worsening pain and overall clinical picture with a large amount of air/fluid debris in LUQ, concerning for ongoing leak or ischemia. I recommend return to or for re-exploration. Likely open abdomen/ remain on vent post-op. I discussed with him and his wife who agree to proceed.  - NO MEDS per G or J tube.  - IV tylenol, IV robaxin, HM PCA for  pain.  - follow up AM labs  - follow surgical path  - Plan for repeat upper endoscopy Monday by Dr. Christella Hartigan to assess viability of distal esopagus, followed by possible esophagojejunostomy (EJ) by Dr. Daphine Deutscher later in the week.  FEN: NPO, IVF ID: [id number]  VTE: SCD's, SQH Foley: in place 2/2 prostatism with urosepsis   Appreciate CCM assistance in the management of this patient.   LOS: 9 days    Berna Bue MD [facility] Please see Amion for pager number during day hours 7:00am-4:30pm    Addendum:  More abd pain today, heart rate (the tachycardia is not new and is actually lower than previous)  and bp  are both ok today, some bloody drainage from ng tube, murky from jp drain, not sure if he still has issues that would need more surgery will check a lactate and kub today.  I think reasonable to just stop tube feeds and g tube meds for now( for some reason had pain with this last night).  Will place on pca also.discussed plan with patient

## 2020-05-15 NOTE — Progress Notes (Signed)
Notified [facility] about patient breathing rate in the 40s. Also notified that patient's pain seems uncontrolled, although patient rating pain as a 7 consistently throughout night. Awaiting call back/orders from [facility]-Link MD. Will continue to monitor.

## 2020-05-15 NOTE — Progress Notes (Signed)
E-Link MD Arsenio Loader and Surgery MD Dwain Sarna made aware of abdominal x ray findings. New orders from Dr. Dwain Sarna for abdominal-pelvis CT without contrast. Will continue to monitor.

## 2020-05-15 NOTE — Progress Notes (Signed)
Events noted and I discussed today's OR findings with Dr. Fredricka Bonine.  Signing off for now, please call if any questions or concerns.  Thanks

## 2020-05-15 NOTE — Progress Notes (Signed)
NAME:  Ethan Johnson, MRN:  [id number], DOB:  05-21-1946, LOS: 9 ADMISSION DATE:  05/16/2020, CONSULTATION DATE:  06-01-20 REFERRING MD:  Grandville Silos - Trauma, CHIEF COMPLAINT:  Gastric infarction.   HPI/course in hospital  74 year old man who originally presented 6 days ago with abdominal pain and was found to have urinary retention in the context of worsening symptoms of prostatism and an elevated PSA. Symptoms improved following insertion of Foley and drainage of 1.5L of urine.  SVT at that time which was felt to be sinus tachycardia as no conversion with adenosine. Hypotension and tachycardia improved initially with relief of obstruction.   Found to have Klebsiella bacteremia which was treated with ceftriaxone. Transitioned to orals and was to be discharged with Foley in place for outpatient urology evaluation.  Noted to have gastric distension either due to outlet obstruction or gastroparesis, exacerbated by acute illness.   5/19 developed sudden abdominal pain with tachypnea and desaturation. Loose dark stools.   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   5/20 OR  -  Subtotal gastrectomy with placement of jejunostomy tube. Normotensive throughout the case, minimal blood loss, fluids given.  Returned to ICU intubated.  Past Medical History   Past Medical History:  Diagnosis Date  . Adrenal incidentaloma ([place])   . Aortic atherosclerosis ([place])   . BPH (benign prostatic hyperplasia)   . Diabetes mellitus without complication ([place])   . Hepatic steatosis   . Neuropathy      Past Surgical History:  Procedure Laterality Date  . ESOPHAGOGASTRODUODENOSCOPY (EGD) WITH PROPOFOL N/A 2020/06/01   Procedure: ESOPHAGOGASTRODUODENOSCOPY (EGD) WITH PROPOFOL;  Surgeon: Milus Banister, MD;  Location: [facility] ENDOSCOPY;  Service: Endoscopy;  Laterality: N/A;  . LAPAROTOMY N/A 06-01-20   Procedure: SUBTOTAL GASTRECTOMY, JEJUNOSTOMY TUBE;  Surgeon: Jesusita Oka, MD;   Location: [place];  Service: General;  Laterality: N/A;     Interim history/subjective:  Extubated yesterday.  Remains in considerable amount of pain.  Return to the OR due to concern regarding gastric stump necrosis.  Purulent ascites was drained from the abdomen.:  Small bowel all up.  Viable.  Gastrojejunostomy was in good position.  Source was felt to be dehiscence of the proximal staple line at the level of the esophagus.  Patient then underwent thoracotomy and more proximal esophagectomy.  Objective   Blood pressure (!) 92/43, pulse (!) 114, temperature 99 F (37.2 C), temperature source Axillary, resp. rate 17, height 5\' 10"  (1.778 m), weight 108.5 kg, SpO2 100 %.    Vent Mode: PRVC FiO2 (%):  [40 %-80 %] 80 % Set Rate:  [16 bmp] 16 bmp Vt Set:  [580 mL] 580 mL PEEP:  [5 cmH20] 5 cmH20 Plateau Pressure:  [19 cmH20] 19 cmH20   Intake/Output Summary (Last 24 hours) at 04/27/2020 1856 Last data filed at 05/20/2020 1800 Gross per 24 hour  Intake 2556.42 ml  Output 2725 ml  Net -168.58 ml   Filed Weights   05/11/20 0409 June 01, 2020 1154 05/13/2020 0500  Weight: 118 kg 118 kg 108.5 kg    Examination: Physical Exam Constitutional:      Appearance: He is obese. He is ill-appearing.     Interventions: He is sedated and intubated.  HENT:     Head:     Comments: NGT tube in place with minimal bloody drainage.      Mouth/Throat:     Mouth: Mucous membranes are moist.  Eyes:  General: No scleral icterus. Neck:     Trachea: Trachea normal.     Comments: Orally intubated Cardiovascular:     Rate and Rhythm: Regular rhythm. Tachycardia present.     Heart sounds: Normal heart sounds.  Pulmonary:     Effort: Pulmonary effort is normal. He is intubated.     Breath sounds: Normal breath sounds and air entry.     Comments: Left chest tube Abdominal:     General: A surgical scar is present. Bowel sounds are decreased.     Tenderness: There is no abdominal tenderness.        Comments: Clean mid line incision. Jejunostomy RUQ. LUQ JP with  minimal bloody drainage age.  Genitourinary:    Comments: Foley in place. Neurological:     Mental Status: He is oriented to person, place, and time. He is unresponsive.      Ancillary tests (personally reviewed)  CBC: Recent Labs  Lab 05/07/2020 0629 05/04/2020 1407 05/13/20 0820 05/13/20 0820 05/14/20 0951 05/14/20 0951 05/22/2020 0417 05/22/2020 0449 05/16/2020 1102 05/20/2020 1247 04/25/2020 1319 04/24/2020 1417 05/09/2020 1700  WBC 27.2*  --  28.8*  --  32.6*  --  20.9*  --   --   --   --   --  17.1*  NEUTROABS  --   --   --   --  22.5*  --  15.1*  --   --   --   --   --   --   HGB 10.5*   < > 9.6*   < > 9.5*   < > 11.1*   < > 9.5* 9.2* 9.5* 9.2* 9.7*  HCT 31.9*   < > 30.2*   < > 29.9*   < > 35.0*   < > 28.0* 27.0* 28.0* 27.0* 31.7*  MCV 83.7  --  83.2  --  85.2  --  84.7  --   --   --   --   --  86.1  PLT 236  --  290  --  341  --  400  --   --   --   --   --  468*   < > = values in this interval not displayed.    Basic Metabolic Panel: Recent Labs  Lab 05/10/20 0459 05/10/20 0459 05/11/20 0331 05/11/20 0331 04/26/2020 0629 05/08/2020 0629 05/11/2020 1407 05/20/2020 1757 05/13/20 0820 05/13/20 1818 05/14/20 0951 05/14/20 0951 05/14/20 1612 04/25/2020 0417 05/20/2020 0417 05/13/2020 0449 05/07/2020 1102 05/05/2020 1247 05/14/2020 1319 04/29/2020 1417  NA 138   < > 139   < > 138   < > 141   < >  --   --  147*   < >  --  148*   < > 148* 150* 150* 150* 149*  K 3.4*   < > 3.9   < > 4.4   < > 3.2*   < >  --   --  3.8   < >  --  3.7   < > 3.4* 3.8 3.7 3.7 3.6  CL 103   < > 99  --  103  --  102  --   --   --  109  --   --  110  --   --   --   --   --   --   CO2 26  --  23  --  22  --   --   --   --   --  25  --   --  26  --   --   --   --   --   --   GLUCOSE 180*   < > 159*  --  147*  --  135*  --   --   --  210*  --   --  167*  --   --   --   --   --   --   BUN 20   < > 18  --  16  --  15  --   --   --  16  --   --  17  --   --    --   --   --   --   CREATININE 1.08   < > 1.06  --  1.24  --  1.10  --   --   --  1.29*  --   --  1.41*  --   --   --   --   --   --   CALCIUM 8.0*  --  8.0*  --  7.8*  --   --   --   --   --  7.3*  --   --  7.4*  --   --   --   --   --   --   MG  --   --   --   --   --   --   --   --  1.7 1.5* 1.8  --  1.7 1.9  --   --   --   --   --   --   PHOS  --   --   --   --   --   --   --   --   --  3.4 2.8  --  2.6 2.7  --   --   --   --   --   --    < > = values in this interval not displayed.   GFR: Estimated Creatinine Clearance: 56.7 mL/min (A) (by C-G formula based on SCr of 1.41 mg/dL (H)). Recent Labs  Lab 05/11/20 1846 05/11/20 2122 05/08/2020 0629 05/13/20 0820 05/14/20 0951 June 07, 2020 0417 06-07-20 1700  WBC  --   --    < > 28.8* 32.6* 20.9* 17.1*  LATICACIDVEN 1.4 1.4  --   --  1.8 3.4*  --    < > = values in this interval not displayed.    Liver Function Tests: Recent Labs  Lab 05/14/20 0951  AST 21  ALT 15  ALKPHOS 72  BILITOT 1.1  PROT 5.3*  ALBUMIN 1.6*   No results for input(s): LIPASE, AMYLASE in the last 168 hours. No results for input(s): AMMONIA in the last 168 hours.  ABG    Component Value Date/Time   PHART 7.373 06/07/2020 1417   PCO2ART 45.4 2020/06/07 1417   PO2ART 321 (H) June 07, 2020 1417   HCO3 26.4 06-07-2020 1417   TCO2 28 07-Jun-2020 1417   ACIDBASEDEF 0.4 05/13/2020 0825   O2SAT 100.0 06-07-2020 1417     Coagulation Profile: No results for input(s): INR, PROTIME in the last 168 hours.  Cardiac Enzymes: No results for input(s): CKTOTAL, CKMB, CKMBINDEX, TROPONINI in the last 168 hours.  HbA1C: Hgb A1c MFr Bld  Date/Time Value Ref Range Status  05/14/2020 09:44 AM 7.6 (H) 4.8 - 5.6 % Final    Comment:    (NOTE) Pre diabetes:  5.7%-6.4% Diabetes:              >6.4% Glycemic control for   <7.0% adults with diabetes     CBG: Recent Labs  Lab 05/14/20 1937 05/14/20 2326 05/05/2020 0339 05/14/2020 0743 05/19/2020 1510  GLUCAP  162* 152* 145* 146* 118*    Assessment & Plan:   Remains critically ill due to expected acute hypoxic respiratory failure following partial gastrectomy. - Analgesia for upper abdominal incision.  - At increased risk of post-operative pulmonary complications given body habitus and location of incision.  -Continue full ventilatory support  Critically ill due to hypovolemic and distributive shock requiring titration of vasopressors and fluid administration. Tachycardic and hypotensive at this time.  Likely still volume seeking. -Ongoing volume resuscitation -Follow CVP  Status post partial gastrectomy, Gastric volvulus due to possible gastroparesis from DM and acute illness. New abdominal pain with possible leakage from PEG site. - No manipulation of NGT -Hold feeds. -Investigate possible PEG dysfunction per surgery.  Urinary retention due to BPH - Leave Foley in place.  Daily Goals Checklist  Pain/Anxiety/Delirium protocol (if indicated): Fentanyl and propofol infusions. VAP protocol (if indicated): Bundle in place Respiratory support goals: Full ventilatory support Blood pressure target: Titrate norepinephrine to keep MAP greater than 65.  Follow CVP, fluid bolus for CVP less than 4. DVT prophylaxis: Heparin TID Nutritional status and feeding goals: moderate risk, feeds off due to tube dysfunction.. GI prophylaxis: Not required Fluid status goals: Ongoing fluid resuscitation Urinary catheter: urinary retention. Central lines: Arterial line, R IJ CVC Glucose control: well controlled on SSI. T2DM with HbA1C 7.6 Mobility/therapy needs: Mobilize to chair as tolerated. Antibiotic de-escalation: perioperative antibiotics only. Home medication reconciliation: on hold Daily labs: CBC, BMET Code Status: full  Family Communication: Family updated per surgery today Disposition: ICU   CRITICAL CARE Performed by: Lynnell Catalan   Total critical care time: 40 minutes  Critical care  time was exclusive of separately billable procedures and treating other patients.  Critical care was necessary to treat or prevent imminent or life-threatening deterioration.  Critical care was time spent personally by me on the following activities: development of treatment plan with patient and/or surrogate as well as nursing, discussions with consultants, evaluation of patient's response to treatment, examination of patient, obtaining history from patient or surrogate, ordering and performing treatments and interventions, ordering and review of laboratory studies, ordering and review of radiographic studies, pulse oximetry, re-evaluation of patient's condition and participation in multidisciplinary rounds.  Lynnell Catalan, MD [facility] ICU Physician [facility] Critical Care  Pager: [phone] Mobile: [phone] After hours: [phone].      05/13/2020, 6:56 PM

## 2020-05-15 NOTE — Anesthesia Procedure Notes (Signed)
Procedure Name: Intubation Date/Time: 05/08/2020 10:45 AM Performed by: Jed Limerick, CRNA Pre-anesthesia Checklist: Patient identified, Emergency Drugs available, Suction available and Patient being monitored Patient Re-evaluated:Patient Re-evaluated prior to induction Oxygen Delivery Method: Circle System Utilized Preoxygenation: Pre-oxygenation with 100% oxygen Induction Type: IV induction and Rapid sequence Laryngoscope Size: Glidescope and 4 Grade View: Grade I Tube type: Oral Tube size: 8.0 mm Number of attempts: 1 Airway Equipment and Method: Video-laryngoscopy and Rigid stylet Placement Confirmation: ETT inserted through vocal cords under direct vision,  positive ETCO2 and breath sounds checked- equal and bilateral Secured at: 23 cm Tube secured with: Tape Dental Injury: Teeth and Oropharynx as per pre-operative assessment  Difficulty Due To: Difficulty was anticipated

## 2020-05-15 NOTE — Progress Notes (Signed)
eLink Physician-Brief Progress Note Patient Name: Ethan Johnson DOB: [birth year] MRN: [id number]   Date of Service  29-May-2020  HPI/Events of Note  Review of films: 1. Enteric tube in place with tip near the GE junction, side hole in the distal esophagus. 2. Cardiomegaly with perihilar vascular congestion and scattered bilateral airspace opacities, favored to reflect pulmonary congestion/edema.  eICU Interventions  Will order: 1. Titrate [place] O2 as needed.  2. Lasix 40 mg IV X 1 now. 3. Advance gastric tube 15 cm and repeat abdominal film.     Intervention Category Major Interventions: Other:  Lenell Antu 05-29-20, 5:36 AM

## 2020-05-15 NOTE — Anesthesia Postprocedure Evaluation (Signed)
Anesthesia Post Note  Patient: Ethan Johnson  Procedure(s) Performed: EXPLORATORY LAPAROTOMY (N/A Abdomen) Application Of Abthera Wound Vac (N/A Abdomen) Video Assisted Thoracoscopy (Vats)/Thorocotomy (Left Chest)     Patient location during evaluation: NICU Anesthesia Type: General Level of consciousness: sedated and patient remains intubated per anesthesia plan Pain management: pain level controlled Vital Signs Assessment: post-procedure vital signs reviewed and stable Respiratory status: patient remains intubated per anesthesia plan and patient on ventilator - see flowsheet for VS Cardiovascular status: stable Anesthetic complications: no    Last Vitals:  Vitals:   May 20, 2020 0900 05/20/2020 1452  BP: 118/64   Pulse: (!) 117   Resp: (!) 31   Temp:    SpO2: 99% 96%    Last Pain:  Vitals:   May 20, 2020 0829  TempSrc:   PainSc: 10-Worst pain ever                 Ethan Johnson

## 2020-05-15 NOTE — Progress Notes (Signed)
Day shift RN contacted E-Link MD about patient's pain, and to see if there was some other medication to get on board. When E Link physician returned call, this RN was told to give dilaudid PCA more time to be effective, and to contact surgery if pain gets worse, and no new orders given. Patient has been rating pain as a 7 throughout night. At shift change with dayshift RN, patient rated pain as 10.

## 2020-05-15 NOTE — Op Note (Signed)
Operative Note  WOLFGANG FINIGAN  [id number]  [id number]  05/08/2020   Surgeon: Lady Deutscher ConnorMD  Assistant: Royston Sinner MD  Procedure performed: Reopening of abdomen for abdominal reexploration, placement of ABThera wound VAC  Preop diagnosis: septic shock Post-op diagnosis/intraop findings: Complete dehiscence of proximal staple line with necrotic proximal stomach/distal esophagus, diffuse abdominal contamination and purulent peritonitis  Specimens: no Retained items: Gastrojejunostomy tube is retained (NOT Stammed), surgical JP drain retained left upper quadrant, ABThera wound VAC for open abdomen EBL: minimal cc Complications: none  Description of procedure: After obtaining informed consent the patient was taken to the operating room and placed supine on operating room table wheregeneral endotracheal anesthesia was initiated, preoperative antibiotics were administered, SCDs applied, and a formal timeout was performed.  The abdomen was prepped and draped in usual sterile fashion.  Staples were removed from his wound and the fascial suture was removed.  Copious purulent ascites was encountered on entry to the abdomen.  This was aspirated and the bowel inspected.  The colon and small bowel all appeared viable, with several areas of purulent rind but no areas of ischemia or necrosis.  The remnant stomach appeared viable and the gastrojejunostomy tube was in good position.  Of note all the securing sutures between the stomach and the abdominal wall had pulled through but it did not seem as though the contamination was coming from leakage at this area.  We placed 3 additional securing sutures however during the course of the procedure even with gentle retraction these all seem to pull through so the tube was only secured by the balloon at this point.  The bookwalter was placed for fixed retraction and we explored the upper abdomen.  There was clearly ongoing tissue necrosis in this area.  We  identified what appeared to be a lumen, and had the anesthetist advance the NG tube which was palpable confirming that there was essentially complete dehiscence of the proximal staple line with tissue necrosis.  At this point we requested Dr. Vickey Sages give an opinion and between the 3 of [place] all agreed that further safe access to the distal esophagus through the abdomen would be extremely difficult given the patient's habitus and inflammation in the area; the safest approach would be to proceed with a left thoracotomy for more proximal esophagectomy.  The patient will be left in discontinuity and will ultimately likely require colonic interposition, possibly spit fistula depending on clinical course.   He will require return to the OR for abdominal washout and closure, possible revision of GJ to separate G and J tubes in the next couple days.  All counts were correct at the completion of the case.

## 2020-05-16 ENCOUNTER — Ambulatory Visit: Payer: No Typology Code available for payment source | Admitting: Neurology

## 2020-05-16 ENCOUNTER — Inpatient Hospital Stay (HOSPITAL_COMMUNITY): Payer: No Typology Code available for payment source

## 2020-05-16 ENCOUNTER — Inpatient Hospital Stay: Payer: Self-pay

## 2020-05-16 LAB — CBC WITH DIFFERENTIAL/PLATELET
Abs Immature Granulocytes: 0.18 10*3/uL — ABNORMAL HIGH (ref 0.00–0.07)
Basophils Absolute: 0.1 10*3/uL (ref 0.0–0.1)
Basophils Relative: 0 %
Eosinophils Absolute: 0 10*3/uL (ref 0.0–0.5)
Eosinophils Relative: 0 %
HCT: 27.4 % — ABNORMAL LOW (ref 39.0–52.0)
Hemoglobin: 8.6 g/dL — ABNORMAL LOW (ref 13.0–17.0)
Immature Granulocytes: 1 %
Lymphocytes Relative: 32 %
Lymphs Abs: 6.1 10*3/uL — ABNORMAL HIGH (ref 0.7–4.0)
MCH: 26.4 pg (ref 26.0–34.0)
MCHC: 31.4 g/dL (ref 30.0–36.0)
MCV: 84 fL (ref 80.0–100.0)
Monocytes Absolute: 1.1 10*3/uL — ABNORMAL HIGH (ref 0.1–1.0)
Monocytes Relative: 6 %
Neutro Abs: 11.8 10*3/uL — ABNORMAL HIGH (ref 1.7–7.7)
Neutrophils Relative %: 61 %
Platelets: 452 10*3/uL — ABNORMAL HIGH (ref 150–400)
RBC: 3.26 MIL/uL — ABNORMAL LOW (ref 4.22–5.81)
RDW: 15.8 % — ABNORMAL HIGH (ref 11.5–15.5)
WBC: 19.2 10*3/uL — ABNORMAL HIGH (ref 4.0–10.5)
nRBC: 0.6 % — ABNORMAL HIGH (ref 0.0–0.2)

## 2020-05-16 LAB — SURGICAL PATHOLOGY

## 2020-05-16 LAB — GLUCOSE, CAPILLARY
Glucose-Capillary: 221 mg/dL — ABNORMAL HIGH (ref 70–99)
Glucose-Capillary: 228 mg/dL — ABNORMAL HIGH (ref 70–99)
Glucose-Capillary: 222 mg/dL — ABNORMAL HIGH (ref 70–99)
Glucose-Capillary: 222 mg/dL — ABNORMAL HIGH (ref 70–99)
Glucose-Capillary: 225 mg/dL — ABNORMAL HIGH (ref 70–99)

## 2020-05-16 LAB — BASIC METABOLIC PANEL
Anion gap: 13 (ref 5–15)
BUN: 43 mg/dL — ABNORMAL HIGH (ref 8–23)
CO2: 21 mmol/L — ABNORMAL LOW (ref 22–32)
Calcium: 6.4 mg/dL — CL (ref 8.9–10.3)
Chloride: 115 mmol/L — ABNORMAL HIGH (ref 98–111)
Creatinine, Ser: 2.56 mg/dL — ABNORMAL HIGH (ref 0.61–1.24)
GFR calc Af Amer: 27 mL/min — ABNORMAL LOW (ref >60)
GFR calc non Af Amer: 24 mL/min — ABNORMAL LOW (ref >60)
Glucose, Bld: 252 mg/dL — ABNORMAL HIGH (ref 70–99)
Potassium: 3.8 mmol/L (ref 3.5–5.1)
Sodium: 149 mmol/L — ABNORMAL HIGH (ref 135–145)

## 2020-05-16 LAB — TRIGLYCERIDES: Triglycerides: 191 mg/dL — ABNORMAL HIGH (ref <150)

## 2020-05-16 MED ORDER — LACTATED RINGERS IV SOLN: INTRAVENOUS | Status: AC

## 2020-05-16 MED ORDER — ACETAMINOPHEN 10 MG/ML IV SOLN
1000.0000 mg | Freq: Four times a day (QID) | INTRAVENOUS | Status: AC | PRN
  Administered 2020-05-16 (×2): 1000 mg via INTRAVENOUS
  Filled 2020-05-16 (×2): qty 100

## 2020-05-16 MED ORDER — CALCIUM GLUCONATE-NACL 1-0.675 GM/50ML-% IV SOLN
1.0000 g | Freq: Once | INTRAVENOUS | Status: AC
  Administered 2020-05-16: 1000 mg via INTRAVENOUS
  Filled 2020-05-16: qty 50

## 2020-05-16 MED ORDER — TRACE MINERALS CU-MN-SE-ZN 300-55-60-3000 MCG/ML IV SOLN
INTRAVENOUS | Status: DC
  Filled 2020-05-16: qty 403.2

## 2020-05-16 MED ORDER — FLUCONAZOLE IN SODIUM CHLORIDE 200-0.9 MG/100ML-% IV SOLN
200.0000 mg | INTRAVENOUS | Status: DC
  Administered 2020-05-16 – 2020-05-19 (×4): 200 mg via INTRAVENOUS
  Filled 2020-05-16 (×4): qty 100

## 2020-05-16 MED ORDER — DEXMEDETOMIDINE HCL IN NACL 400 MCG/100ML IV SOLN
0.0000 ug/kg/h | INTRAVENOUS | Status: DC
  Administered 2020-05-16: 0.4 ug/kg/h via INTRAVENOUS
  Administered 2020-05-17: 0.6 ug/kg/h via INTRAVENOUS
  Administered 2020-05-17: 1 ug/kg/h via INTRAVENOUS
  Administered 2020-05-17: 0.6 ug/kg/h via INTRAVENOUS
  Administered 2020-05-17: 0.8 ug/kg/h via INTRAVENOUS
  Administered 2020-05-17: 0.2 ug/kg/h via INTRAVENOUS
  Administered 2020-05-18: 0.9 ug/kg/h via INTRAVENOUS
  Administered 2020-05-18: 0.8 ug/kg/h via INTRAVENOUS
  Administered 2020-05-18: 0.9 ug/kg/h via INTRAVENOUS
  Filled 2020-05-16 (×3): qty 100
  Filled 2020-05-16: qty 200
  Filled 2020-05-16 (×3): qty 100

## 2020-05-16 MED ORDER — HEPARIN SODIUM (PORCINE) 5000 UNIT/ML IJ SOLN
5000.0000 [IU] | Freq: Three times a day (TID) | INTRAMUSCULAR | Status: DC
  Administered 2020-05-16 – 2020-05-22 (×17): 5000 [IU] via SUBCUTANEOUS
  Filled 2020-05-16 (×18): qty 1

## 2020-05-16 NOTE — Progress Notes (Signed)
1 Day Post-Op     Subjective/Chief Complaint: Assuming this patient's general surgical care from Dr. Claria Dice.  I have reviewed the operative notes from the weekend.  Patient remains intubated, but is off all pressors. Sedated with Fentanyl/ Propofol Mild tachycardia, low grade temp  G-tube - bilious output   Objective: Vital signs in last 24 hours: Temp:  [99 F (37.2 C)-101.7 F (38.7 C)] 100 F (37.8 C) (05/24 0800) Pulse Rate:  [53-137] 110 (05/24 0805) Resp:  [16-32] 30 (05/24 0703) BP: (92-144)/(35-126) 112/57 (05/24 0805) SpO2:  [82 %-100 %] 100 % (05/24 0703) Arterial Line BP: (82-175)/(48-67) 150/65 (05/24 0703) FiO2 (%):  [40 %-80 %] 40 % (05/24 0806) Weight:  [361 kg] 112 kg (05/24 0500) Last BM Date: 05/11/2020  Intake/Output from previous day: 05/23 0701 - 05/24 0700 In: 4999.5 [I.V.:3356.5; IV Piggyback:1643] Out: 2346 [Urine:1021; Drains:1165; Blood:50; Chest Tube:110] Intake/Output this shift: No intake/output data recorded.  Elderly male - intubated but arousable Nasoesophageal tube - in place with adequate suction Gastrostomy tube - greenish drainage Abd - quite, AbThera vac with good seal JP drain - serosanguinous drainage  Lab Results:  Recent Labs    2020-06-08 1700 05/16/20 0530  WBC 17.1* 19.2*  HGB 9.7* 8.6*  HCT 31.7* 27.4*  PLT 468* 452*   BMET Recent Labs    06-08-2020 0417 08-Jun-2020 0449 06/08/2020 1417 05/16/20 0530  NA 148*   < > 149* 149*  K 3.7   < > 3.6 3.8  CL 110  --   --  115*  CO2 26  --   --  21*  GLUCOSE 167*  --   --  252*  BUN 17  --   --  43*  CREATININE 1.41*  --   --  2.56*  CALCIUM 7.4*  --   --  6.4*   < > = values in this interval not displayed.   PT/INR No results for input(s): LABPROT, INR in the last 72 hours. ABG Recent Labs    06-08-2020 1319 2020-06-08 1417  PHART 7.366 7.373  HCO3 26.7 26.4    Studies/Results: CT ABDOMEN PELVIS WO CONTRAST  Result Date: 2020-06-08 CLINICAL DATA:   Abdominal pain/distension, status post subtotal gastrectomy for gastric necrosis on 05/20 EXAM: CT ABDOMEN AND PELVIS WITHOUT CONTRAST TECHNIQUE: Multidetector CT imaging of the abdomen and pelvis was performed following the standard protocol without IV contrast. COMPARISON:  05/11/2020 FINDINGS: Motion degraded images. Lower chest: Moderate bilateral pleural effusions, left greater than right. Associated lower lobe atelectasis. Hepatobiliary: Unenhanced liver is unremarkable. Layering small gallstones (series 3/image 41), without associated inflammatory changes. Pancreas: Within normal limits. Spleen: Within normal limits. Adrenals/Urinary Tract: Right adrenal gland is within normal limits. Stable 3.3 cm left adrenal mass (series 3/image 32), previously characterized as a benign adrenal adenoma. Kidneys are within normal limits.  No hydronephrosis. Bladder decompressed by indwelling Foley catheter. Mildly thick-walled with nondependent gas. Stomach/Bowel: Enteric tube terminates in the distal esophagus (series 3/image 24). Status post subtotal gastrectomy. Patient remains in discontinuity. Residual stomach is reportedly present but poorly visualized. Percutaneous (gastro)jejunostomy is present in the anterior mid abdomen (series 3/image 45), and the enteric tube terminates in the 2nd/3rd portion of the duodenum (series 3/image 48). Gas and fluid in the left upper abdomen (series 3/image 18), without a well-defined collection at the current time. Additional scattered free air and moderate pneumoperitoneum beneath the anterior abdominal wall (series 3/image 34). While there is an adjacent suture line related to the reported  gastrojejunostomy, the pneumoperitoneum itself is nondependent. Overall, these findings are within the upper range of normal for recent surgery, particularly given gastric necrosis, and do not by themselves indicate anastomotic breakdown/leak. Visualized bowel is otherwise unremarkable, including  a normal appendix (series 3/image 84). Vascular/Lymphatic: No evidence of abdominal aortic aneurysm. Atherosclerotic calcifications of the abdominal aorta and branch vessels. No suspicious abdominopelvic lymphadenopathy. Reproductive: Marked prostatomegaly, suggesting BPH. Other: Moderate pneumoperitoneum, as described above. Gas and fluid in the left upper abdomen, as described above. Surgical drain terminating in the portacaval region (series 3/image 32). Additional small volume abdominal ascites. Musculoskeletal: Degenerative changes of the visualized thoracolumbar spine. IMPRESSION: Postsurgical changes related to subtotal gastrectomy. Discontinuous residual stomach with percutaneous gastrojejunostomy terminating in the mid duodenum. Associated gas and fluid in the left upper abdomen, without well-defined collection at the current time. Scattered free air and moderate pneumoperitoneum. Additional small volume abdominal ascites. Overall, these findings are considered within the upper range of normal for the recent surgery. Surgical drain terminating in the portacaval region. These results were called by telephone at the time of interpretation on 2020-05-30 at 7:16 am to provider Dr Romana Juniper, who verbally acknowledged these results. Electronically Signed   By: Julian Hy M.D.   On: 05-30-20 07:18   DG Abd 1 View - KUB  Result Date: 05/14/2020 CLINICAL DATA:  Abdominal pain. Exam was ordered to rule out pneumoperitoneum. EXAM: ABDOMEN - 1 VIEW COMPARISON:  May 11, 2020 FINDINGS: Limited view of the abdomen in supine position, with exclusion of the hemidiaphragms demonstrates nonobstructive bowel gas pattern. Paucity of gas is noted. The tip of probable enteric catheter overlies the lower mid thorax. Jejunostomy tube is seen it in the upper abdomen. IMPRESSION: 1. Limited view of the abdomen in supine position, with exclusion of the hemidiaphragms demonstrates nonobstructive bowel gas pattern. 2.  The tip of probable enteric catheter overlies the lower mid thorax. 3. No frank pneumoperitoneum is seen, however this is a very limited view of the abdomen. If pneumoperitoneum is considered clinically, decubital or semi upright (if possible) view of the abdomen may be considered. Electronically Signed   By: Fidela Salisbury M.D.   On: 05/14/2020 12:18   DG CHEST PORT 1 VIEW  Result Date: 2020-05-30 CLINICAL DATA:  Evaluate central line placement EXAM: PORTABLE CHEST 1 VIEW COMPARISON:  05-30-20 FINDINGS: The ETT terminates in good position. The NG tube terminates in the esophagus, unchanged. Two left chest tubes are identified with no left-sided pneumothorax. A small left effusion remains. Skin staples are seen over the lower lateral chest. A new left central line terminates near the brachiocephalic confluence. The cardiomediastinal silhouette is stable. No other interval changes. IMPRESSION: 1. The distal tip of the new left central line is near or just below the brachiocephalic confluence without pneumothorax. 2. Other support apparatus as above. 3. No other significant interval changes. Electronically Signed   By: Dorise Bullion III M.D   On: 30-May-2020 16:35   DG CHEST PORT 1 VIEW  Result Date: May 30, 2020 CLINICAL DATA:  Initial evaluation for tachypnea. EXAM: PORTABLE CHEST 1 VIEW COMPARISON:  Prior radiograph from 07/13/2017. FINDINGS: Enteric tube in place with tip position near the GE junction, side hole in the distal esophagus. Cardiomegaly. Mediastinal silhouette within normal limits. Lungs hypoinflated. Perihilar vascular congestion with scattered bilateral airspace opacities, favored to in large part reflect edema/congestion. Superimposed left pleural effusion. Dense left basilar opacity could reflect atelectasis or infiltrate. No pneumothorax. Probable free air underneath the right  hemidiaphragm, consistent with history of recent laparotomy. No acute osseous abnormality. Degenerative  changes noted about the right shoulder. IMPRESSION: 1. Enteric tube in place with tip near the GE junction, side hole in the distal esophagus. 2. Cardiomegaly with perihilar vascular congestion and scattered bilateral airspace opacities, favored to reflect pulmonary congestion/edema. 3. Superimposed left pleural effusion. Associated dense left basilar opacity, which could reflect atelectasis or infiltrate. 4. Suspected free air subjacent to the right hemidiaphragm, likely related to recent laparotomy. If there is clinical concern for an underlying perforated viscus, then further assessment with dedicated cross-sectional imaging of the abdomen and pelvis would be warranted. Electronically Signed   By: Rise Mu M.D.   On: 05/09/2020 05:03   DG Abd Portable 1V  Result Date: 05/08/2020 CLINICAL DATA:  Initial evaluation for acute abdominal pain, evaluate for perforation. EXAM: PORTABLE ABDOMEN - 1 VIEW COMPARISON:  Prior radiograph from 05/14/2020. FINDINGS: Enteric tube in place with tip position near the GE junction, side hole in the distal esophagus. Surgical drain and skin staples overlie the left abdomen. Visualized bowel gas pattern is nonobstructive. Vague gas lucency seen subjacent to the right hemidiaphragm, suspicious for possible free air. While this could be related to recent laparotomy, possible perforated viscus could also be considered. No acute osseous finding. IMPRESSION: 1. Vague gas lucency subjacent to the right hemidiaphragm, suspicious for possible free air. While this could be related to recent laparotomy, possible perforated viscus could also be considered. Further assessed with dedicated cross-sectional imaging of the abdomen and pelvis recommended as clinically warranted. 2. Enteric tube in place with tip near the GE junction, side hole in the distal esophagus. 3. Nonobstructive bowel gas pattern. Current attempt is being made to convey these results to the clinician. Results  will be communicated as soon as possible. Electronically Signed   By: Rise Mu M.D.   On: 05/13/2020 05:06    Anti-infectives: Anti-infectives (From admission, onward)   Start     Dose/Rate Route Frequency Ordered Stop   05/16/20 2000  fluconazole (DIFLUCAN) IVPB 400 mg     400 mg 100 mL/hr over 120 Minutes Intravenous Every 24 hours 05/09/2020 1936     05/19/2020 2000  fluconazole (DIFLUCAN) IVPB 800 mg     800 mg 200 mL/hr over 120 Minutes Intravenous  Once 05/02/2020 1936 05/02/2020 2339   05/09/2020 1945  fluconazole (DIFLUCAN) IVPB 100 mg  Status:  Discontinued    Note to Pharmacy: Pharmacy may change dose as needed  For intraabdominal infection/ florid leak from esophagogastric junction   100 mg 50 mL/hr over 60 Minutes Intravenous Every 24 hours 05/21/2020 1931 04/28/2020 1936   05/21/2020 0600  piperacillin-tazobactam (ZOSYN) IVPB 3.375 g     3.375 g 12.5 mL/hr over 240 Minutes Intravenous Every 8 hours 05/10/2020 0544     05/10/20 0600  ceFAZolin (ANCEF) IVPB 2g/100 mL premix  Status:  Discontinued     2 g 200 mL/hr over 30 Minutes Intravenous Every 8 hours 05/09/20 1542 05/03/2020 0544   05/07/20 1000  cefTRIAXone (ROCEPHIN) 2 g in sodium chloride 0.9 % 100 mL IVPB  Status:  Discontinued     2 g 200 mL/hr over 30 Minutes Intravenous Every 24 hours 04/29/2020 1036 05/09/20 1542   05/22/2020 0745  cefTRIAXone (ROCEPHIN) 2 g in sodium chloride 0.9 % 100 mL IVPB     2 g 200 mL/hr over 30 Minutes Intravenous  Once 04/27/2020 0730 05/18/2020 0955      Assessment/Plan: DM2  Adrenal  incidentaloma Urinary retention enlarged prostate Klebsiella bacteremia  Acute hypoxic respiratory failure- resolved Gastric necrosis, gastric volvulus   S/p partial gastrectomy, gastrojejunostomy tube placement - Dr. Bedelia Person 5/21 S/p exp lap/ left thoracotomy/ resection of GE junction and distal esophagus for ongoing necrosis - Dr. Lenell Antu 05/22/2020 Ab-Thera VAC placement 05/02/2020  -Hemodynamically  improved, WBC increased - NO MEDS per G or J tube.  - IV tylenol, IV robaxin, HM PCA for pain.  - Will need abdominal washout, possible fascial closure tomorrow.  Will also revise gastrojejunostomy tube to provide reliable long-term enteral access. -TCTS to determine whether he needs repeat thoracotomy to examine remaining esophagus.  Will discuss further with TCTS.  Patient may need a spit fistula and may need to be left in discontinuity until he is clinically improved.  Then, he will need reconstruction with colon interposition vs. Roux-en-Y esophagojejunostomy  FEN: NPO, IVF ID: [id number]  VTE: SCD's, SQH Foley: in place 2/2 prostatism with urosepsis    LOS: 10 days    Wynona Luna 05/16/2020

## 2020-05-16 NOTE — Progress Notes (Signed)
Spoke with primary RN Amy re: Nutrition/TNA, will not be hung today due to central line tip not in the recommended area. Still awaiting phone call/response from MD regarding PICC placement.

## 2020-05-16 NOTE — Progress Notes (Signed)
PHARMACY - TOTAL PARENTERAL NUTRITION CONSULT NOTE   Indication: bowel necrosis  Patient Measurements: Height: 5\' 10"  (177.8 cm) Weight: 112 kg (246 lb 14.6 oz) IBW/kg (Calculated) : 73 TPN AdjBW (KG): 84.2 Body mass index is 35.43 kg/m.  Assessment: 66 yom originally presenting 5/14 with abdominal pain, urinary retention due to worsening symptoms of prostatism and elevated PSA. Symptoms improved s/p Foley. Klebsiella bacteremia treated with ceftriaxone. Patient transitioned to orals and was to be discharged with Foley in place for outpatient Urology evaluation; however developed sudden abdominal pain 5/19 with tachypnea and desaturation, EGD showed necrosis of 1/3 stomach due to gastric volvulus. Now s/p OR 5/20 for subtotal gastrectomy with placement of jejunostomy tube. Patient then underwent thoracotomy and more proximal esophagectomy. Plan back to OR 5/25. May also need further TCTS procedure. Holding TF. Pharmacy consulted to start TPN.  Patient currently intubated and sedated (fentanyl drip + CCM to change propofol to Precedex) in ICU with open abdomen, off pressors as of 5/24 AM.  Glucose / Insulin: hx DM2, A1c 7.6. CBGs 118-252 on sSSI q4h - utilized 11 units in last 24 hrs. Noted, s/p decadron 10mg  IV x 1 on 5/23 Electrolytes: K 3.8 (s/p Lasix 40mg  IV x 1 yesterday); Na 149 stable, Cl 115 / CO2 21, corrected Ca ~8.3 (already given Ca gluconate 1g IV x 1 this AM); others WNL Renal: AKI - SCr back up to 2.56 (1.06 on 5/18), BUN up to 43; +5.6L this admit LFTs / TGs: LFTs / Tbili WNL (last 5/22, noted on fluconazole), TG 191 (on propofol, now d/c'd 5/24) Prealbumin / albumin: albumin 1.6 (last 5/22) Intake / Output; MIVF: G-tube with 940 ml/24 hrs bilious output; LBM 5/20; UOP 0.4 ml/kg/hr; LR at 75 ml/hr ordered GI Imaging: 5/19 CT abd - stomach distended by air, fluid, ingested contrast with unusual dependent air loculations of gastric fundus, high-grade gastric outlet obstruction, new  bilateral pleural effusions 5/23 CT abd - gas and free fluid in LU abd, scattered free air and moderate pneumoperitoneum, small volume ascites; post-surgical changes at upper range of normal for recent surgery Surgeries / Procedures: 5/21 partial gastrectomy, gastrojejunostomy tube placement 5/23 ex-lap, L thoracotomy/resection of GE junction and distal esophagus for ongoing necrosis, ab-thera vac placement 5/25 abd washout, possible fascial closure, revision of gastrojejunostomy tube  Central access: double lumen CVC, planning PICC 5/24 TPN start date: 05/16/20  Nutritional Goals (per RD recommendation on 5/21): kCal: 2300-2500, Protein: 115-135g, Fluid: >2L Goal TPN (concentrated) rate is 75 mL/hr (provides 130 g of protein and 2304 kcals per day)  Current Nutrition:  NPO  Plan:  Start concentrated TPN at 35 mL/hr at 1800 -TPN will provide 60g protein, 151g CHO, 32g SMOF lipids, for 1076 total kCal, meeting 46% of patient needs. Electrolytes in TPN: reduce to 61mEq/L of Na, reduce to 61mEq/L of K and 45mmol/L of Phos with AKI, standard 32mEq/L of Ca and 42mEq/L of Mg; Cl:Ac ratio 1:2 Add standard MVI and trace elements to TPN. May need to remove chromium/selenium based on renal function trend with AKI Continue Sensitive q4h SSI and adjust as needed  Reduce LR to 40 mL/hr at 1800 when TPN bag hung Monitor TPN labs on Mon/Thurs, f/u Surgery/TCTS plans   9m, PharmD, BCPS Please check AMION for all [facility] contact numbers Clinical Pharmacist 05/16/2020 9:58 AM

## 2020-05-16 NOTE — Progress Notes (Signed)
eLink Physician-Brief Progress Note Patient Name: Ethan Johnson DOB: Feb 06, 1946 MRN: [id number]   Date of Service  05/16/2020  HPI/Events of Note  Calcium 6.4 Albumin 1.6 a couple days ago, corrected calcium around 8.3  eICU Interventions  1 gram IV calcium gluconate x 1 Would repeat levels today and decide further      Intervention Category Intermediate Interventions: Electrolyte abnormality - evaluation and management  Oretha Milch 05/16/2020, 6:48 AM

## 2020-05-16 NOTE — Progress Notes (Signed)
NAME:  Ethan Johnson, MRN:  [id number], DOB:  [birth year], LOS: 10 ADMISSION DATE:  2020/05/24, CONSULTATION DATE:  05/11/2020 REFERRING MD:  Janee Morn - Trauma, CHIEF COMPLAINT:  Gastric infarction.   HPI/course in hospital  74 year old man who originally presented 6 days ago with abdominal pain and was found to have urinary retention in the context of worsening symptoms of prostatism and an elevated PSA. Symptoms improved following insertion of Foley and drainage of 1.5L of urine.  SVT at that time which was felt to be sinus tachycardia as no conversion with adenosine. Hypotension and tachycardia improved initially with relief of obstruction.   Found to have Klebsiella bacteremia which was treated with ceftriaxone. Transitioned to orals and was to be discharged with Foley in place for outpatient urology evaluation.  Noted to have gastric distension either due to outlet obstruction or gastroparesis, exacerbated by acute illness.   5/19 developed sudden abdominal pain with tachypnea and desaturation. Loose dark stools.   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   5/20 OR  -  Subtotal gastrectomy with placement of jejunostomy tube. Normotensive throughout the case, minimal blood loss, fluids given.  Returned to ICU intubated.  Past Medical History   Past Medical History:  Diagnosis Date  . Adrenal incidentaloma (HCC)   . Aortic atherosclerosis (HCC)   . BPH (benign prostatic hyperplasia)   . Diabetes mellitus without complication (HCC)   . Hepatic steatosis   . Neuropathy      Past Surgical History:  Procedure Laterality Date  . ESOPHAGOGASTRODUODENOSCOPY (EGD) WITH PROPOFOL N/A 04/27/2020   Procedure: ESOPHAGOGASTRODUODENOSCOPY (EGD) WITH PROPOFOL;  Surgeon: Rachael Fee, MD;  Location: [facility] ENDOSCOPY;  Service: Endoscopy;  Laterality: N/A;  . LAPAROTOMY N/A 05/08/2020   Procedure: SUBTOTAL GASTRECTOMY, JEJUNOSTOMY TUBE;  Surgeon: Diamantina Monks, MD;   Location: [facility] OR;  Service: General;  Laterality: N/A;     Interim history/subjective:   Purulent ascites was drained from the abdomen.:  Small bowel all up.  Viable.  Gastrojejunostomy was in good position.  Source was felt to be dehiscence of the proximal staple line at the level of the esophagus.  Patient then underwent thoracotomy and more proximal esophagectomy.  Plan to take patient back tomorrow for further washout.   Objective   Blood pressure (!) 112/57, pulse (!) 110, temperature 100 F (37.8 C), temperature source Axillary, resp. rate (!) 30, height 5\' 10"  (1.778 m), weight 112 kg, SpO2 100 %. CVP:  [12 mmHg-14 mmHg] 14 mmHg  Vent Mode: PRVC FiO2 (%):  [40 %-80 %] 40 % Set Rate:  [16 bmp] 16 bmp Vt Set:  [580 mL] 580 mL PEEP:  [5 cmH20] 5 cmH20 Plateau Pressure:  [18 cmH20-22 cmH20] 21 cmH20   Intake/Output Summary (Last 24 hours) at 05/16/2020 0855 Last data filed at 05/16/2020 0700 Gross per 24 hour  Intake 4927.22 ml  Output 2346 ml  Net 2581.22 ml   Filed Weights   05/03/2020 1154 05/10/2020 0500 05/16/20 0500  Weight: 118 kg 108.5 kg 112 kg    Examination: Physical Exam Constitutional:      Appearance: He is obese. He is ill-appearing.     Interventions: He is sedated and intubated.  HENT:     Head:     Comments: NGT tube in place with minimal bloody drainage.      Mouth/Throat:     Mouth: Mucous membranes are moist.  Eyes:     General: No  scleral icterus. Neck:     Trachea: Trachea normal.     Comments: Orally intubated Cardiovascular:     Rate and Rhythm: Regular rhythm. Tachycardia present.     Heart sounds: Normal heart sounds.  Pulmonary:     Effort: Pulmonary effort is normal. He is intubated.     Breath sounds: Normal breath sounds and air entry.     Comments: Left chest tube with air leak.  Abdominal:     General: A surgical scar is present. Bowel sounds are decreased.     Tenderness: There is no abdominal tenderness.       Comments: Clean  mid line incision. Jejunostomy RUQ. LUQ JP with  minimal bloody drainage age.  Genitourinary:    Comments: Foley in place. Neurological:     Mental Status: He is oriented to person, place, and time. He is lethargic.     Comments: Follows simple commands weakly.      Ancillary tests (personally reviewed)  CBC: Recent Labs  Lab 05/13/20 0820 05/13/20 0820 05/14/20 0951 05/14/20 0951 05/13/2020 0417 05/21/2020 0449 04/30/2020 1247 04/23/2020 1319 04/23/2020 1417 05/20/2020 1700 05/16/20 0530  WBC 28.8*  --  32.6*  --  20.9*  --   --   --   --  17.1* 19.2*  NEUTROABS  --   --  22.5*  --  15.1*  --   --   --   --   --  11.8*  HGB 9.6*   < > 9.5*   < > 11.1*   < > 9.2* 9.5* 9.2* 9.7* 8.6*  HCT 30.2*   < > 29.9*   < > 35.0*   < > 27.0* 28.0* 27.0* 31.7* 27.4*  MCV 83.2  --  85.2  --  84.7  --   --   --   --  86.1 84.0  PLT 290  --  341  --  400  --   --   --   --  468* 452*   < > = values in this interval not displayed.    Basic Metabolic Panel: Recent Labs  Lab 05/11/20 0331 05/11/20 0331 05/14/2020 0629 05/18/2020 0629 05/11/2020 1407 04/27/2020 1757 05/13/20 0820 05/13/20 1818 05/14/20 0951 05/14/20 0951 05/14/20 1612 05/14/2020 0417 05/04/2020 0449 05/11/2020 1102 05/22/2020 1247 05/22/2020 1319 04/28/2020 1417 05/16/20 0530  NA 139   < > 138   < > 141   < >  --   --  147*   < >  --  148*   < > 150* 150* 150* 149* 149*  K 3.9   < > 4.4   < > 3.2*   < >  --   --  3.8   < >  --  3.7   < > 3.8 3.7 3.7 3.6 3.8  CL 99   < > 103  --  102  --   --   --  109  --   --  110  --   --   --   --   --  115*  CO2 23  --  22  --   --   --   --   --  25  --   --  26  --   --   --   --   --  21*  GLUCOSE 159*   < > 147*  --  135*  --   --   --  210*  --   --  167*  --   --   --   --   --  252*  BUN 18   < > 16  --  15  --   --   --  16  --   --  17  --   --   --   --   --  43*  CREATININE 1.06   < > 1.24  --  1.10  --   --   --  1.29*  --   --  1.41*  --   --   --   --   --  2.56*  CALCIUM 8.0*  --  7.8*  --    --   --   --   --  7.3*  --   --  7.4*  --   --   --   --   --  6.4*  MG  --   --   --   --   --   --  1.7 1.5* 1.8  --  1.7 1.9  --   --   --   --   --   --   PHOS  --   --   --   --   --   --   --  3.4 2.8  --  2.6 2.7  --   --   --   --   --   --    < > = values in this interval not displayed.   GFR: Estimated Creatinine Clearance: 31.7 mL/min (A) (by C-G formula based on SCr of 2.56 mg/dL (H)). Recent Labs  Lab 05/11/20 1846 05/11/20 2122 04/26/2020 0629 05/14/20 0951 05/18/2020 0417 05-18-20 1700 05/16/20 0530  WBC  --   --    < > 32.6* 20.9* 17.1* 19.2*  LATICACIDVEN 1.4 1.4  --  1.8 3.4*  --   --    < > = values in this interval not displayed.    Liver Function Tests: Recent Labs  Lab 05/14/20 0951  AST 21  ALT 15  ALKPHOS 72  BILITOT 1.1  PROT 5.3*  ALBUMIN 1.6*   No results for input(s): LIPASE, AMYLASE in the last 168 hours. No results for input(s): AMMONIA in the last 168 hours.  ABG    Component Value Date/Time   PHART 7.373 05/18/2020 1417   PCO2ART 45.4 2020-05-18 1417   PO2ART 321 (H) 05-18-2020 1417   HCO3 26.4 2020-05-18 1417   TCO2 28 2020/05/18 1417   ACIDBASEDEF 0.4 05/13/2020 0825   O2SAT 100.0 05/18/20 1417     Coagulation Profile: No results for input(s): INR, PROTIME in the last 168 hours.  Cardiac Enzymes: No results for input(s): CKTOTAL, CKMB, CKMBINDEX, TROPONINI in the last 168 hours.  HbA1C: Hgb A1c MFr Bld  Date/Time Value Ref Range Status  05/05/2020 09:44 AM 7.6 (H) 4.8 - 5.6 % Final    Comment:    (NOTE) Pre diabetes:          5.7%-6.4% Diabetes:              >6.4% Glycemic control for   <7.0% adults with diabetes     CBG: Recent Labs  Lab 2020-05-18 1510 05/18/2020 1943 May 18, 2020 2314 05/16/20 0311 05/16/20 0739  GLUCAP 118* 169* 171* 221* 228*    Assessment & Plan:   Remains critically ill due to expected acute hypoxic respiratory failure following partial gastrectomy. - Analgesia for upper abdominal  incision.  - At increased  risk of post-operative pulmonary complications given body habitus and location of incision.  -Continue full ventilatory support  Critically ill due to hypovolemic and distributive shock requiring titration of vasopressors and fluid administration. Now resolved. - follow CVP  AKI from critical illness - appears euvolemic as pressor requirement have resolved. Abdomen still, open so compartment syndrome unlikely.  - follow creatinine - no indications for acute dialysis.  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 New abdominal pain with possible leakage from PEG site. - No manipulation of NGT -Hold feeds. -Investigate possible PEG dysfunction per surgery.  Urinary retention due to BPH - Leave Foley in place.  Daily Goals Checklist  Pain/Anxiety/Delirium protocol (if indicated): Fentanyl and propofol infusions.  Switch to dexmedetomidine in anticipation of TPN VAP protocol (if indicated): Bundle in place Respiratory support goals: Full ventilatory support Blood pressure target: Titrate norepinephrine to keep MAP greater than 65.  Follow CVP, fluid bolus for CVP less than 4. DVT prophylaxis: Heparin TID Nutritional status and feeding goals: moderate risk, feeds off due to tube dysfunction..  Start TPN GI prophylaxis: Not required Fluid status goals: Ongoing fluid resuscitation Urinary catheter: urinary retention. Central lines: Arterial line, R IJ CVC will need PICC for TPN Glucose control: well controlled on SSI. T2DM with HbA1C 7.6 Mobility/therapy needs: Mobilize to chair as tolerated. Antibiotic de-escalation: perioperative antibiotics only. Home medication reconciliation: on hold Daily labs: CBC, BMET Code Status: full  Family Communication: Family updated per surgery today Disposition: ICU   CRITICAL CARE Performed by: Lynnell Catalan   Total critical care time: 40 minutes  Critical  care time was exclusive of separately billable procedures and treating other patients.  Critical care was necessary to treat or prevent imminent or life-threatening deterioration.  Critical care was time spent personally by me on the following activities: development of treatment plan with patient and/or surrogate as well as nursing, discussions with consultants, evaluation of patient's response to treatment, examination of patient, obtaining history from patient or surrogate, ordering and performing treatments and interventions, ordering and review of laboratory studies, ordering and review of radiographic studies, pulse oximetry, re-evaluation of patient's condition and participation in multidisciplinary rounds.  Lynnell Catalan, MD [facility] ICU Physician [facility] [place] [facility]  Pager: [phone] Mobile: [phone] After hours: [phone].      05/16/2020, 8:55 AM

## 2020-05-16 NOTE — Progress Notes (Signed)
CRITICAL VALUE ALERT  Critical Value:  Calcium 6.4  Date & Time Notied:  05/16/2020 0620  Provider Notified: Dr. Roxan Hockey of [facility]  Orders Received/Actions taken: Physician notified. New orders for calcium gluconate 1 gram IV.

## 2020-05-16 NOTE — Progress Notes (Signed)
Nutrition Follow-up  DOCUMENTATION CODES:   Obesity unspecified  INTERVENTION:   TNA to advance and provide nutrition needs during bowel rest   NUTRITION DIAGNOSIS:   Increased nutrient needs related to post-op healing as evidenced by estimated needs. Ongoing.   GOAL:   Patient will meet greater than or equal to 90% of their needs Progressing.   MONITOR:   TF tolerance  REASON FOR ASSESSMENT:   Consult Enteral/tube feeding initiation and management  ASSESSMENT:   Pt with PMH of DM, aortic atherosclerosis, BPH, hepatic steatosis, neuropathy, and adrenal incidentaloma admitted with abd pain x 1 week PTA, bladder outlet obstruction, sepsis, uremia/AKI from massively dilated prostate.   Pt discussed during ICU rounds and with RN.  Spoke with wife at bedside (she is a previous dietitian at the health department)  5/20 s/p partial gastrectomy, gastrojejunostomy tube placement with nasoesophageal tube, g-tube to LIWS, and J-tube.   5/23 ex-lap, L thoracotomy/resection of GE junction and distal esophagus for ongoing necrosis, ab-thera vac placement -- abdomen left open with further plans for surgery as able 5/24 TNA initiated due to prolonged bowel rest  Patient is currently intubated on ventilator support MV: 15.4 L/min Temp (24hrs), Avg:100.6 F (38.1 C), Min:99 F (37.2 C), Max:101.7 F (38.7 C)   Medications reviewed and include: SSI Labs reviewed: Na 149 (H), Ca 6.4 CBG's: 228-222   TNA - plan to start @ 35 ml/h and provide: 1076 kcal, 60 grams protein, and 32 grams SMOF lipids  Diet Order:   Diet Order            Diet NPO time specified  Diet effective now              EDUCATION NEEDS:   No education needs have been identified at this time  Skin:  Skin Assessment: Skin Integrity Issues: Skin Integrity Issues:: Incisions  Last BM:  5/19  Height:   Ht Readings from Last 1 Encounters:  05/10/2020 5\' 10"  (1.778 m)    Weight:   Wt Readings  from Last 1 Encounters:  05/16/20 112 kg    Ideal Body Weight:  75.4 kg  BMI:  Body mass index is 35.43 kg/m.  Estimated Nutritional Needs:   Kcal:  2500  Protein:  150-170 grams  Fluid:  >2 L/day  05/18/20., RD, LDN, CNSC See AMiON for contact information '

## 2020-05-17 ENCOUNTER — Inpatient Hospital Stay (HOSPITAL_COMMUNITY): Payer: No Typology Code available for payment source | Admitting: Certified Registered"

## 2020-05-17 ENCOUNTER — Encounter (HOSPITAL_COMMUNITY): Admission: EM | Disposition: E | Payer: Self-pay | Source: Home / Self Care | Attending: Pulmonary Disease

## 2020-05-17 ENCOUNTER — Encounter (HOSPITAL_COMMUNITY): Payer: Self-pay | Admitting: Internal Medicine

## 2020-05-17 DIAGNOSIS — J9601 Acute respiratory failure with hypoxia: Secondary | ICD-10-CM

## 2020-05-17 HISTORY — PX: GASTROSTOMY: SHX5249

## 2020-05-17 HISTORY — PX: LAPAROTOMY: SHX154

## 2020-05-17 LAB — COMPREHENSIVE METABOLIC PANEL
ALT: 42 U/L (ref 0–44)
AST: 146 U/L — ABNORMAL HIGH (ref 15–41)
Albumin: 1 g/dL — ABNORMAL LOW (ref 3.5–5.0)
Alkaline Phosphatase: 62 U/L (ref 38–126)
Anion gap: 13 (ref 5–15)
BUN: 74 mg/dL — ABNORMAL HIGH (ref 8–23)
CO2: 22 mmol/L (ref 22–32)
Calcium: 6.3 mg/dL — CL (ref 8.9–10.3)
Chloride: 115 mmol/L — ABNORMAL HIGH (ref 98–111)
Creatinine, Ser: 3.53 mg/dL — ABNORMAL HIGH (ref 0.61–1.24)
GFR calc Af Amer: 19 mL/min — ABNORMAL LOW (ref >60)
GFR calc non Af Amer: 16 mL/min — ABNORMAL LOW (ref >60)
Glucose, Bld: 275 mg/dL — ABNORMAL HIGH (ref 70–99)
Potassium: 4.2 mmol/L (ref 3.5–5.1)
Sodium: 150 mmol/L — ABNORMAL HIGH (ref 135–145)
Total Bilirubin: 1.4 mg/dL — ABNORMAL HIGH (ref 0.3–1.2)
Total Protein: 5.1 g/dL — ABNORMAL LOW (ref 6.5–8.1)

## 2020-05-17 LAB — CBC WITH DIFFERENTIAL/PLATELET
Abs Immature Granulocytes: 0 10*3/uL (ref 0.00–0.07)
Band Neutrophils: 1 %
Basophils Absolute: 0 10*3/uL (ref 0.0–0.1)
Basophils Relative: 0 %
Eosinophils Absolute: 0 10*3/uL (ref 0.0–0.5)
Eosinophils Relative: 0 %
HCT: 22.5 % — ABNORMAL LOW (ref 39.0–52.0)
Hemoglobin: 7.2 g/dL — ABNORMAL LOW (ref 13.0–17.0)
Lymphocytes Relative: 49 %
Lymphs Abs: 9 10*3/uL — ABNORMAL HIGH (ref 0.7–4.0)
MCH: 26.5 pg (ref 26.0–34.0)
MCHC: 32 g/dL (ref 30.0–36.0)
MCV: 82.7 fL (ref 80.0–100.0)
Monocytes Absolute: 1.1 10*3/uL — ABNORMAL HIGH (ref 0.1–1.0)
Monocytes Relative: 6 %
Neutro Abs: 8.2 10*3/uL — ABNORMAL HIGH (ref 1.7–7.7)
Neutrophils Relative %: 44 %
Platelets: 399 10*3/uL (ref 150–400)
RBC: 2.72 MIL/uL — ABNORMAL LOW (ref 4.22–5.81)
RDW: 15.9 % — ABNORMAL HIGH (ref 11.5–15.5)
WBC: 18.3 10*3/uL — ABNORMAL HIGH (ref 4.0–10.5)
nRBC: 0.5 % — ABNORMAL HIGH (ref 0.0–0.2)
nRBC: 1 /100 WBC — ABNORMAL HIGH

## 2020-05-17 LAB — CBC
HCT: 27.1 % — ABNORMAL LOW (ref 39.0–52.0)
Hemoglobin: 8.6 g/dL — ABNORMAL LOW (ref 13.0–17.0)
MCH: 26.4 pg (ref 26.0–34.0)
MCHC: 31.7 g/dL (ref 30.0–36.0)
MCV: 83.1 fL (ref 80.0–100.0)
Platelets: 400 10*3/uL (ref 150–400)
RBC: 3.26 MIL/uL — ABNORMAL LOW (ref 4.22–5.81)
RDW: 15.6 % — ABNORMAL HIGH (ref 11.5–15.5)
WBC: 19 10*3/uL — ABNORMAL HIGH (ref 4.0–10.5)
nRBC: 0.7 % — ABNORMAL HIGH (ref 0.0–0.2)

## 2020-05-17 LAB — POCT I-STAT 7, (LYTES, BLD GAS, ICA,H+H)
Acid-Base Excess: 1 mmol/L (ref 0.0–2.0)
Bicarbonate: 25.6 mmol/L (ref 20.0–28.0)
Calcium, Ion: 0.76 mmol/L — CL (ref 1.15–1.40)
HCT: 24 % — ABNORMAL LOW (ref 39.0–52.0)
Hemoglobin: 8.2 g/dL — ABNORMAL LOW (ref 13.0–17.0)
O2 Saturation: 100 %
Patient temperature: 37.9
Potassium: 4.4 mmol/L (ref 3.5–5.1)
Sodium: 151 mmol/L — ABNORMAL HIGH (ref 135–145)
TCO2: 27 mmol/L (ref 22–32)
pCO2 arterial: 41.3 mmHg (ref 32.0–48.0)
pH, Arterial: 7.405 (ref 7.350–7.450)
pO2, Arterial: 347 mmHg — ABNORMAL HIGH (ref 83.0–108.0)

## 2020-05-17 LAB — GLUCOSE, CAPILLARY
Glucose-Capillary: 203 mg/dL — ABNORMAL HIGH (ref 70–99)
Glucose-Capillary: 206 mg/dL — ABNORMAL HIGH (ref 70–99)
Glucose-Capillary: 206 mg/dL — ABNORMAL HIGH (ref 70–99)
Glucose-Capillary: 218 mg/dL — ABNORMAL HIGH (ref 70–99)
Glucose-Capillary: 223 mg/dL — ABNORMAL HIGH (ref 70–99)
Glucose-Capillary: 226 mg/dL — ABNORMAL HIGH (ref 70–99)
Glucose-Capillary: 254 mg/dL — ABNORMAL HIGH (ref 70–99)

## 2020-05-17 LAB — PREPARE RBC (CROSSMATCH)

## 2020-05-17 LAB — SURGICAL PATHOLOGY

## 2020-05-17 LAB — TRIGLYCERIDES: Triglycerides: 163 mg/dL — ABNORMAL HIGH (ref <150)

## 2020-05-17 LAB — MAGNESIUM: Magnesium: 2.1 mg/dL (ref 1.7–2.4)

## 2020-05-17 LAB — PREALBUMIN: Prealbumin: <5 mg/dL — ABNORMAL LOW (ref 18–38)

## 2020-05-17 LAB — CALCIUM, IONIZED: Calcium, Ionized, Serum: 4 mg/dL — ABNORMAL LOW (ref 4.5–5.6)

## 2020-05-17 LAB — PHOSPHORUS: Phosphorus: 5.9 mg/dL — ABNORMAL HIGH (ref 2.5–4.6)

## 2020-05-17 SURGERY — LAPAROTOMY, EXPLORATORY
Anesthesia: General | Site: Abdomen

## 2020-05-17 MED ORDER — PIPERACILLIN-TAZOBACTAM 3.375 G IVPB
3.3750 g | Freq: Two times a day (BID) | INTRAVENOUS | Status: DC
  Administered 2020-05-17: 3.375 g via INTRAVENOUS
  Filled 2020-05-17 (×2): qty 50

## 2020-05-17 MED ORDER — ACETAMINOPHEN 10 MG/ML IV SOLN
1000.0000 mg | Freq: Four times a day (QID) | INTRAVENOUS | Status: DC | PRN
  Administered 2020-05-18: 1000 mg via INTRAVENOUS
  Filled 2020-05-17 (×2): qty 100

## 2020-05-17 MED ORDER — INSULIN DETEMIR 100 UNIT/ML ~~LOC~~ SOLN
15.0000 [IU] | Freq: Two times a day (BID) | SUBCUTANEOUS | Status: DC
  Administered 2020-05-17 – 2020-05-18 (×3): 15 [IU] via SUBCUTANEOUS
  Filled 2020-05-17 (×4): qty 0.15

## 2020-05-17 MED ORDER — PROPOFOL 10 MG/ML IV BOLUS
INTRAVENOUS | Status: AC
  Filled 2020-05-17: qty 20

## 2020-05-17 MED ORDER — SODIUM CHLORIDE 0.9 % IV SOLN
4.0000 g | Freq: Once | INTRAVENOUS | Status: AC
  Administered 2020-05-17: 4 g via INTRAVENOUS
  Filled 2020-05-17: qty 40

## 2020-05-17 MED ORDER — CALCIUM CHLORIDE 10 % IV SOLN
INTRAVENOUS | Status: DC | PRN
Start: 2020-05-17 — End: 2020-05-17
  Administered 2020-05-17 (×3): 100 mg via INTRAVENOUS

## 2020-05-17 MED ORDER — FENTANYL CITRATE (PF) 250 MCG/5ML IJ SOLN
INTRAMUSCULAR | Status: AC
  Filled 2020-05-17: qty 5

## 2020-05-17 MED ORDER — SODIUM CHLORIDE 0.9% IV SOLUTION: Freq: Once | INTRAVENOUS | Status: AC

## 2020-05-17 MED ORDER — VECURONIUM BROMIDE 10 MG IV SOLR
INTRAVENOUS | Status: DC | PRN
  Administered 2020-05-17: 5 mg via INTRAVENOUS
  Administered 2020-05-17: 1 mg via INTRAVENOUS
  Administered 2020-05-17: 2 mg via INTRAVENOUS

## 2020-05-17 MED ORDER — LACTATED RINGERS IV SOLN: INTRAVENOUS | Status: DC

## 2020-05-17 MED ORDER — SODIUM CHLORIDE (PF) 0.9 % IJ SOLN
INTRAMUSCULAR | Status: AC
  Filled 2020-05-17: qty 10

## 2020-05-17 MED ORDER — ROCURONIUM BROMIDE 10 MG/ML (PF) SYRINGE
PREFILLED_SYRINGE | INTRAVENOUS | Status: AC
  Filled 2020-05-17: qty 10

## 2020-05-17 MED ORDER — 0.9 % SODIUM CHLORIDE (POUR BTL) OPTIME
TOPICAL | Status: DC | PRN
  Administered 2020-05-17: 1000 mL

## 2020-05-17 MED ORDER — TRAVASOL 10 % IV SOLN
INTRAVENOUS | Status: AC
  Filled 2020-05-17: qty 658.8

## 2020-05-17 MED ORDER — FENTANYL CITRATE (PF) 100 MCG/2ML IJ SOLN
INTRAMUSCULAR | Status: DC | PRN
  Administered 2020-05-17 (×2): 50 ug via INTRAVENOUS
  Administered 2020-05-17: 150 ug via INTRAVENOUS

## 2020-05-17 MED ORDER — SODIUM CHLORIDE 0.9% IV SOLUTION: Freq: Once | INTRAVENOUS | Status: DC

## 2020-05-17 MED ORDER — VECURONIUM BROMIDE 10 MG IV SOLR
INTRAVENOUS | Status: AC
  Filled 2020-05-17: qty 10

## 2020-05-17 MED ORDER — BUPIVACAINE HCL (PF) 0.25 % IJ SOLN
INTRAMUSCULAR | Status: AC
  Filled 2020-05-17: qty 30

## 2020-05-17 MED ORDER — SUCCINYLCHOLINE CHLORIDE 200 MG/10ML IV SOSY
PREFILLED_SYRINGE | INTRAVENOUS | Status: AC
  Filled 2020-05-17: qty 10

## 2020-05-17 MED ORDER — LIDOCAINE 2% (20 MG/ML) 5 ML SYRINGE
INTRAMUSCULAR | Status: AC
  Filled 2020-05-17: qty 5

## 2020-05-17 MED ORDER — MIDAZOLAM HCL 2 MG/2ML IJ SOLN
INTRAMUSCULAR | Status: AC
  Filled 2020-05-17: qty 2

## 2020-05-17 MED ORDER — CALCIUM CHLORIDE 10 % IV SOLN
INTRAVENOUS | Status: AC
  Filled 2020-05-17: qty 10

## 2020-05-17 SURGICAL SUPPLY — 58 items
BENZOIN TINCTURE PRP APPL 2/3 (GAUZE/BANDAGES/DRESSINGS) ×3 IMPLANT
BLADE CLIPPER SURG (BLADE) IMPLANT
CANISTER SUCT 3000ML PPV (MISCELLANEOUS) ×3 IMPLANT
CANISTER WOUND CARE 500ML ATS (WOUND CARE) ×2 IMPLANT
CHLORAPREP W/TINT 26 (MISCELLANEOUS) ×3 IMPLANT
CLOSURE WOUND 1/2 X4 (GAUZE/BANDAGES/DRESSINGS) ×1
COVER SURGICAL LIGHT HANDLE (MISCELLANEOUS) ×3 IMPLANT
COVER WAND RF STERILE (DRAPES) ×3 IMPLANT
DRAPE LAPAROSCOPIC ABDOMINAL (DRAPES) ×3 IMPLANT
DRAPE WARM FLUID 44X44 (DRAPES) ×3 IMPLANT
DRSG OPSITE POSTOP 4X10 (GAUZE/BANDAGES/DRESSINGS) IMPLANT
DRSG OPSITE POSTOP 4X8 (GAUZE/BANDAGES/DRESSINGS) IMPLANT
DRSG TEGADERM 4X4.75 (GAUZE/BANDAGES/DRESSINGS) ×2 IMPLANT
DRSG VAC ATS LRG SENSATRAC (GAUZE/BANDAGES/DRESSINGS) ×2 IMPLANT
ELECT BLADE 6.5 EXT (BLADE) IMPLANT
ELECT CAUTERY BLADE 6.4 (BLADE) ×4 IMPLANT
ELECT REM PT RETURN 9FT ADLT (ELECTROSURGICAL) ×3
ELECTRODE REM PT RTRN 9FT ADLT (ELECTROSURGICAL) ×1 IMPLANT
GAUZE SPONGE 4X4 12PLY STRL (GAUZE/BANDAGES/DRESSINGS) ×3 IMPLANT
GAUZE SPONGE 4X4 12PLY STRL LF (GAUZE/BANDAGES/DRESSINGS) ×2 IMPLANT
GLOVE BIO SURGEON STRL SZ7 (GLOVE) ×3 IMPLANT
GLOVE BIOGEL PI IND STRL 7.5 (GLOVE) ×1 IMPLANT
GLOVE BIOGEL PI INDICATOR 7.5 (GLOVE) ×2
GOWN STRL REUS W/ TWL LRG LVL3 (GOWN DISPOSABLE) ×2 IMPLANT
GOWN STRL REUS W/TWL LRG LVL3 (GOWN DISPOSABLE) ×4
HANDLE SUCTION POOLE (INSTRUMENTS) ×1 IMPLANT
KIT BASIN OR (CUSTOM PROCEDURE TRAY) ×3 IMPLANT
KIT TUBE JEJUNAL 16FR (CATHETERS) ×2 IMPLANT
KIT TURNOVER KIT B (KITS) ×3 IMPLANT
LIGASURE IMPACT 36 18CM CVD LR (INSTRUMENTS) IMPLANT
NDL HYPO 25GX1X1/2 BEV (NEEDLE) ×1 IMPLANT
NEEDLE HYPO 25GX1X1/2 BEV (NEEDLE) ×3 IMPLANT
NS IRRIG 1000ML POUR BTL (IV SOLUTION) ×6 IMPLANT
PACK GENERAL/GYN (CUSTOM PROCEDURE TRAY) ×3 IMPLANT
PAD ARMBOARD 7.5X6 YLW CONV (MISCELLANEOUS) ×3 IMPLANT
PENCIL SMOKE EVACUATOR (MISCELLANEOUS) ×3 IMPLANT
PLUG CATH AND CAP STER (CATHETERS) IMPLANT
SPECIMEN JAR LARGE (MISCELLANEOUS) IMPLANT
SPONGE LAP 18X18 RF (DISPOSABLE) ×2 IMPLANT
STAPLER VISISTAT 35W (STAPLE) ×3 IMPLANT
STRIP CLOSURE SKIN 1/2X4 (GAUZE/BANDAGES/DRESSINGS) ×2 IMPLANT
SUCTION POOLE HANDLE (INSTRUMENTS) ×3
SUT ETHILON 2 0 FS 18 (SUTURE) ×5 IMPLANT
SUT NOVA 1 T20/GS 25DT (SUTURE) IMPLANT
SUT NOVA NAB DX-16 0-1 5-0 T12 (SUTURE) ×6 IMPLANT
SUT PDS AB 1 TP1 96 (SUTURE) ×2 IMPLANT
SUT SILK 2 0 SH CR/8 (SUTURE) ×5 IMPLANT
SUT SILK 2 0 TIES 10X30 (SUTURE) ×3 IMPLANT
SUT SILK 3 0 SH CR/8 (SUTURE) ×3 IMPLANT
SUT SILK 3 0 TIES 10X30 (SUTURE) ×3 IMPLANT
SUT VIC AB 3-0 SH 18 (SUTURE) IMPLANT
SYR 10ML LL (SYRINGE) ×2 IMPLANT
SYR 20ML LL LF (SYRINGE) ×3 IMPLANT
SYR CONTROL 10ML LL (SYRINGE) ×3 IMPLANT
SYRINGE TOOMEY DISP (SYRINGE) ×3 IMPLANT
TOWEL GREEN STERILE (TOWEL DISPOSABLE) ×3 IMPLANT
TUBE GASTROSTOMY 18F (CATHETERS) ×2 IMPLANT
TUBE MOSS GAS 18FR (TUBING) IMPLANT

## 2020-05-17 NOTE — Transfer of Care (Signed)
Immediate Anesthesia Transfer of Care Note  Patient: Ethan Johnson  Procedure(s) Performed: EXPLORATORY LAPAROTOMY (N/A Abdomen) REMOVE OF JEJUNOSTOMY TUBE, INSERTATION OF JEJUNOSTOMY AND GASTROSTOMY TUBE  AND ABDOMINAL VAC CHANGE (N/A Abdomen)  Patient Location: ICU  Anesthesia Type:General  Level of Consciousness: unresponsive and Patient remains intubated per anesthesia plan  Airway & Oxygen Therapy: Patient remains intubated per anesthesia plan and Patient placed on Ventilator (see vital sign flow sheet for setting)  Post-op Assessment: Report given to RN, Post -op Vital signs reviewed and stable and Patient moving all extremities  Post vital signs: Reviewed and stable  Last Vitals:  Vitals Value Taken Time  BP    Temp    Pulse 91 05/07/2020 1429  Resp 18 05/16/2020 1429  SpO2 97 % 05/08/2020 1429  Vitals shown include unvalidated device data.  Last Pain:  Vitals:   05/01/2020 1200  TempSrc: Axillary  PainSc:       Patients Stated Pain Goal: 0 (05/10/20 2000)  Complications: No apparent anesthesia complications

## 2020-05-17 NOTE — Anesthesia Postprocedure Evaluation (Signed)
Anesthesia Post Note  Patient: Ethan Johnson  Procedure(s) Performed: EXPLORATORY LAPAROTOMY (N/A Abdomen) REMOVE OF JEJUNOSTOMY TUBE, INSERTATION OF JEJUNOSTOMY AND GASTROSTOMY TUBE  AND ABDOMINAL VAC CHANGE (N/A Abdomen)     Patient location during evaluation: SICU Anesthesia Type: General Level of consciousness: sedated Pain management: pain level controlled Vital Signs Assessment: post-procedure vital signs reviewed and stable Respiratory status: patient remains intubated per anesthesia plan Cardiovascular status: stable Postop Assessment: no apparent nausea or vomiting Anesthetic complications: no    Last Vitals:  Vitals:   05/21/2020 1434 05/21/2020 1500  BP: (!) 168/60 (!) 124/51  Pulse: 92   Resp: 20   Temp:    SpO2: 98%     Last Pain:  Vitals:   04/24/2020 1200  TempSrc: Axillary  PainSc:                  Lucretia Kern

## 2020-05-17 NOTE — Progress Notes (Signed)
Spoke with Amy RN, MD Agarwala approved the TPN to go to the Central line.

## 2020-05-17 NOTE — Anesthesia Preprocedure Evaluation (Signed)
Anesthesia Evaluation  Patient identified by MRN, date of birth, ID band Patient unresponsive    Reviewed: Allergy & Precautions, H&P , NPO status , Patient's Chart, lab work & pertinent test results  Airway Mallampati: Intubated       Dental  (+)    Pulmonary former smoker,  Acute respiratory failure, mechanically ventilated      + intubated    Cardiovascular negative cardio ROS   Rhythm:regular Rate:Tachycardia     Neuro/Psych negative neurological ROS  negative psych ROS   GI/Hepatic Neg liver ROS, Gastric volvulus with necrosis, s/p ex lap, resection   Endo/Other  diabetes, Type 2  Renal/GU ARFRenal disease   BPH w/ BOO    Musculoskeletal negative musculoskeletal ROS (+)   Abdominal   Peds  Hematology  (+) anemia ,   Anesthesia Other Findings   Reproductive/Obstetrics                             Anesthesia Physical  Anesthesia Plan  ASA: IV and emergent  Anesthesia Plan: General   Post-op Pain Management:    Induction: Inhalational and Intravenous  PONV Risk Score and Plan: 3 and Treatment may vary due to age or medical condition  Airway Management Planned: Oral ETT  Additional Equipment: Arterial line  Intra-op Plan:   Post-operative Plan: Post-operative intubation/ventilation  Informed Consent: I have reviewed the patients History and Physical, chart, labs and discussed the procedure including the risks, benefits and alternatives for the proposed anesthesia with the patient or authorized representative who has indicated his/her understanding and acceptance.       Plan Discussed with:   Anesthesia Plan Comments:         Anesthesia Quick Evaluation

## 2020-05-17 NOTE — Progress Notes (Signed)
PHARMACY - TOTAL PARENTERAL NUTRITION CONSULT NOTE   Indication: bowel necrosis  Patient Measurements: Height: 5\' 10"  (177.8 cm) Weight: 112.5 kg (248 lb 0.3 oz) IBW/kg (Calculated) : 73 TPN AdjBW (KG): 84.2 Body mass index is 35.59 kg/m.  Assessment: 53 yom originally presenting 5/14 with abdominal pain, urinary retention due to worsening symptoms of prostatism and elevated PSA. Symptoms improved s/p Foley. Klebsiella bacteremia treated with ceftriaxone. 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 Now s/p OR 5/20 for subtotal gastrectomy with placement of jejunostomy tube. Patient then underwent thoracotomy and more proximal esophagectomy. Plan back to OR 5/25. May also need further TCTS procedure. Holding TF. Pharmacy consulted to start TPN.  Patient currently intubated and sedated (fentanyl drip + Precedex) in ICU with open abdomen, back on low-dose norepinephrine 5/25 AM.  Glucose / Insulin: hx DM2, A1c 7.6. CBGs 200s on sSSI q4h - utilized 17 units in last 24 hrs. S/p decadron 10mg  IV x 1 on 5/23 Electrolytes: Na 150 (up), Cl 115, Phos 5.9, others WNL Renal: AKI - SCr back up to 3.53 (1.06 on 5/18), BUN up to 74; +5.6L this admit LFTs / TGs: AST up to 146, ALT wnl (noted on fluconazole), Tbili up to 1.4, TG down to 163 (off propofol 5/24) Prealbumin / albumin: Prealbumin <5, albumin 1.0 Intake / Output; MIVF: G-tube with 353 ml/24 hrs bilious output; LBM 5/20; UOP 0.5 ml/kg/hr; LR at 40 ml/hr GI Imaging: 5/19 CT abd - stomach distended by air, fluid, ingested contrast with unusual dependent air loculations of gastric fundus, high-grade gastric outlet obstruction, new bilateral pleural effusions 5/23 CT abd - gas and free fluid in LU abd, scattered free air and moderate  pneumoperitoneum, small volume ascites; post-surgical changes at upper range of normal for recent surgery Surgeries / Procedures: 5/21 partial gastrectomy, gastrojejunostomy tube placement 5/23 ex-lap, L thoracotomy/resection of GE junction and distal esophagus for ongoing necrosis, ab-thera vac placement 5/25 abd washout, attempt abdomen closure, revision of jejunostomy tube for nutrition and g-tube for gastric drainage  Central access: double lumen CVC, planning PICC 5/25 TPN start date: 05/13/2020 (TPN not hung 5/24 due to CL tip not in recommended area and PICC not yet placed)  Nutritional Goals (per RD recommendation on 5/21): kCal: 2500, Protein: 150-170g, Fluid: >2L Goal TPN rate is 110 mL/hr (provides 161 g of protein and 2461 kcals per day)  Current Nutrition:  NPO and TPN  Plan:  Start TPN at 45 mL/hr at 1800. Titrate to goal as appropriate. -TPN will provide 66g protein, 130g CHO, 30g SMOF lipids, for 1006 total kCal, meeting 44% of protein and 40% of patient kCal needs. Electrolytes in TPN: reduce to 39mEq/L of Na, reduce to 80mEq/L of K and 16mmol/L of Phos with AKI, standard 23mEq/L of Ca and 73mEq/L of Mg; Cl:Ac ratio 1:2 Add standard MVI and trace elements to TPN. Remove chromium for now with AKI CCM added Levemir 15u Barry BID + continue Sensitive q4h SSI and adjust as needed  Reduce LR to KVO mL/hr at 1800 when TPN bag hung Monitor TPN labs on Mon/Thurs, f/u Surgery/TCTS plans   4m, PharmD, BCPS Please check AMION for all [facility] Pharmacy contact numbers Clinical Pharmacist 05/16/2020 8:13 AM

## 2020-05-17 NOTE — Progress Notes (Signed)
NAME:  Ethan Johnson, MRN:  [id number], DOB:  1946/04/11, LOS: 70 ADMISSION DATE:  05/05/2020, CONSULTATION DATE:  05/20/2020 REFERRING MD:  [place] Silos - Trauma, CHIEF COMPLAINT:  Gastric infarction.   HPI/course in hospital  74 year old man who originally presented 6 days ago with abdominal pain and was found to have urinary retention in the context of worsening symptoms of prostatism and an elevated PSA. Symptoms improved following insertion of Foley and drainage of 1.5L of urine.  SVT at that time which was felt to be sinus tachycardia as no conversion with adenosine. Hypotension and tachycardia improved initially with relief of obstruction.   Found to have Klebsiella bacteremia which was treated with ceftriaxone. Transitioned to orals and was to be discharged with Foley in place for outpatient urology evaluation.  Noted to have gastric distension either due to outlet obstruction or gastroparesis, exacerbated by acute illness.   5/19 developed sudden abdominal pain with tachypnea and desaturation. Loose dark stools.   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   5/20 OR  -  Subtotal gastrectomy with placement of jejunostomy tube. Normotensive throughout the case, minimal blood loss, fluids given.  Returned to ICU intubated.  5/23 OR - Purulent ascites was drained from the abdomen.:  Small bowel all up.  Viable.  Gastrojejunostomy was in good position.  Source was felt to be dehiscence of the proximal staple line at the level of the esophagus.  Patient then underwent thoracotomy and more proximal esophagectomy.  Plan to take patient back tomorrow for further washout.   Past Medical History   Past Medical History:  Diagnosis Date  . 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 Aortic atherosclerosis ([place])   . BPH (benign prostatic hyperplasia)   . Diabetes mellitus without complication ([place])   . Hepatic steatosis   . Neuropathy      Past Surgical History:  Procedure  Laterality Date  . APPLICATION OF WOUND VAC N/A 05/13/2020   Procedure: Application Of Abthera Wound Vac;  Surgeon: Clovis Riley, MD;  Location: [place];  Service: General;  Laterality: N/A;  . ESOPHAGOGASTRODUODENOSCOPY (EGD) WITH PROPOFOL N/A 05/09/2020   Procedure: ESOPHAGOGASTRODUODENOSCOPY (EGD) WITH PROPOFOL;  Surgeon: Milus Banister, MD;  Location: [facility] ENDOSCOPY;  Service: Endoscopy;  Laterality: N/A;  . LAPAROTOMY N/A 05/11/2020   Procedure: SUBTOTAL GASTRECTOMY, JEJUNOSTOMY TUBE;  Surgeon: Jesusita Oka, MD;  Location: [place];  Service: General;  Laterality: N/A;  . LAPAROTOMY N/A 05/18/2020   Procedure: EXPLORATORY LAPAROTOMY;  Surgeon: Clovis Riley, MD;  Location: [place];  Service: General;  Laterality: N/A;  . VIDEO ASSISTED THORACOSCOPY (VATS)/THOROCOTOMY Left 05/20/2020   Procedure: Video Assisted Thoracoscopy (Vats)/Thorocotomy;  Surgeon: Wonda Olds, MD;  Location: [place];  Service: Thoracic;  Laterality: Left;     Interim history/subjective:   For return to OR today.  Awake today, communicates pain. Improving urine output. Minimal vasopressor requirements.   Objective   Blood pressure (!) 115/56, pulse 89, temperature 98.9 F (37.2 C), temperature source Axillary, resp. rate (!) 21, height 5\' 10"  (1.778 m), weight 112.5 kg, SpO2 100 %. CVP:  [10 mmHg-14 mmHg] 14 mmHg  Vent Mode: PRVC FiO2 (%):  [40 %] 40 % Set Rate:  [16 bmp] 16 bmp Vt Set:  [580 mL] 580 mL PEEP:  [5 cmH20] 5 cmH20 Plateau Pressure:  [16 cmH20-21 cmH20] 16 cmH20   Intake/Output Summary (Last 24 hours) at 05/11/2020 0919 Last data filed at 05/22/2020 0800  Gross per 24 hour  Intake 2561.21 ml  Output 2553 ml  Net 8.21 ml   Filed Weights   04/29/2020 0500 05/16/20 0500 04/23/2020 0500  Weight: 108.5 kg 112 kg 112.5 kg    Examination: Physical Exam Constitutional:      Appearance: He is obese. He is ill-appearing.     Interventions: He is sedated and intubated.  HENT:     Head:      Comments: NGT tube in place with minimal bloody drainage.      Mouth/Throat:     Mouth: Mucous membranes are moist.  Eyes:     General: No scleral icterus. Neck:     Trachea: Trachea normal.     Comments: Orally intubated Cardiovascular:     Rate and Rhythm: Regular rhythm. Tachycardia present.     Heart sounds: Normal heart sounds.  Pulmonary:     Effort: Pulmonary effort is normal. He is intubated.     Breath sounds: Normal breath sounds and air entry.     Comments: Left chest tube with no air leak.  Abdominal:     General: A surgical scar is present. Bowel sounds are decreased.     Tenderness: There is no abdominal tenderness.       Comments: Clean mid line incision. Jejunostomy RUQ. LUQ JP with  minimal bloody drainage age.  Genitourinary:    Comments: Foley in place. Neurological:     Mental Status: He is oriented to person, place, and time. He is lethargic.     Comments: Follows simple commands weakly.      Ancillary tests (personally reviewed)  CBC: Recent Labs  Lab 05/14/20 0951 05/14/20 0951 04/30/2020 0417 05/18/2020 0449 04/25/2020 1319 05/09/2020 1417 05/16/2020 1700 05/16/20 0530 04/23/2020 0508  WBC 32.6*  --  20.9*  --   --   --  17.1* 19.2* 18.3*  NEUTROABS 22.5*  --  15.1*  --   --   --   --  11.8* PENDING  HGB 9.5*   < > 11.1*   < > 9.5* 9.2* 9.7* 8.6* 7.2*  HCT 29.9*   < > 35.0*   < > 28.0* 27.0* 31.7* 27.4* 22.5*  MCV 85.2  --  84.7  --   --   --  86.1 84.0 82.7  PLT 341  --  400  --   --   --  468* 452* 399   < > = values in this interval not displayed.    Basic Metabolic Panel: Recent Labs  Lab 06-05-2020 0629 06/05/20 0629 Jun 05, 2020 1407 06/05/2020 1757 05/13/20 1818 05/14/20 0951 05/14/20 0951 05/14/20 1612 05/14/2020 0417 05/01/2020 0449 05/10/2020 1247 05/02/2020 1319 05/01/2020 1417 05/16/20 0530 05/07/2020 0508  NA 138   < > 141   < >  --  147*   < >  --  148*   < > 150* 150* 149* 149* 150*  K 4.4   < > 3.2*   < >  --  3.8   < >  --  3.7   < > 3.7  3.7 3.6 3.8 4.2  CL 103   < > 102  --   --  109  --   --  110  --   --   --   --  115* 115*  CO2 22  --   --   --   --  25  --   --  26  --   --   --   --  21* 22  GLUCOSE 147*   < > 135*  --   --  210*  --   --  167*  --   --   --   --  252* 275*  BUN 16   < > 15  --   --  16  --   --  17  --   --   --   --  43* 74*  CREATININE 1.24   < > 1.10  --   --  1.29*  --   --  1.41*  --   --   --   --  2.56* 3.53*  CALCIUM 7.8*  --   --   --   --  7.3*  --   --  7.4*  --   --   --   --  6.4* 6.3*  MG  --   --   --    < > 1.5* 1.8  --  1.7 1.9  --   --   --   --   --  2.1  PHOS  --   --   --   --  3.4 2.8  --  2.6 2.7  --   --   --   --   --  5.9*   < > = values in this interval not displayed.   GFR: Estimated Creatinine Clearance: 23.1 mL/min (A) (by C-G formula based on SCr of 3.53 mg/dL (H)). Recent Labs  Lab 05/11/20 1846 05/11/20 2122 04/26/2020 0629 05/14/20 0951 05/14/20 0951 05-28-20 0417 05-28-2020 1700 05/16/20 0530 05/19/2020 0508  WBC  --   --    < > 32.6*   < > 20.9* 17.1* 19.2* 18.3*  LATICACIDVEN 1.4 1.4  --  1.8  --  3.4*  --   --   --    < > = values in this interval not displayed.    Liver Function Tests: Recent Labs  Lab 05/14/20 0951 05/10/2020 0508  AST 21 146*  ALT 15 42  ALKPHOS 72 62  BILITOT 1.1 1.4*  PROT 5.3* 5.1*  ALBUMIN 1.6* 1.0*   No results for input(s): LIPASE, AMYLASE in the last 168 hours. No results for input(s): AMMONIA in the last 168 hours.  ABG    Component Value Date/Time   PHART 7.373 28-May-2020 1417   PCO2ART 45.4 May 28, 2020 1417   PO2ART 321 (H) 05-28-20 1417   HCO3 26.4 2020-05-28 1417   TCO2 28 2020-05-28 1417   ACIDBASEDEF 0.4 05/13/2020 0825   O2SAT 100.0 05-28-20 1417     Coagulation Profile: No results for input(s): INR, PROTIME in the last 168 hours.  Cardiac Enzymes: No results for input(s): CKTOTAL, CKMB, CKMBINDEX, TROPONINI in the last 168 hours.  HbA1C: Hgb A1c MFr Bld  Date/Time Value Ref Range Status    05/01/2020 09:44 AM 7.6 (H) 4.8 - 5.6 % Final    Comment:    (NOTE) Pre diabetes:          5.7%-6.4% Diabetes:              >6.4% Glycemic control for   <7.0% adults with diabetes     CBG: Recent Labs  Lab 05/16/20 1604 05/16/20 2040 05/03/2020 0027 05/22/2020 0435 05/19/2020 0757  GLUCAP 222* 225* 203* 254* 226*    Assessment & Plan:   Remains critically ill due to expected acute hypoxic respiratory failure following partial gastrectomy. - Analgesia for upper abdominal incision.  - At increased risk of  post-operative pulmonary complications given body habitus and location of incision.  -Continue full ventilatory support until definitive surgery complete.   Critically ill due to hypovolemic and distributive shock requiring titration of vasopressors and fluid administration. Now resolved. - follow CVP but clinically hypervolemic and intravascularly volume replete.   AKI from critical illness - appears euvolemic as pressor requirement have resolved. Abdomen still, open so compartment syndrome unlikely.  Creatinine increasing but urine output improving, so AKI may be reaching its peak. - follow creatinine - no indications for acute dialysis.  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 New abdominal pain with possible leakage from PEG site. - No manipulation of NGT -Hold feeds. -Investigate possible PEG dysfunction per surgery.  Urinary retention due to BPH - Leave Foley in place.  Daily Goals Checklist  Pain/Anxiety/Delirium protocol (if indicated): Fentanyl and propofol infusions.  Switch to dexmedetomidine in anticipation of TPN VAP protocol (if indicated): Bundle in place Respiratory support goals: Full ventilatory support Blood pressure target: Titrate norepinephrine to keep MAP greater than 65.  Follow CVP, fluid bolus for CVP less than 4. DVT prophylaxis: Heparin TID Nutritional status and feeding goals:  moderate risk, feeds off due to tube dysfunction..  Start TPN GI prophylaxis: Not required Fluid status goals: Ongoing fluid resuscitation Urinary catheter: urinary retention. Central lines: Arterial line, R IJ CVC will need PICC for TPN Glucose control: Suboptimally controlled on SSI. T2DM with HbA1C 7.6 - added Detemir Mobility/therapy needs: Mobilize to chair as tolerated. Antibiotic de-escalation: perioperative antibiotics only. Home medication reconciliation: on hold Daily labs: CBC, BMET Code Status: full  Family Communication: Family updated per surgery today Disposition: ICU   CRITICAL CARE Performed by: Lynnell Catalan   Total critical care time: 40 minutes  Critical care time was exclusive of separately billable procedures and treating other patients.  Critical care was necessary to treat or prevent imminent or life-threatening deterioration.  Critical care was time spent personally by me on the following activities: development of treatment plan with patient and/or surrogate as well as nursing, discussions with consultants, evaluation of patient's response to treatment, examination of patient, obtaining history from patient or surrogate, ordering and performing treatments and interventions, ordering and review of laboratory studies, ordering and review of radiographic studies, pulse oximetry, re-evaluation of patient's condition and participation in multidisciplinary rounds.  Lynnell Catalan, MD [facility] ICU Physician [facility] Critical Care  Pager: [phone] Mobile: [phone] After hours: [phone].      05/18/2020, 9:19 AM

## 2020-05-17 NOTE — Progress Notes (Signed)
2 Days Post-Op     Subjective/Chief Complaint: Off pressors when I went in to evaluate the patient, MAP dropped to 58 and levo was restarted at 2 mcg.  On fentanyl/dex On IV Zosyn   TMAX 101 late yesterday, HR 88, BP 115/58  hgb 7.2 WBC 18 Hypernatremia 150 Renal fxn slighty worse today - BUN 74, Scr 3.53 Objective: Vital signs in last 24 hours: Temp:  [99.7 F (37.6 C)-101.1 F (38.4 C)] 99.8 F (37.7 C) (05/25 0337) Pulse Rate:  [25-111] 87 (05/25 0730) Resp:  [19-33] 19 (05/25 0730) BP: (86-139)/(51-63) 86/51 (05/25 0730) SpO2:  [83 %-100 %] 100 % (05/25 0730) Arterial Line BP: (85-144)/(46-62) 85/48 (05/25 0730) FiO2 (%):  [40 %] 40 % (05/25 0337) Weight:  [112.5 kg] 112.5 kg (05/25 0500) Last BM Date: 05/10/2020  Intake/Output from previous day: 05/24 0701 - 05/25 0700 In: 2589 [I.V.:1875; IV Piggyback:714] Out: 2553 [BTDVV:6160; Drains:778; Chest Tube:100] Intake/Output this shift: No intake/output data recorded.  Elderly male - intubated but arousable Nasoesophageal tube - in place with adequate suction Gastrostomy tube - green bilious drainage Abd - quite, AbThera vac with good seal, SS drainage in cannister  JP drain - serosanguinous drainage  Lab Results:  Recent Labs    05/16/20 0530 04/24/2020 0508  WBC 19.2* 18.3*  HGB 8.6* 7.2*  HCT 27.4* 22.5*  PLT 452* 399   BMET Recent Labs    05/16/20 0530 04/29/2020 0508  NA 149* 150*  K 3.8 4.2  CL 115* 115*  CO2 21* 22  GLUCOSE 252* 275*  BUN 43* 74*  CREATININE 2.56* 3.53*  CALCIUM 6.4* 6.3*   PT/INR No results for input(s): LABPROT, INR in the last 72 hours. ABG Recent Labs    May 20, 2020 1319 05/20/2020 1417  PHART 7.366 7.373  HCO3 26.7 26.4    Studies/Results: DG CHEST PORT 1 VIEW  Result Date: 05/16/2020 CLINICAL DATA:  Chest tube, pneumothorax EXAM: PORTABLE CHEST 1 VIEW COMPARISON:  Portable exam 0918 hours compared to 05/20/2020 FINDINGS: Tip of endotracheal tube projects 1.9 cm above  carina. Tip of nasogastric tube projects over mid to distal esophagus, recommend advancing tube 15 cm. Three LEFT thoracostomy tubes unchanged LEFT jugular central venous catheter with tip projecting over SVC. Normal heart size, mediastinal contours, and pulmonary vascularity. Bibasilar atelectasis. No pleural effusion or pneumothorax. IMPRESSION: Bibasilar atelectasis. Three LEFT thoracostomy tubes without pneumothorax. Recommend advancing nasogastric tube 15 cm to place within stomach. Findings called to Wynona Meals RN on 05/16/2020 at 0934 hours. Electronically Signed   By: Lavonia Dana M.D.   On: 05/16/2020 09:35   DG CHEST PORT 1 VIEW  Result Date: 05-20-20 CLINICAL DATA:  Evaluate central line placement EXAM: PORTABLE CHEST 1 VIEW COMPARISON:  2020/05/20 FINDINGS: The ETT terminates in good position. The NG tube terminates in the esophagus, unchanged. Two left chest tubes are identified with no left-sided pneumothorax. A small left effusion remains. Skin staples are seen over the lower lateral chest. A new left central line terminates near the brachiocephalic confluence. The cardiomediastinal silhouette is stable. No other interval changes. IMPRESSION: 1. The distal tip of the new left central line is near or just below the brachiocephalic confluence without pneumothorax. 2. Other support apparatus as above. 3. No other significant interval changes. Electronically Signed   By: Dorise Bullion III M.D   On: 05/20/20 16:35   [place] EKG SITE RITE  Result Date: 05/16/2020 If Site Rite image not attached, placement could not be confirmed  due to current cardiac rhythm.   Anti-infectives: Anti-infectives (From admission, onward)   Start     Dose/Rate Route Frequency Ordered Stop   05/16/20 2300  fluconazole (DIFLUCAN) IVPB 200 mg     200 mg 100 mL/hr over 60 Minutes Intravenous Every 24 hours 05/16/20 1247     05/16/20 2000  fluconazole (DIFLUCAN) IVPB 400 mg  Status:  Discontinued     400 mg 100  mL/hr over 120 Minutes Intravenous Every 24 hours 04/25/2020 1936 05/16/20 1247   05/21/2020 2000  fluconazole (DIFLUCAN) IVPB 800 mg     800 mg 200 mL/hr over 120 Minutes Intravenous  Once 05/10/2020 1936 04/25/2020 2339   04/26/2020 1945  fluconazole (DIFLUCAN) IVPB 100 mg  Status:  Discontinued    Note to Pharmacy: Pharmacy may change dose as needed  For intraabdominal infection/ florid leak from esophagogastric junction   100 mg 50 mL/hr over 60 Minutes Intravenous Every 24 hours 05/19/2020 1931 04/25/2020 1936   05/08/2020 0600  piperacillin-tazobactam (ZOSYN) IVPB 3.375 g     3.375 g 12.5 mL/hr over 240 Minutes Intravenous Every 8 hours 04/29/2020 0544     05/10/20 0600  ceFAZolin (ANCEF) IVPB 2g/100 mL premix  Status:  Discontinued     2 g 200 mL/hr over 30 Minutes Intravenous Every 8 hours 05/09/20 1542 04/28/2020 0544   05/07/20 1000  cefTRIAXone (ROCEPHIN) 2 g in sodium chloride 0.9 % 100 mL IVPB  Status:  Discontinued     2 g 200 mL/hr over 30 Minutes Intravenous Every 24 hours 05/04/2020 1036 05/09/20 1542   05/22/2020 0745  cefTRIAXone (ROCEPHIN) 2 g in sodium chloride 0.9 % 100 mL IVPB     2 g 200 mL/hr over 30 Minutes Intravenous  Once 05/09/2020 0730 05/21/2020 2458      Assessment/Plan: DM2  Adrenal incidentaloma Urinary retention enlarged prostate Klebsiella bacteremia  Acute hypoxic respiratory failure- resolved Gastric necrosis, gastric volvulus   S/p partial gastrectomy, gastrojejunostomy tube placement - Dr. Bedelia Person 5/21 S/p exp lap/ left thoracotomy/ resection of GE junction and distal esophagus for ongoing necrosis - Dr. Lenell Antu 05/08/2020 Ab-Thera VAC placement 04/29/2020  - Hemodynamically improved, WBC 18; suspect patient a little volume depleted - NO MEDS per G or J tube.  - IV tylenol, IV robaxin, HM PCA for pain.  - Plan to return to OR today for washout, possible GJ tube revision, possible closure. -TCTS to determine whether he needs repeat thoracotomy to examine  remaining esophagus.  Patient may need a spit fistula and may need to be left in discontinuity until he is clinically improved.  Then, he will need reconstruction with colon interposition vs. Roux-en-Y esophagojejunostomy  FEN: NPO, IVF ID: Zosyn VTE: SCD's, SQH Foley: in place 2/2 prostatism with urosepsis   Dispo: OR today for re-exploration Will discuss transfusing pRBC pre-op with Dr. Corliss Skains    LOS: 11 days    Francine Graven [facility] 2020-06-16

## 2020-05-17 NOTE — Op Note (Signed)
Preop diagnosis: Open abdomen after near total gastric resection for gastric necrosis Postop diagnosis: Same Procedure performed: Exploratory laparotomy, revision of gastrojejunostomy tube to a gastrostomy tube, placement of feeding jejunostomy tube, closure of abdominal wall, placement of wound VAC (15 x 2 cm)  Surgeon:Achol Azpeitia K Argyle Gustafson Assistant:  Melina Modena, PA-C Anesthesia: General Indications: This is a critically ill 74 year old male who developed gastric necrosis for unclear etiology.  He underwent resection of most of the stomach on 05/13/2020.  He was left in discontinuity.  A gastrojejunostomy tube was placed at that time.  A surgical drain was placed in the left upper quadrant at that time as well.  The patient required reexploration on 05/19/2020.  He had further necrosis of the proximal stomach up onto the esophagus.  He had a left thoracotomy that was resected the distal esophagus.  He remains critically ill.  His abdomen was left open.  He returns today for washout.  Description of procedure: The patient is brought to the operating room intubated on his ICU bed.  He was moved to the operative table.  After an adequate level of general anesthesia was obtained, the vacuum dressing was removed.  His entire abdomen was prepped with Betadine and draped in sterile fashion.  A timeout was taken to ensure the proper patient and proper procedure.  The inner VAC sponge was removed.  We began exploring the abdomen.  There is some fibrinous exudate in the right upper quadrant but there is no sign of perforation or abscess in this area.  His gastrojejunostomy tube exits his right side.  There seems to be some tension on the gastric remnant.  Therefore I made the decision to revise this.  He may have had some leaking of tube feeds causing the fibrinous exudate.  The liver appears normal.  The gallbladder was identified was also normal.  The gastric remnant which includes just some of the antrum and pylorus  seems viable with no sign of leak.  The transverse colon and the greater omentum are healthy and viable.  All the small bowel that we identified was also viable.  No sign of perforation.  There is no sign of abscess in the left upper quadrant.  We remove the previous gastrojejunostomy tube which seem to be coiled up in the gastric remnant and was not actually in the duodenum.  We placed a pursestring suture of 2-0 silk around the gastrotomy.  I selected a different tube insertion site in the right upper quadrant.  We opened an 40 French gastrostomy tube and brought in through a stab incision.  We threaded this into the stomach and secured with the pursestring suture.  I placed an additional pursestring suture to help secure the G-tube.  We secured this to the anterior abdominal wall with multiple interrupted 3-0 silk sutures.  The tube seems to be patent and is draining nicely.  We then examined the small bowel.  I identified the ligament of Treitz.  About 20 cm distal to the ligament of Treitz I placed a small pursestring suture of 3-0 silk.  We made a small enterotomy.  I brought a 43 [place] feeding jejunostomy tube onto the field.  We inserted this through a small stab incision in the left upper quadrant.  We threaded this through the enterotomy and fed the long jejunal limb distally.  The balloon was partially inflated within the small bowel to help secure it in place.  The pursestring suture was tied down.  We created a  Stamm jejunostomy tunnel with multiple interrupted 2 oh silks.  The Stamm tunnel was secured to the anterior abdominal wall with 5 interrupted 2-0 silk sutures.  This should prevent twisting of the jejunostomy.  We irrigated the abdomen thoroughly and inspected for hemostasis.  All sponge and instrument counts were correct.  We closed the fascia with multiple interrupted figure-of-eight #1 Novafil sutures.  A small VAC sponge was cut to fit the subcutaneous tissue and was placed into the  wound.  This was secured with an occlusive drape.  This was placed to 125 mmHg suction.  The patient was then transported back to the intensive care unit in critical condition.  All sponge, instrument, and needle counts are correct.  Wilmon Arms. Corliss Skains, MD, [facility] Surgery  General/ Trauma Surgery   05/19/2020 2:24 PM

## 2020-05-17 NOTE — Consult Note (Signed)
WOC Nurse Consult Note: Consult received for new NPWT dressing change on Friday, 5/28.  Placed in OR today.  WOC Nurses are aware of consult and will see on Friday.   Thanks, Ladona Mow, MSN, RN, GNP, [place]  Pager# [phone]

## 2020-05-18 ENCOUNTER — Inpatient Hospital Stay (HOSPITAL_COMMUNITY): Payer: No Typology Code available for payment source

## 2020-05-18 DIAGNOSIS — J9601 Acute respiratory failure with hypoxia: Secondary | ICD-10-CM

## 2020-05-18 DIAGNOSIS — N183 Chronic kidney disease, stage 3 unspecified: Secondary | ICD-10-CM

## 2020-05-18 LAB — CBC WITH DIFFERENTIAL/PLATELET
Abs Immature Granulocytes: 0.35 10*3/uL — ABNORMAL HIGH (ref 0.00–0.07)
Basophils Absolute: 0.1 10*3/uL (ref 0.0–0.1)
Basophils Relative: 0 %
Eosinophils Absolute: 0 10*3/uL (ref 0.0–0.5)
Eosinophils Relative: 0 %
HCT: 25.8 % — ABNORMAL LOW (ref 39.0–52.0)
Hemoglobin: 8.2 g/dL — ABNORMAL LOW (ref 13.0–17.0)
Immature Granulocytes: 2 %
Lymphocytes Relative: 34 %
Lymphs Abs: 6.1 10*3/uL — ABNORMAL HIGH (ref 0.7–4.0)
MCH: 26.7 pg (ref 26.0–34.0)
MCHC: 31.8 g/dL (ref 30.0–36.0)
MCV: 84 fL (ref 80.0–100.0)
Monocytes Absolute: 0.7 10*3/uL (ref 0.1–1.0)
Monocytes Relative: 4 %
Neutro Abs: 10.9 10*3/uL — ABNORMAL HIGH (ref 1.7–7.7)
Neutrophils Relative %: 60 %
Platelets: 402 10*3/uL — ABNORMAL HIGH (ref 150–400)
RBC: 3.07 MIL/uL — ABNORMAL LOW (ref 4.22–5.81)
RDW: 15.9 % — ABNORMAL HIGH (ref 11.5–15.5)
WBC: 18.1 10*3/uL — ABNORMAL HIGH (ref 4.0–10.5)
nRBC: 0.7 % — ABNORMAL HIGH (ref 0.0–0.2)

## 2020-05-18 LAB — GLUCOSE, CAPILLARY
Glucose-Capillary: 223 mg/dL — ABNORMAL HIGH (ref 70–99)
Glucose-Capillary: 260 mg/dL — ABNORMAL HIGH (ref 70–99)
Glucose-Capillary: 250 mg/dL — ABNORMAL HIGH (ref 70–99)
Glucose-Capillary: 197 mg/dL — ABNORMAL HIGH (ref 70–99)
Glucose-Capillary: 245 mg/dL — ABNORMAL HIGH (ref 70–99)
Glucose-Capillary: 258 mg/dL — ABNORMAL HIGH (ref 70–99)

## 2020-05-18 LAB — COMPREHENSIVE METABOLIC PANEL
ALT: 25 U/L (ref 0–44)
AST: 81 U/L — ABNORMAL HIGH (ref 15–41)
Albumin: <1 g/dL — ABNORMAL LOW (ref 3.5–5.0)
Alkaline Phosphatase: 50 U/L (ref 38–126)
Anion gap: 14 (ref 5–15)
BUN: 78 mg/dL — ABNORMAL HIGH (ref 8–23)
CO2: 20 mmol/L — ABNORMAL LOW (ref 22–32)
Calcium: 7 mg/dL — ABNORMAL LOW (ref 8.9–10.3)
Chloride: 117 mmol/L — ABNORMAL HIGH (ref 98–111)
Creatinine, Ser: 3.04 mg/dL — ABNORMAL HIGH (ref 0.61–1.24)
GFR calc Af Amer: 22 mL/min — ABNORMAL LOW (ref >60)
GFR calc non Af Amer: 19 mL/min — ABNORMAL LOW (ref >60)
Glucose, Bld: 274 mg/dL — ABNORMAL HIGH (ref 70–99)
Potassium: 4.4 mmol/L (ref 3.5–5.1)
Sodium: 151 mmol/L — ABNORMAL HIGH (ref 135–145)
Total Bilirubin: 1.1 mg/dL (ref 0.3–1.2)
Total Protein: 5.2 g/dL — ABNORMAL LOW (ref 6.5–8.1)

## 2020-05-18 LAB — PHOSPHORUS: Phosphorus: 4.8 mg/dL — ABNORMAL HIGH (ref 2.5–4.6)

## 2020-05-18 LAB — MAGNESIUM: Magnesium: 2.4 mg/dL (ref 1.7–2.4)

## 2020-05-18 LAB — PATHOLOGIST SMEAR REVIEW

## 2020-05-18 LAB — TRIGLYCERIDES: Triglycerides: 184 mg/dL — ABNORMAL HIGH (ref <150)

## 2020-05-18 MED ORDER — FREE WATER
200.0000 mL | Freq: Three times a day (TID) | Status: DC
  Administered 2020-05-18: 200 mL

## 2020-05-18 MED ORDER — FENTANYL CITRATE (PF) 100 MCG/2ML IJ SOLN
50.0000 ug | INTRAMUSCULAR | Status: DC | PRN
  Administered 2020-05-18 (×2): 50 ug via INTRAVENOUS
  Filled 2020-05-18 (×2): qty 2

## 2020-05-18 MED ORDER — DIATRIZOATE MEGLUMINE & SODIUM 66-10 % PO SOLN
90.0000 mL | Freq: Once | ORAL | Status: AC
  Administered 2020-05-18: 90 mL
  Filled 2020-05-18: qty 90

## 2020-05-18 MED ORDER — INSULIN DETEMIR 100 UNIT/ML ~~LOC~~ SOLN
25.0000 [IU] | Freq: Two times a day (BID) | SUBCUTANEOUS | Status: DC
  Administered 2020-05-18 – 2020-05-19 (×2): 25 [IU] via SUBCUTANEOUS
  Filled 2020-05-18 (×3): qty 0.25

## 2020-05-18 MED ORDER — PIPERACILLIN-TAZOBACTAM 3.375 G IVPB
3.3750 g | Freq: Three times a day (TID) | INTRAVENOUS | Status: DC
  Administered 2020-05-18 – 2020-05-20 (×6): 3.375 g via INTRAVENOUS
  Filled 2020-05-18 (×6): qty 50

## 2020-05-18 MED ORDER — FUROSEMIDE 10 MG/ML IJ SOLN
60.0000 mg | Freq: Once | INTRAMUSCULAR | Status: AC
  Administered 2020-05-18: 60 mg via INTRAVENOUS
  Filled 2020-05-18: qty 6

## 2020-05-18 MED ORDER — INSULIN DETEMIR 100 UNIT/ML ~~LOC~~ SOLN: 25.0000 [IU] | Freq: Two times a day (BID) | SUBCUTANEOUS | Status: DC

## 2020-05-18 MED ORDER — METOCLOPRAMIDE HCL 5 MG/ML IJ SOLN
10.0000 mg | Freq: Four times a day (QID) | INTRAMUSCULAR | Status: AC
  Administered 2020-05-18 – 2020-05-20 (×9): 10 mg via INTRAVENOUS
  Filled 2020-05-18 (×9): qty 2

## 2020-05-18 MED ORDER — TRAVASOL 10 % IV SOLN
INTRAVENOUS | Status: AC
  Filled 2020-05-18: qty 658.8

## 2020-05-18 MED ORDER — DEXMEDETOMIDINE HCL IN NACL 400 MCG/100ML IV SOLN
0.0000 ug/kg/h | INTRAVENOUS | Status: AC
  Administered 2020-05-18: 0.4 ug/kg/h via INTRAVENOUS
  Administered 2020-05-18: 0.9 ug/kg/h via INTRAVENOUS
  Administered 2020-05-19: 0.6 ug/kg/h via INTRAVENOUS
  Administered 2020-05-19: 0.8 ug/kg/h via INTRAVENOUS
  Administered 2020-05-19: 0.7 ug/kg/h via INTRAVENOUS
  Administered 2020-05-19 (×2): 0.9 ug/kg/h via INTRAVENOUS
  Administered 2020-05-20 (×2): 0.6 ug/kg/h via INTRAVENOUS
  Administered 2020-05-20 – 2020-05-21 (×2): 0.4 ug/kg/h via INTRAVENOUS
  Filled 2020-05-18 (×7): qty 100
  Filled 2020-05-18: qty 200
  Filled 2020-05-18 (×5): qty 100

## 2020-05-18 MED ORDER — HYDROMORPHONE HCL 1 MG/ML IJ SOLN
2.0000 mg | INTRAMUSCULAR | Status: DC | PRN
  Administered 2020-05-18 – 2020-05-20 (×12): 2 mg via INTRAVENOUS
  Administered 2020-05-20: 1 mg via INTRAVENOUS
  Administered 2020-05-20 (×2): 2 mg via INTRAVENOUS
  Filled 2020-05-18 (×16): qty 2

## 2020-05-18 MED ORDER — KETAMINE HCL-SODIUM CHLORIDE 100-0.9 MG/10ML-% IV SOSY
50.0000 mg | PREFILLED_SYRINGE | Freq: Once | INTRAVENOUS | Status: AC
  Administered 2020-05-18: 50 mg via INTRAVENOUS
  Filled 2020-05-18: qty 10

## 2020-05-18 MED ORDER — INSULIN ASPART 100 UNIT/ML ~~LOC~~ SOLN
0.0000 [IU] | SUBCUTANEOUS | Status: DC
  Administered 2020-05-18: 5 [IU] via SUBCUTANEOUS
  Administered 2020-05-18: 3 [IU] via SUBCUTANEOUS
  Administered 2020-05-18 (×2): 8 [IU] via SUBCUTANEOUS
  Administered 2020-05-18 – 2020-05-19 (×2): 5 [IU] via SUBCUTANEOUS

## 2020-05-18 MED ORDER — PIVOT 1.5 CAL PO LIQD
1000.0000 mL | ORAL | Status: DC
  Administered 2020-05-18: 1000 mL

## 2020-05-18 MED ORDER — INSULIN DETEMIR 100 UNIT/ML ~~LOC~~ SOLN
35.0000 [IU] | Freq: Two times a day (BID) | SUBCUTANEOUS | Status: DC
  Filled 2020-05-18: qty 0.35

## 2020-05-18 MED ORDER — KETAMINE HCL-SODIUM CHLORIDE 100-0.9 MG/10ML-% IV SOSY
0.5000 mg/kg | PREFILLED_SYRINGE | Freq: Once | INTRAVENOUS | Status: DC
  Filled 2020-05-18: qty 10

## 2020-05-18 NOTE — Progress Notes (Signed)
Patient ID: MYER BOHLMAN, male   DOB: Nov 01, 1946, 74 y.o.   MRN: [id number] Patient having increasing abdominal pain with irrigation/ trickle feeds through J- tube. Gastrografin administered - plain films shows all of the contrast is within the small bowel with no sign of leak.  Will hold off feeds until tomorrow.  Reglan IV to assist with motility.  Wilmon Arms. Corliss Skains, MD, [facility] Surgery  General/ Trauma Surgery   05/18/2020 4:15 PM

## 2020-05-18 NOTE — Consult Note (Addendum)
[address]       [place] [address]             [phone]        Azeez E Kirkpatrick Knott Medical Record #[id number] Date of Birth: [birth year]  Referring: Dr. Georgette Dover [facility]: [facility] Cardiologist:Peter Johnsie Cancel, MD  Chief Complaint:    Chief Complaint  Patient presents with  . Abdominal Pain  . SVT    History of Present Illness:      Ethan Johnson  is s/p partial gastrectomy, gastrojejunostomy tube placement by Dr. Bobbye Morton 5/21,  S/p exp lap/ left thoracotomy/ resection of GE junction and distal esophagus for ongoing necrosis  by Dr. Kae Heller Dr. Orvan Seen 04/30/2020, and S/p revision of gastrostomy tube/ placement of feeding jejunostomy tube/ closure of abdominal wall/ placmenet of wound VAC by Dr. Georgette Dover 05/21/2020. He is currently intubated and sedation has been turned off in plans for extubation today. Current the patient has a chest tube placed on the left and we are consulted for chest tube management.    Current Activity/ Functional Status: intubated   Patient is intubated and sedation is off   Past Medical History:  Diagnosis Date  . Adrenal incidentaloma ([place])   . Aortic atherosclerosis ([place])   . BPH (benign prostatic hyperplasia)   . Diabetes mellitus without complication ([place])   . Hepatic steatosis   . Neuropathy     Past Surgical History:  Procedure Laterality Date  . APPLICATION OF WOUND VAC N/A 04/28/2020   Procedure: Application Of Abthera Wound Vac;  Surgeon: Clovis Riley, MD;  Location: [facility];  Service: General;  Laterality: N/A;  . ESOPHAGOGASTRODUODENOSCOPY (EGD) WITH PROPOFOL N/A 05/13/2020   Procedure: ESOPHAGOGASTRODUODENOSCOPY (EGD) WITH PROPOFOL;  Surgeon: Milus Banister, MD;  Location: [facility] ENDOSCOPY;  Service: Endoscopy;  Laterality: N/A;  . GASTROSTOMY N/A 05/14/2020   Procedure: REMOVE OF JEJUNOSTOMY TUBE, INSERTATION OF JEJUNOSTOMY AND GASTROSTOMY TUBE  AND ABDOMINAL VAC CHANGE;  Surgeon: Donnie Mesa, MD;  Location: [facility];  Service: General;  Laterality: N/A;  . LAPAROTOMY N/A 04/28/2020   Procedure: SUBTOTAL GASTRECTOMY, JEJUNOSTOMY TUBE;  Surgeon: Jesusita Oka, MD;  Location: [facility];  Service: General;  Laterality: N/A;  . LAPAROTOMY N/A 04/26/2020   Procedure: EXPLORATORY LAPAROTOMY;  Surgeon: Clovis Riley, MD;  Location: [facility];  Service: General;  Laterality: N/A;  . LAPAROTOMY N/A 04/28/2020   Procedure: EXPLORATORY LAPAROTOMY;  Surgeon: Donnie Mesa, MD;  Location: [facility];  Service: General;  Laterality: N/A;  . VIDEO ASSISTED THORACOSCOPY (VATS)/THOROCOTOMY Left 04/27/2020   Procedure: Video Assisted Thoracoscopy (Vats)/Thorocotomy;  Surgeon: Wonda Olds, MD;  Location: [facility] OR;  Service: Thoracic;  Laterality: Left;    Social History   Tobacco Use  Smoking Status Former Smoker  Smokeless Tobacco Never Used  Tobacco Comment   Smoked age [age]    Social History   Substance and Sexual Activity  Alcohol Use No     No Known Allergies  Current Facility-Administered Medications  Medication Dose Route Frequency Provider Last Rate Last Admin  . 0.9 %  sodium chloride infusion (Manually program via Guardrails IV Fluids)   Intravenous Once Jill Alexanders, PA-C   Stopped at 04/27/2020 1505  . 0.9 %  sodium chloride infusion   Intravenous Continuous Jill Alexanders, PA-C 10 mL/hr at 05/13/2020 1221 Restarted at 05/13/2020 1221  . 0.9 %  sodium chloride infusion   Intravenous PRN Simaan,  Darci Current, PA-C   Stopped at 05/16/20 [phone]  . acetaminophen (OFIRMEV) IV 1,000 mg  1,000 mg Intravenous QID PRN Jill Alexanders, PA-C   Stopped at 05/18/20 0549  . chlorhexidine gluconate (MEDLINE KIT) (PERIDEX) 0.12 % solution 15 mL  15 mL Mouth Rinse BID Jill Alexanders, PA-C   15 mL at 05/18/20 6295  . Chlorhexidine Gluconate Cloth 2 % PADS 6 each  6 each Topical Q0600 Jill Alexanders, PA-C   6 each at 05/18/20 0424  . dexmedetomidine (PRECEDEX) 400 MCG/100ML (4  mcg/mL) infusion  0-1.2 mcg/kg/hr Intravenous Continuous Jill Alexanders, PA-C 22.4 mL/hr at 05/18/20 1000 0.8 mcg/kg/hr at 05/18/20 1000  . feeding supplement (PIVOT 1.5 CAL) liquid 1,000 mL  1,000 mL Per Tube Q24H Agarwala, Ravi, MD      . fentaNYL (SUBLIMAZE) bolus via infusion 25 mcg  25 mcg Intravenous Q15 min PRN Jill Alexanders, PA-C   25 mcg at 05/16/20 0440  . fentaNYL 253mg in NS 2589m(1060mml) infusion-PREMIX  25-200 mcg/hr Intravenous Continuous SimJill AlexandersA-C 15 mL/hr at 05/18/20 1000 150 mcg/hr at 05/18/20 1000  . fluconazole (DIFLUCAN) IVPB 200 mg  200 mg Intravenous Q24H SimJill AlexandersA-C 100 mL/hr at 05/18/20 0014 200 mg at 05/18/20 0014  . free water 200 mL  200 mL Per Tube Q8H Desai, Rahul P, PA-C   200 mL at 05/18/20 0837  . heparin injection 5,000 Units  5,000 Units Subcutaneous Q8H SimJill AlexandersA-C   5,000 Units at 05/18/20 0536  . HYDROmorphone (DILAUDID) injection 1 mg  1 mg Intravenous Q2H PRN SimJill AlexandersA-C   1 mg at 05/01/2020 [id number] insulin aspart (novoLOG) injection 0-15 Units  0-15 Units Subcutaneous Q4H Desai, Rahul P, PA-C   8 Units at 05/18/20 0836  . insulin detemir (LEVEMIR) injection 25 Units  25 Units Subcutaneous BID Agarwala, Ravi, MD      . labetalol (NORMODYNE) injection 10 mg  10 mg Intravenous Q10 min PRN SimJill AlexandersA-C   10 mg at 05/14/20 2307  . lactated ringers infusion   Intravenous Continuous SimJill AlexandersA-C 10 mL/hr at 05/18/20 1148 New Bag at 05/18/20 1148  . lip balm (CARMEX) ointment   Topical PRN SimJill AlexandersA-C      . MEDLINE mouth rinse  15 mL Mouth Rinse 10 times per day SimJill AlexandersA-C   15 mL at 05/18/20 1149  . methocarbamol (ROBAXIN) 500 mg in dextrose 5 % 50 mL IVPB  500 mg Intravenous Q6H SimJill AlexandersA-C   Stopped at 05/18/20 [id number] pantoprazole (PROTONIX) injection 40 mg  40 mg Intravenous Q12H SimJill AlexandersA-C   40 mg at 05/18/20  0920  . piperacillin-tazobactam (ZOSYN) IVPB 3.375 g  3.375 g Intravenous Q8H Naik, Kushal D, RPH      . sodium chloride flush (NS) 0.9 % injection 10-40 mL  10-40 mL Intracatheter Q12H SimJill AlexandersA-C   10 mL at 05/18/20 0920  . sodium chloride flush (NS) 0.9 % injection 10-40 mL  10-40 mL Intracatheter PRN Simaan, EliDarci CurrentA-C      . TPN ADULT (ION)   Intravenous Continuous TPN SimJill AlexandersA-C 45 mL/hr at 05/18/20 1000 Rate Verify at 05/18/20 1000  . TPN ADULT (ION)   Intravenous Continuous TPN von Dohlen, Haley B, RPH        Medications Prior to Admission  Medication Sig  Dispense Refill Last Dose  . amitriptyline (ELAVIL) 10 MG tablet Take 10 mg by mouth at bedtime.   05/05/2020 at Unknown time  . aspirin 81 MG chewable tablet Chew 81 mg by mouth daily.   05/05/2020 at Unknown time  . atorvastatin (LIPITOR) 40 MG tablet Take 40 mg by mouth at bedtime.   05/05/2020 at Unknown time  . ferrous sulfate 325 (65 FE) MG tablet Take 325 mg by mouth daily with breakfast.   05/05/2020 at Unknown time  . folic acid (FOLVITE) 1 MG tablet Take 1 mg by mouth daily.   05/05/2020 at Unknown time  . glipiZIDE (GLUCOTROL) 10 MG tablet Take 10 mg by mouth 2 (two) times daily before a meal.    05/05/2020 at Unknown time  . metFORMIN (GLUCOPHAGE) 1000 MG tablet Take 1,000 mg by mouth 2 (two) times daily with a meal.   05/05/2020 at Unknown time  . prazosin (MINIPRESS) 1 MG capsule Take 1 mg by mouth at bedtime.   05/05/2020 at Unknown time  . tamsulosin (FLOMAX) 0.4 MG CAPS capsule Take 0.4 mg by mouth daily.   05/05/2020 at Unknown time  . venlafaxine XR (EFFEXOR-XR) 75 MG 24 hr capsule Take 225 mg by mouth daily with breakfast.   05/05/2020 at Unknown time  . methocarbamol (ROBAXIN) 500 MG tablet Take 1 tablet (500 mg total) by mouth at bedtime and may repeat dose one time if needed. (Patient not taking: Reported on 05/01/2020) 10 tablet 0 Completed Course at Unknown time    Family History   Problem Relation Age of Onset  . CAD Neg Hx      Review of Systems:   ROS Pertinent items are noted in HPI.    Physical Exam: BP (!) 121/49   Pulse 86   Temp (!) 100.8 F (38.2 C) (Axillary)   Resp (!) 24   Ht _0  (1.778 m)   Wt 114 kg   SpO2 99%   BMI 36.06 kg/m    General appearance: intubated, responsive to command Cardio: regular rate and rhythm, S1, S2 normal, no murmur, click, rub or gallop GI: wound vac in place, J tube Extremities: 2-3 pitting edema in upper and lower ext Neurologic: responsive to commands  Diagnostic Studies & Laboratory data:     Recent Radiology Findings:   DG CHEST PORT 1 VIEW  Result Date: 05/18/2020 CLINICAL DATA:  74 year old male on mechanical ventilation. EXAM: PORTABLE CHEST 1 VIEW COMPARISON:  Chest x-ray 05/16/2020. FINDINGS: An endotracheal tube is in place with tip 1.0 cm above the carina. Nasogastric tube the distal esophagus in position with tip projecting over at least 4 cm above the gastroesophageal junction. There is a left-sided internal jugular central venous catheter with tip terminating in the proximal superior vena cava. Three left-sided chest tubes remain in position with tips projecting over the left upper and lower hemithorax. Skin staples projecting over the lower left hemithorax. Lung volumes are low. Bibasilar opacities (left greater than right) which likely reflect areas of atelectasis. Probable small left pleural effusion. No appreciable pneumothorax. No evidence of pulmonary edema. Heart size is normal. Upper mediastinal contours are within normal limits. IMPRESSION: 1. Support apparatus, as above. Advancement of the nasogastric tube at least 15 cm for more optimal placement is again recommended. 2. Low lung volumes with persistent bibasilar areas of probable subsegmental atelectasis and probable small left pleural effusion. Electronically Signed   By: Vinnie Langton M.D.   On: 05/18/2020 10:00  I have  independently reviewed the above radiologic studies and discussed with the patient   Recent Lab Findings: Lab Results  Component Value Date   WBC 18.1 (H) 05/18/2020   HGB 8.2 (L) 05/18/2020   HCT 25.8 (L) 05/18/2020   PLT 402 (H) 05/18/2020   GLUCOSE 274 (H) 05/18/2020   CHOL 65 05/07/2020   TRIG 184 (H) 05/18/2020   HDL <10 (L) 05/07/2020   LDLCALC NOT CALCULATED 05/07/2020   ALT 25 05/18/2020   AST 81 (H) 05/18/2020   NA 151 (H) 05/18/2020   K 4.4 05/18/2020   CL 117 (H) 05/18/2020   CREATININE 3.04 (H) 05/18/2020   BUN 78 (H) 05/18/2020   CO2 20 (L) 05/18/2020   TSH 2.236 05/21/2020   INR 1.02 07/30/2014   HGBA1C 7.6 (H) 04/27/2020    Acute Kidney Injury (any one)  Increase in SCr by > 0.3 within 48 hours  Increase SCr to > 1.5 times baseline  Urine volume < 0.5 ml/kg/h for 6 hrs  ?Stage 1 - Increase in serum creatinine to 1.5 to 1.9 times baseline, or increase in serum creatinine by ?0.3 mg/dL (?26.5 micromol/L), or reduction in urine output to <0.5 mL/kg per hour for 6 to 12 hours.  ?Stage 2 - Increase in serum creatinine to 2.0 to 2.9 times baseline, or reduction in urine output to <0.5 mL/kg per hour for ?12 hours.  ?Stage 3 - Increase in serum creatinine to 3.0 times baseline, or increase in serum creatinine to ?4.0 mg/dL (?353.6 micromol/L), or reduction in urine output to <0.3 mL/kg per hour for ?24 hours, or anuria for ?12 hours, or the initiation of renal replacement therapy, or, in patients <18 years, decrease in eGFR to <35 mL   Lab Results  Component Value Date   CREATININE 3.04 (H) 05/18/2020   Estimated Creatinine Clearance: 27 mL/min (A) (by C-G formula based on SCr of 3.04 mg/dL (H)).   Assessment / Plan:    1.  Keep chest tube to suction after extubation, Leave NG tube in  Avoid central lines in the left neck-hopefully can be pulled soon 2. Chest tube drainage: 145cc/24 hours, continue to document drainage. No air leak.  3. On vent support - ccm  suggested weaning and extubation   4. AKI stage 3 , cr 3.01 today    Plan:  Consider Cervical Esophageal diversion when ready to remove NG tube- will need staged poss colon interposition to reestablish bowel continuity , but this will be delayed possible 3-6 months after current acute intervention. Would not attempt here, referral  to quaternary referral service/ GI surgery and thoracic surgery.   I have seen and examined Harriett Sine and have formulated the above  assessment  and plan.  Grace Isaac MD [facility] [phone] Office [phone] 05/18/2020 2:03 PM

## 2020-05-18 NOTE — Progress Notes (Addendum)
NAME:  Ethan Johnson, MRN:  [id number], DOB:  [birth year], LOS: 12 ADMISSION DATE:  04/24/2020, CONSULTATION DATE:  05/02/2020 REFERRING MD:  Janee Morn - Trauma, CHIEF COMPLAINT:  Gastric infarction.   HPI/course in hospital  74 year old man who originally presented 6 days ago with abdominal pain and was found to have urinary retention in the context of worsening symptoms of prostatism and an elevated PSA. Symptoms improved following insertion of Foley and drainage of 1.5L of urine.  SVT at that time which was felt to be sinus tachycardia as no conversion with adenosine. Hypotension and tachycardia improved initially with relief of obstruction.   Found to have Klebsiella bacteremia which was treated with ceftriaxone. Transitioned to orals and was to be discharged with Foley in place for outpatient urology evaluation.  Noted to have gastric distension either due to outlet obstruction or gastroparesis, exacerbated by acute illness.   5/19 developed sudden abdominal pain with tachypnea and desaturation. Loose dark stools.   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   5/20 OR  -  Subtotal gastrectomy with placement of jejunostomy tube. Normotensive throughout the case, minimal blood loss, fluids given.  Returned to ICU intubated.  5/23 OR - Purulent ascites was drained from the abdomen.:  Small bowel all up.  Viable.  Gastrojejunostomy was in good position.  Source was felt to be dehiscence of the proximal staple line at the level of the esophagus.  Patient then underwent thoracotomy and more proximal esophagectomy.  Plan to take patient back tomorrow for further washout.   5/24 OR for ex lap, revision of GJ tube to G tube, placement of feeding jejunostomy, closure of abdominal wall, placement of wound vac.  Past Medical History   Past Medical History:  Diagnosis Date  . Adrenal incidentaloma ([facility])   . Aortic atherosclerosis ([facility])   . BPH (benign prostatic hyperplasia)    . Diabetes mellitus without complication ([facility])   . Hepatic steatosis   . Neuropathy      Past Surgical History:  Procedure Laterality Date  . APPLICATION OF WOUND VAC N/A 05/03/2020   Procedure: Application Of Abthera Wound Vac;  Surgeon: Berna Bue, MD;  Location: [facility] OR;  Service: General;  Laterality: N/A;  . ESOPHAGOGASTRODUODENOSCOPY (EGD) WITH PROPOFOL N/A 05/20/2020   Procedure: ESOPHAGOGASTRODUODENOSCOPY (EGD) WITH PROPOFOL;  Surgeon: Rachael Fee, MD;  Location: [facility] ENDOSCOPY;  Service: Endoscopy;  Laterality: N/A;  . LAPAROTOMY N/A 05/13/2020   Procedure: SUBTOTAL GASTRECTOMY, JEJUNOSTOMY TUBE;  Surgeon: Diamantina Monks, MD;  Location: [facility];  Service: General;  Laterality: N/A;  . LAPAROTOMY N/A 05/03/2020   Procedure: EXPLORATORY LAPAROTOMY;  Surgeon: Berna Bue, MD;  Location: [facility];  Service: General;  Laterality: N/A;  . VIDEO ASSISTED THORACOSCOPY (VATS)/THOROCOTOMY Left 05/11/2020   Procedure: Video Assisted Thoracoscopy (Vats)/Thorocotomy;  Surgeon: Linden Dolin, MD;  Location: [facility];  Service: Thoracic;  Laterality: Left;     Interim history/subjective:  Started PSV at 5/5 this AM, tolerating well.  Objective   Blood pressure (!) 93/46, pulse 83, temperature (!) 101.1 F (38.4 C), temperature source Oral, resp. rate 18, height 5\' 10"  (1.778 m), weight 114 kg, SpO2 100 %. CVP:  [10 mmHg-16 mmHg] 16 mmHg  Vent Mode: PSV;CPAP FiO2 (%):  [40 %] 40 % Set Rate:  [16 bmp] 16 bmp Vt Set:  [580 mL] 580 mL PEEP:  [5 cmH20] 5 cmH20 Pressure Support:  [5 cmH20] 5 cmH20 Plateau Pressure:  [  16 cmH20-24 cmH20] 24 cmH20   Intake/Output Summary (Last 24 hours) at 05/18/2020 0800 Last data filed at 05/18/2020 0600 Gross per 24 hour  Intake 2523.6 ml  Output 3245 ml  Net -721.4 ml   Filed Weights   05/16/20 0500 05/14/2020 0500 05/18/20 0500  Weight: 112 kg 112.5 kg 114 kg    Examination: General: Adult male, resting in bed, in NAD. Neuro: Sedated but  opens eyes and follows basic commands. HEENT: [place]/AT. Sclerae anicteric. ETT in place. Cardiovascular: RRR, no M/R/G.  Lungs: Left chest tube with no leak.  Respirations even and unlabored.  CTA bilaterally, No W/R/R. Abdomen: LUQ G tube drain in place.  RUQ jejunostomy.  BS x 4, soft, NT/ND.  Musculoskeletal: No gross deformities, no edema.  Skin: Intact, warm, no rashes.  Assessment & Plan:   Remains critically ill due to expected acute hypoxic respiratory failure following partial gastrectomy. - Analgesia for upper abdominal incision.  - At increased risk of post-operative pulmonary complications given body habitus and location of incision.  -Continue PSV weans.  Now that abdomen is closed and no planned further surgical procedures, can wean aggressively and move towards hopeful extubation in next day or two.  Critically ill due to hypovolemic and distributive shock requiring titration of vasopressors and fluid administration. Now resolved. - follow CVP but clinically hypervolemic and intravascularly volume replete.   AKI from critical illness - resolving.  Mild hypernatremia. - Start free water per tube. - Follow BMP.  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 S/p left chest tube placement following left thoracotomy intraop (Dr. Orvan Seen). - Post op care per surgery. - Chest tube management per TCTS. - per surgery, RUQ G tube is only for drainage (has very small portion of stomach left).  LUQ J tube can be used for feeding/meds/etc. - Leave naso-esophageal tube in place post extubation, this will help with preventing mucus buildup in remaining portion of esophagus.  He will eventually need "spit fistula" before definitive surgical plan down the road (likely at [facility] or [facility]).   Urinary retention due to BPH - Leave Foley in place.  DM. - SSI and levemir.  Daily Goals Checklist  Pain/Anxiety/Delirium protocol (if  indicated): Fentanyl and precedex infusions.  VAP protocol (if indicated): Bundle in place DVT prophylaxis: Heparin TID Nutritional status and feeding goals: TPN GI prophylaxis: PPI Glucose control: SSI and levemir Mobility/therapy needs: bedrest for now Code Status: full  Family Communication: Will call family. Disposition: ICU   CC time: 35 min.  Montey Hora, [facility] 05/18/2020, 8:14 AM

## 2020-05-18 NOTE — Progress Notes (Signed)
Inpatient Diabetes Program Recommendations  AACE/ADA: New Consensus Statement on Inpatient Glycemic Control (2015)  Target Ranges:  Prepandial:   less than 140 mg/dL      Peak postprandial:   less than 180 mg/dL (1-2 hours)      Critically ill patients:  140 - 180 mg/dL   Lab Results  Component Value Date   GLUCAP 260 (H) 05/18/2020   HGBA1C 7.6 (H) 05/22/2020    Review of Glycemic Control Results for Ethan Johnson, Ethan Johnson (MRN [id number]) as of 05/18/2020 11:01  Ref. Range 05/20/2020 15:33 05/08/2020 19:26 04/27/2020 23:25 05/18/2020 03:26 05/18/2020 08:18  Glucose-Capillary Latest Ref Range: 70 - 99 mg/dL 262 (H) 854 (H) 965 (H) 223 (H) 260 (H)   Diabetes history:  DM2  Outpatient Diabetes medications:  Glipizide 10 mg bid  Metformin 1000 mg bid  Current orders for Inpatient glycemic control:  Levemir 15 units bid Novolog 0-15 units q4h Pivot 1.5 cal per J-tube @ 53ml/hr-to start today @ 11am   Inpatient Diabetes Program Recommendations:     Increase lantus to 20 units daily May need to add tube feed coverage once tube feeds start.  Will continue to follow.  Thank you, Dulce Sellar, RN, BSN Diabetes Coordinator Inpatient Diabetes Program [phone] (team pager from 8a-5p)

## 2020-05-18 NOTE — Progress Notes (Signed)
1 Day Post-Op   Subjective/Chief Complaint: Remains intubated, sedated Not requiring any pressors No BM in several days Minimal JP output Chest tubes 175 cc output   Objective: Vital signs in last 24 hours: Temp:  [98.8 F (37.1 C)-101.1 F (38.4 C)] 100.8 F (38.2 C) (05/26 0800) Pulse Rate:  [83-92] 83 (05/26 0750) Resp:  [17-25] 18 (05/26 0750) BP: (93-168)/(46-60) 107/59 (05/26 0800) SpO2:  [95 %-100 %] 100 % (05/26 0750) Arterial Line BP: (82-131)/(45-59) 113/51 (05/26 0800) FiO2 (%):  [40 %] 40 % (05/26 0750) Weight:  [409 kg] 114 kg (05/26 0500) Last BM Date: 05/18/2020  Intake/Output from previous day: 05/25 0701 - 05/26 0700 In: 2607 [I.V.:1808.8; Blood:315; IV Piggyback:483.1] Out: 8119 [Urine:2850; Drains:200; Blood:50; Chest Tube:145] Intake/Output this shift: Total I/O In: 400.6 [I.V.:165.1; NG/GT:200; IV Piggyback:35.4] Out: 275 [Urine:275]  Ill-appearing, intubated, sedated Arousable Chest tubes - serosanguinous output G-tube - intact; no bleeding; some bilious output J-tube - intact; no bleeding at the site Midline wound VAC with good seal Distended; quiet abdomen  Lab Results:  Recent Labs    05/13/2020 1811 05/18/20 0540  WBC 19.0* 18.1*  HGB 8.6* 8.2*  HCT 27.1* 25.8*  PLT 400 402*   BMET Recent Labs    05/11/2020 0508 05/22/2020 0508 04/28/2020 1302 05/18/20 0540  NA 150*   < > 151* 151*  K 4.2   < > 4.4 4.4  CL 115*  --   --  117*  CO2 22  --   --  20*  GLUCOSE 275*  --   --  274*  BUN 74*  --   --  78*  CREATININE 3.53*  --   --  3.04*  CALCIUM 6.3*  --   --  7.0*   < > = values in this interval not displayed.   PT/INR No results for input(s): LABPROT, INR in the last 72 hours. ABG Recent Labs    Jun 11, 2020 1417 04/24/2020 1302  PHART 7.373 7.405  HCO3 26.4 25.6    Studies/Results: DG CHEST PORT 1 VIEW  Result Date: 05/16/2020 CLINICAL DATA:  Chest tube, pneumothorax EXAM: PORTABLE CHEST 1 VIEW COMPARISON:  Portable exam 0918  hours compared to 2020/06/11 FINDINGS: Tip of endotracheal tube projects 1.9 cm above carina. Tip of nasogastric tube projects over mid to distal esophagus, recommend advancing tube 15 cm. Three LEFT thoracostomy tubes unchanged LEFT jugular central venous catheter with tip projecting over SVC. Normal heart size, mediastinal contours, and pulmonary vascularity. Bibasilar atelectasis. No pleural effusion or pneumothorax. IMPRESSION: Bibasilar atelectasis. Three LEFT thoracostomy tubes without pneumothorax. Recommend advancing nasogastric tube 15 cm to place within stomach. Findings called to Wynona Meals RN on 05/16/2020 at 0934 hours. Electronically Signed   By: Lavonia Dana M.D.   On: 05/16/2020 09:35   [place] EKG SITE RITE  Result Date: 05/16/2020 If Site Rite image not attached, placement could not be confirmed due to current cardiac rhythm.   Anti-infectives: Anti-infectives (From admission, onward)   Start     Dose/Rate Route Frequency Ordered Stop   05/18/20 1400  piperacillin-tazobactam (ZOSYN) IVPB 3.375 g     3.375 g 12.5 mL/hr over 240 Minutes Intravenous Every 8 hours 05/18/20 0748     05/16/2020 2200  piperacillin-tazobactam (ZOSYN) IVPB 3.375 g  Status:  Discontinued     3.375 g 12.5 mL/hr over 240 Minutes Intravenous Every 12 hours 05/20/2020 1155 05/18/20 0748   05/16/20 2300  fluconazole (DIFLUCAN) IVPB 200 mg     200 mg  100 mL/hr over 60 Minutes Intravenous Every 24 hours 05/16/20 1247     05/16/20 2000  fluconazole (DIFLUCAN) IVPB 400 mg  Status:  Discontinued     400 mg 100 mL/hr over 120 Minutes Intravenous Every 24 hours 05/24/20 1936 05/16/20 1247   05-24-2020 2000  fluconazole (DIFLUCAN) IVPB 800 mg     800 mg 200 mL/hr over 120 Minutes Intravenous  Once May 24, 2020 1936 May 24, 2020 2339   2020/05/24 1945  fluconazole (DIFLUCAN) IVPB 100 mg  Status:  Discontinued    Note to Pharmacy: Pharmacy may change dose as needed  For intraabdominal infection/ florid leak from esophagogastric  junction   100 mg 50 mL/hr over 60 Minutes Intravenous Every 24 hours 05/24/2020 1931 05-24-2020 1936   24-May-2020 0600  piperacillin-tazobactam (ZOSYN) IVPB 3.375 g  Status:  Discontinued     3.375 g 12.5 mL/hr over 240 Minutes Intravenous Every 8 hours 05/24/2020 0544 04/27/2020 1155   05/10/20 0600  ceFAZolin (ANCEF) IVPB 2g/100 mL premix  Status:  Discontinued     2 g 200 mL/hr over 30 Minutes Intravenous Every 8 hours 05/09/20 1542 May 24, 2020 0544   05/07/20 1000  cefTRIAXone (ROCEPHIN) 2 g in sodium chloride 0.9 % 100 mL IVPB  Status:  Discontinued     2 g 200 mL/hr over 30 Minutes Intravenous Every 24 hours 05/05/2020 1036 05/09/20 1542   05/03/2020 0745  cefTRIAXone (ROCEPHIN) 2 g in sodium chloride 0.9 % 100 mL IVPB     2 g 200 mL/hr over 30 Minutes Intravenous  Once 05/05/2020 0730 04/30/2020 2956      Assessment/Plan: DM2  Adrenal incidentaloma Urinary retention enlarged prostate Klebsiella bacteremia  Acute hypoxic respiratory failure- resolved Gastric necrosis, gastric volvulus  S/p partial gastrectomy, gastrojejunostomy tube placement - Dr. Bedelia Person 5/21 S/p exp lap/ left thoracotomy/ resection of GE junction and distal esophagus for ongoing necrosis - Dr. Lenell Antu May 24, 2020 S/p revision of gastrostomy tube/ placement of feeding jejunostomy tube/ closure of abdominal wall/ placmenet of wound VAC - Dr. Corliss Skains 05/10/2020   - NO MEDS per G tube - straight drain only - Begin tube feeds via J-tube - as we reach goal rate, wean TNA -TCTS to plan spit fistula/ manage chest tube - Will eventually need referral to tertiary medical center for reconstruction possibly with colon interposition graft  FEN: NPO, IVF ID: Zosyn VTE: SCD's, SQH Foley: in place 2/2 prostatism with urosepsis   I spent about 45 minutes at the bedside speaking with the patient's wife and his son (by phone).  His son is Dr. Tarri Glenn, hospitalist at [facility].  LOS: 12 days    Wynona Luna 05/18/2020

## 2020-05-18 NOTE — Progress Notes (Signed)
Patient yelling out in pain after tube feeding started and free water flushes given. TF paused and FW held per CCM MD. CCS MD paged as well. He will put in a gastric study to be sure J tube is working. Both PRNs given for pain. Per MD nothing more ordered at this time. Cederic Mozley, [place]rape, RN

## 2020-05-18 NOTE — Progress Notes (Addendum)
PHARMACY - TOTAL PARENTERAL NUTRITION CONSULT NOTE   Indication: bowel necrosis  Patient Measurements: Height: 5\' 10"  (177.8 cm) Weight: 114 kg (251 lb 5.2 oz) IBW/kg (Calculated) : 73 TPN AdjBW (KG): 84.2 Body mass index is 36.06 kg/m.  Assessment: 40 yom originally presenting 5/14 with abdominal pain, urinary retention due to worsening symptoms of prostatism and elevated PSA. Symptoms improved s/p Foley. Klebsiella bacteremia treated with ceftriaxone. 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 Now s/p OR 5/20 for subtotal gastrectomy with placement of jejunostomy tube. Patient then underwent thoracotomy and more proximal esophagectomy. Plan back to OR 5/25. May also need further TCTS procedure. Holding TF. Pharmacy consulted to start TPN.  Patient now extubated 5/26 afternoon. Gastric study to ensure J tube working.   Glucose / Insulin: hx DM2, A1c 7.6. CBGs 206-274 on Levemir 15u BID + sSSI q4h (utilized 18 units in last 24 hrs) Electrolytes: Na 151 (stable - started on FW 04-07-1972 q8h), Cl 117 / CO2 20, corrected Ca WNL but iCa 0.76 (no redraw today, s/p Ca gluconate 4g IV x1 yesterday), Phos down to 4.8, others WNL Renal: AKI - SCr trend back down to 3.04 (1.06 on 5/18), BUN leveling off at 78; +5L this admit LFTs / TGs: AST down to 81, ALT wnl (noted on fluconazole), Tbili down to WNL, TG 184 (off propofol 5/24) Prealbumin / albumin: Prealbumin <5, albumin <1.0 Intake / Output; MIVF: G-tube with 568 ml/24 hrs bilious output; LBM 5/20; UOP improved to 1.1 ml/kg/hr GI Imaging: 5/19 CT abd - stomach distended by air, fluid, ingested contrast with unusual dependent air loculations of gastric fundus, high-grade gastric outlet obstruction, new bilateral pleural effusions 5/23 CT abd - gas  and free fluid in LU abd, scattered free air and moderate pneumoperitoneum, small volume ascites; post-surgical changes at upper range of normal for recent surgery Surgeries / Procedures: 5/21 partial gastrectomy, gastrojejunostomy tube placement 5/23 ex-lap, L thoracotomy/resection of GE junction and distal esophagus for ongoing necrosis, ab-thera vac placement 5/25 abd washout, attempt abdomen closure, revision of jejunostomy tube for nutrition and g-tube for gastric drainage  Central access: double lumen CVC, planning PICC 5/25 TPN start date: 04/23/2020 (TPN not hung 5/24 due to CL tip not in recommended area and PICC not yet placed)  Nutritional Goals (per RD recommendation on 5/21): kCal: 2500-2700, Protein: 150-170g, Fluid: >2L Goal TPN rate is 110 mL/hr (provides 161 g of protein and 2461 kcals per day)  Current Nutrition:  TPN  Start TF at 20 ml/hr 5/26 per General Surgery  Plan:  Continue TPN at 45 mL/hr at 1800 - will not advance rate today with CBGs remaining elevated >200. Titrate to goal as appropriate. -TPN will provide 66g protein, 130g CHO, 30g SMOF lipids, for 1006 total kCal, meeting 44% of protein and 40% of patient kCal needs. Electrolytes in TPN: reduce Na to 19mEq/L, continue reduced K 13mEq/L and Phos 48mmol/L with AKI, standard 2mEq/L of Ca and 88mEq/L of Mg; Cl:Ac ratio to max acetate Add standard MVI and trace elements to TPN. Remove chromium for now with AKI CCM increased Levemir to 25 [place] BID + change to moderate q4h SSI and adjust as needed  Monitor TPN labs on Mon/Thurs F/u toleration of tube feeds and ability to wean TPN   4m, PharmD, BCPS Please check  AMION for all [place] contact numbers Clinical Pharmacist 05/18/2020 7:40 AM

## 2020-05-18 NOTE — Procedures (Addendum)
Extubation Procedure Note  Patient Details:   Name: JACCOB CZAPLICKI DOB: 09-Mar-1946 MRN: [id number]   Airway Documentation:    Vent end date: 05/18/20 Vent end time: 1230   Evaluation  O2 sats: stable throughout Complications: Complications of tachypnea after extubation. 40-48 bpm. HR stable 90-100, SATs 95% on 5L [place]. RN aware. Patient did tolerate procedure well. Bilateral Breath Sounds: Diminished   Yes   Pt extubated per MD order to 5L [place]. Cuff leak heard prior to extubation by RN and RT. Pt became tachypnic between 40-48 bpm after removing the ETT. Pt voices that he is also in pain 10 out of 10. HR sustaining in 90s, SATs initially dropped to 83% but now 95% on 5L [place]. Pt is able to speak where he is and his wifes name at this time. RT will continue to monitor.   Dewain Penning T 05/18/2020, 12:39 PM

## 2020-05-18 NOTE — Progress Notes (Addendum)
Nutrition Follow-up  DOCUMENTATION CODES:   Obesity unspecified  INTERVENTION:   Initiate tube feeding via J-tube: Pivot 1.5 at 20 ml/h (480 ml per day)  200 ml free water every 8 hours  Total free water: 964 ml   Recommend advance TF as able to goal rate: Pivot 1.5 @ 65 ml/hr  30 ml Prostat BID  Provides: 2540 kcal, 176 grams protein, and 1184 ml free water.    TF to advance to goal as tolerated TPN to advance to goal as needed based on pt's tolerance of EN.   NUTRITION DIAGNOSIS:   Increased nutrient needs related to post-op healing as evidenced by estimated needs. Ongoing.   GOAL:   Patient will meet greater than or equal to 90% of their needs Progressing.   MONITOR:   TF tolerance  REASON FOR ASSESSMENT:   Consult Enteral/tube feeding initiation and management  ASSESSMENT:   Pt with PMH of DM, aortic atherosclerosis, BPH, hepatic steatosis, neuropathy, and adrenal incidentaloma admitted with abd pain x 1 week PTA, bladder outlet obstruction, sepsis, uremia/AKI from massively dilated prostate.   Pt discussed during ICU rounds and with RN.  Pt off pressors Per RN pt weaning, per surgery CCM can extubate when appropriate  TCTS planning for spit fistula; pt will need a colon interposition graft eventually.  TNA not advancing today due to elevated CBG's and initiation of trickle TF.   5/20 s/p partial gastrectomy, gastrojejunostomy tube placement with nasoesophageal tube, g-tube to LIWS, and J-tube.   5/23 ex-lap, L thoracotomy/resection of GE junction and distal esophagus for ongoing necrosis, ab-thera vac placement -- abdomen left open with further plans for surgery as able 5/24 TNA initiated due to prolonged bowel rest 5/25 s/p revision of gastrostomy tube/placement of feeding jejunostomy tube, closure of abd wall, placement of wound VAC 5/26 extubated    Medications reviewed and include: SSI, 25 units levemir BID Labs reviewed: Na 151 (H) CBG's:  223-260-250 VAC: 150 ml  JP: 50 ml  Y Chest tube: 145 ml NE tube to suction: no output documented G-tube to drain J-tube    TNA - @45  ml/h and provide: 1006 kcal, 66 grams protein, and 30 grams SMOF lipids  TF: Pivot 1.5 @ 20 ml/hr: Provides 720 kcal, 45 gm protein, 364 ml free water daily  Diet Order:   Diet Order            Diet NPO time specified  Diet effective now              EDUCATION NEEDS:   No education needs have been identified at this time  Skin:  Skin Assessment: Skin Integrity Issues: Skin Integrity Issues:: Incisions  Last BM:  5/20  Height:   Ht Readings from Last 1 Encounters:  05-28-20 5\' 10"  (1.778 m)    Weight:   Wt Readings from Last 1 Encounters:  05/18/20 114 kg    Ideal Body Weight:  75.4 kg  BMI:  Body mass index is 36.06 kg/m.  Estimated Nutritional Needs:   Kcal:  2500-2700  Protein:  150-170 grams  Fluid:  >2 L/day  ., RD, LDN, CNSC See AMiON for contact information '

## 2020-05-19 ENCOUNTER — Inpatient Hospital Stay (HOSPITAL_COMMUNITY): Payer: No Typology Code available for payment source

## 2020-05-19 DIAGNOSIS — J9601 Acute respiratory failure with hypoxia: Secondary | ICD-10-CM

## 2020-05-19 LAB — CBC WITH DIFFERENTIAL/PLATELET
Abs Immature Granulocytes: 0.2 10*3/uL — ABNORMAL HIGH (ref 0.00–0.07)
Basophils Absolute: 0 10*3/uL (ref 0.0–0.1)
Basophils Relative: 0 %
Eosinophils Absolute: 0 10*3/uL (ref 0.0–0.5)
Eosinophils Relative: 0 %
HCT: 25.3 % — ABNORMAL LOW (ref 39.0–52.0)
Hemoglobin: 7.8 g/dL — ABNORMAL LOW (ref 13.0–17.0)
Lymphocytes Relative: 26 %
Lymphs Abs: 4.7 10*3/uL — ABNORMAL HIGH (ref 0.7–4.0)
MCH: 26.4 pg (ref 26.0–34.0)
MCHC: 30.8 g/dL (ref 30.0–36.0)
MCV: 85.5 fL (ref 80.0–100.0)
Monocytes Absolute: 0.7 10*3/uL (ref 0.1–1.0)
Monocytes Relative: 4 %
Neutro Abs: 12.5 10*3/uL — ABNORMAL HIGH (ref 1.7–7.7)
Neutrophils Relative %: 69 %
Platelets: 400 10*3/uL (ref 150–400)
Promyelocytes Relative: 1 %
RBC: 2.96 MIL/uL — ABNORMAL LOW (ref 4.22–5.81)
RDW: 16.5 % — ABNORMAL HIGH (ref 11.5–15.5)
WBC: 18.1 10*3/uL — ABNORMAL HIGH (ref 4.0–10.5)
nRBC: 0.6 % — ABNORMAL HIGH (ref 0.0–0.2)
nRBC: 1 /100 WBC — ABNORMAL HIGH

## 2020-05-19 LAB — COMPREHENSIVE METABOLIC PANEL
ALT: 20 U/L (ref 0–44)
AST: 57 U/L — ABNORMAL HIGH (ref 15–41)
Albumin: 1.1 g/dL — ABNORMAL LOW (ref 3.5–5.0)
Alkaline Phosphatase: 55 U/L (ref 38–126)
Anion gap: 17 — ABNORMAL HIGH (ref 5–15)
BUN: 80 mg/dL — ABNORMAL HIGH (ref 8–23)
CO2: 22 mmol/L (ref 22–32)
Calcium: 7.3 mg/dL — ABNORMAL LOW (ref 8.9–10.3)
Chloride: 118 mmol/L — ABNORMAL HIGH (ref 98–111)
Creatinine, Ser: 2.76 mg/dL — ABNORMAL HIGH (ref 0.61–1.24)
GFR calc Af Amer: 25 mL/min — ABNORMAL LOW (ref >60)
GFR calc non Af Amer: 22 mL/min — ABNORMAL LOW (ref >60)
Glucose, Bld: 284 mg/dL — ABNORMAL HIGH (ref 70–99)
Potassium: 4.1 mmol/L (ref 3.5–5.1)
Sodium: 157 mmol/L — ABNORMAL HIGH (ref 135–145)
Total Bilirubin: 1.3 mg/dL — ABNORMAL HIGH (ref 0.3–1.2)
Total Protein: 5.6 g/dL — ABNORMAL LOW (ref 6.5–8.1)

## 2020-05-19 LAB — MAGNESIUM: Magnesium: 2.7 mg/dL — ABNORMAL HIGH (ref 1.7–2.4)

## 2020-05-19 LAB — PHOSPHORUS: Phosphorus: 4.3 mg/dL (ref 2.5–4.6)

## 2020-05-19 LAB — GLUCOSE, CAPILLARY
Glucose-Capillary: 239 mg/dL — ABNORMAL HIGH (ref 70–99)
Glucose-Capillary: 219 mg/dL — ABNORMAL HIGH (ref 70–99)
Glucose-Capillary: 202 mg/dL — ABNORMAL HIGH (ref 70–99)
Glucose-Capillary: 160 mg/dL — ABNORMAL HIGH (ref 70–99)
Glucose-Capillary: 144 mg/dL — ABNORMAL HIGH (ref 70–99)
Glucose-Capillary: 148 mg/dL — ABNORMAL HIGH (ref 70–99)

## 2020-05-19 MED ORDER — PIVOT 1.5 CAL PO LIQD
1000.0000 mL | ORAL | Status: DC
  Administered 2020-05-19: 1000 mL

## 2020-05-19 MED ORDER — TRAVASOL 10 % IV SOLN
INTRAVENOUS | Status: AC
  Filled 2020-05-19: qty 658.8

## 2020-05-19 MED ORDER — INSULIN ASPART 100 UNIT/ML ~~LOC~~ SOLN
0.0000 [IU] | SUBCUTANEOUS | Status: DC
  Administered 2020-05-19: 4 [IU] via SUBCUTANEOUS
  Administered 2020-05-19: 7 [IU] via SUBCUTANEOUS
  Administered 2020-05-19: 3 [IU] via SUBCUTANEOUS
  Administered 2020-05-19 – 2020-05-20 (×2): 7 [IU] via SUBCUTANEOUS
  Administered 2020-05-20: 4 [IU] via SUBCUTANEOUS
  Administered 2020-05-20: 3 [IU] via SUBCUTANEOUS
  Administered 2020-05-20: 7 [IU] via SUBCUTANEOUS
  Administered 2020-05-20 (×2): 4 [IU] via SUBCUTANEOUS
  Administered 2020-05-21 (×2): 7 [IU] via SUBCUTANEOUS
  Administered 2020-05-21: 11 [IU] via SUBCUTANEOUS
  Administered 2020-05-21 – 2020-05-22 (×4): 7 [IU] via SUBCUTANEOUS
  Administered 2020-05-22 (×2): 11 [IU] via SUBCUTANEOUS
  Administered 2020-05-22 (×2): 4 [IU] via SUBCUTANEOUS

## 2020-05-19 MED ORDER — BISACODYL 10 MG RE SUPP
10.0000 mg | Freq: Once | RECTAL | Status: AC
  Administered 2020-05-19: 10 mg via RECTAL
  Filled 2020-05-19: qty 1

## 2020-05-19 MED ORDER — FREE WATER
200.0000 mL | Freq: Four times a day (QID) | Status: DC
  Administered 2020-05-19 – 2020-05-20 (×5): 200 mL

## 2020-05-19 MED ORDER — ORAL CARE MOUTH RINSE
15.0000 mL | Freq: Four times a day (QID) | OROMUCOSAL | Status: DC
  Administered 2020-05-19 – 2020-05-22 (×12): 15 mL via OROMUCOSAL

## 2020-05-19 NOTE — Progress Notes (Signed)
2 Days Post-Op   Subjective/Chief Complaint: Extubated. Following commands and c/o abdominal and chest pain.  Had a medium, black BM last night per RN.  TMAX 101.4 Objective: Vital signs in last 24 hours: Temp:  [98 F (36.7 C)-101.4 F (38.6 C)] 98 F (36.7 C) (05/27 0332) Pulse Rate:  [50-111] 90 (05/27 0600) Resp:  [18-43] 21 (05/27 0600) BP: (93-166)/(46-67) 96/55 (05/27 0600) SpO2:  [93 %-100 %] 95 % (05/27 0600) Arterial Line BP: (106-121)/(46-51) 121/47 (05/26 1200) FiO2 (%):  [40 %] 40 % (05/26 1137) Weight:  [113.5 kg] 113.5 kg (05/27 0500) Last BM Date: 04/28/2020  Intake/Output from previous day: 05/26 0701 - 05/27 0700 In: 2185.2 [I.V.:1760.5; NG/GT:231.3; IV Piggyback:193.4] Out: 6545 [Urine:5625; Drains:750; Chest Tube:170] Intake/Output this shift: No intake/output data recorded.  Physical Exam:  Gen: alert, ill-appearing black male who appears uncomfortable, moaning in pain  HEENT: NGT in L nare, pupils are equal and round, anicteric sclerae CV: RRR, no m/r/g, HR 91 bpm during my exam Pulm: 96% on 4L [place], CTAB  Abd: soft, distended, appropriately tender, +BS  G-tube in RUQ to gravity (700cc/24h) bilious  J-tube clamped   LUQ JP 25cc/24h of SS and purulent drainage  MSK: edematous upper and lower extremities Neuro: following commands, no focal deficit   Lab Results:  Recent Labs    05/18/20 0540 05/19/20 0500  WBC 18.1* 18.1*  HGB 8.2* 7.8*  HCT 25.8* 25.3*  PLT 402* 400   BMET Recent Labs    05/18/20 0540 05/19/20 0500  NA 151* 157*  K 4.4 4.1  CL 117* 118*  CO2 20* 22  GLUCOSE 274* 284*  BUN 78* 80*  CREATININE 3.04* 2.76*  CALCIUM 7.0* 7.3*   PT/INR No results for input(s): LABPROT, INR in the last 72 hours. ABG Recent Labs    04/29/2020 1302  PHART 7.405  HCO3 25.6    Studies/Results: DG CHEST PORT 1 VIEW  Result Date: 05/19/2020 CLINICAL DATA:  Pneumothorax follow-up EXAM: PORTABLE CHEST 1 VIEW COMPARISON:  Yesterday  FINDINGS: The enteric tube tip is at the lower esophagus with side port over the mid chest. This positioning is intentional based on abdominal CT from 4 days ago the trachea is been extubated. Left IJ line with tip near the left brachiocephalic vein. Left chest tubes in place. Hazy bilateral chest opacity, usually atelectasis and pleural fluid. No visible pneumothorax. IMPRESSION: Atelectasis and pleural effusions by recent CT. There is increased volume loss/opacity after extubation. Electronically Signed   By: Marnee Spring M.D.   On: 05/19/2020 07:24   DG CHEST PORT 1 VIEW  Result Date: 05/18/2020 CLINICAL DATA:  74 year old male on mechanical ventilation. EXAM: PORTABLE CHEST 1 VIEW COMPARISON:  Chest x-ray 05/16/2020. FINDINGS: An endotracheal tube is in place with tip 1.0 cm above the carina. Nasogastric tube the distal esophagus in position with tip projecting over at least 4 cm above the gastroesophageal junction. There is a left-sided internal jugular central venous catheter with tip terminating in the proximal superior vena cava. Three left-sided chest tubes remain in position with tips projecting over the left upper and lower hemithorax. Skin staples projecting over the lower left hemithorax. Lung volumes are low. Bibasilar opacities (left greater than right) which likely reflect areas of atelectasis. Probable small left pleural effusion. No appreciable pneumothorax. No evidence of pulmonary edema. Heart size is normal. Upper mediastinal contours are within normal limits. IMPRESSION: 1. Support apparatus, as above. Advancement of the nasogastric tube at least 15 cm for  more optimal placement is again recommended. 2. Low lung volumes with persistent bibasilar areas of probable subsegmental atelectasis and probable small left pleural effusion. Electronically Signed   By: Vinnie Langton M.D.   On: 05/18/2020 10:00   DG Abd Portable 1V  Result Date: 05/18/2020 CLINICAL DATA:  Jejunostomy tube.  EXAM: PORTABLE ABDOMEN - 1 VIEW COMPARISON:  05/22/2020 FINDINGS: Normal bowel gas pattern. Jejunostomy tube containing contrast as well as contrast in multiple small bowel loops. The tube tip is within a loop of jejunum in the mid to lower abdomen in the midline. There is some gas and stool in normal caliber colon. Lumbar and lower thoracic spine degenerative changes. IMPRESSION: No acute abnormality. Jejunostomy tube tip in a loop of jejunum in the midline. Electronically Signed   By: Claudie Revering M.D.   On: 05/18/2020 16:20    Anti-infectives: Anti-infectives (From admission, onward)   Start     Dose/Rate Route Frequency Ordered Stop   05/18/20 1400  piperacillin-tazobactam (ZOSYN) IVPB 3.375 g     3.375 g 12.5 mL/hr over 240 Minutes Intravenous Every 8 hours 05/18/20 0748 05/22/20 2359   05/10/2020 2200  piperacillin-tazobactam (ZOSYN) IVPB 3.375 g  Status:  Discontinued     3.375 g 12.5 mL/hr over 240 Minutes Intravenous Every 12 hours 05/14/2020 1155 05/18/20 0748   05/16/20 2300  fluconazole (DIFLUCAN) IVPB 200 mg     200 mg 100 mL/hr over 60 Minutes Intravenous Every 24 hours 05/16/20 1247     05/16/20 2000  fluconazole (DIFLUCAN) IVPB 400 mg  Status:  Discontinued     400 mg 100 mL/hr over 120 Minutes Intravenous Every 24 hours 05/21/2020 1936 05/16/20 1247   05/01/2020 2000  fluconazole (DIFLUCAN) IVPB 800 mg     800 mg 200 mL/hr over 120 Minutes Intravenous  Once 04/24/2020 1936 04/28/2020 2339   05/21/2020 1945  fluconazole (DIFLUCAN) IVPB 100 mg  Status:  Discontinued    Note to Pharmacy: Pharmacy may change dose as needed  For intraabdominal infection/ florid leak from esophagogastric junction   100 mg 50 mL/hr over 60 Minutes Intravenous Every 24 hours 05/07/2020 1931 05/13/2020 1936   05/10/2020 0600  piperacillin-tazobactam (ZOSYN) IVPB 3.375 g  Status:  Discontinued     3.375 g 12.5 mL/hr over 240 Minutes Intravenous Every 8 hours 05/16/2020 0544 04/29/2020 1155   05/10/20 0600  ceFAZolin  (ANCEF) IVPB 2g/100 mL premix  Status:  Discontinued     2 g 200 mL/hr over 30 Minutes Intravenous Every 8 hours 05/09/20 1542 05/16/2020 0544   05/07/20 1000  cefTRIAXone (ROCEPHIN) 2 g in sodium chloride 0.9 % 100 mL IVPB  Status:  Discontinued     2 g 200 mL/hr over 30 Minutes Intravenous Every 24 hours 04/24/2020 1036 05/09/20 1542   05/03/2020 0745  cefTRIAXone (ROCEPHIN) 2 g in sodium chloride 0.9 % 100 mL IVPB     2 g 200 mL/hr over 30 Minutes Intravenous  Once 05/08/2020 0730 05/16/2020 1761      Assessment/Plan: DM2  Adrenal incidentaloma Urinary retention enlarged prostate Klebsiella bacteremia Hypernatremia - 157 today despite increase in free water AKI - BUN 80/SCr 2.76   Acute hypoxic respiratory failure- resolved, extubated 5/26 Gastric necrosis, gastric volvulus  S/p partial gastrectomy, gastrojejunostomy tube placement - Dr. Bobbye Morton 5/21 S/p exp lap/ left thoracotomy/resection of GE junction and distal esophagus for ongoing necrosis - Dr. Juluis Pitch 05/11/2020 S/p revision of gastrostomy tube/placement of feeding jejunostomy tube/closure of abdominal wall/ placmenet of wound  VAC - Dr. Corliss Skains 04/25/2020  - WBC 18 (stable), TMAX 101.4 yesterday evening - continue nasoesophageal tube to LIWS - NO MEDS per G tube - straight drain only - some pain with initiation of J-tube feeds 5/26 >> GG study shows J-tube is intra-luminal with no leakage of contrast. Ok to trickle TF today. - chest tube per TCTS, timing of spit fistula per TCTS - Will eventually need referral to tertiary medical center for reconstruction possibly with colonic interposition graft - dark/black stool not unexpected given recent surgery on stomach/proximal small bowel, monitor. Dulcolax today.   FEN: NPO, IVF, trickle J-tube feeds ID: Zosyn VTE: SCD's, SQH Foley: in place 2/2 prostatism with urosepsis    LOS: 13 days    Adam Phenix 05/19/2020

## 2020-05-19 NOTE — Progress Notes (Signed)
NAME:  Ethan Johnson, MRN:  [id number], DOB:  [birth year], LOS: 38 ADMISSION DATE:  06-03-2020, CONSULTATION DATE:  05/08/2020 REFERRING MD:  Grandville Silos - Trauma, CHIEF COMPLAINT:  Gastric infarction.   HPI/course in hospital  74 year old man who originally presented 6 days ago with abdominal pain and was found to have urinary retention in the context of worsening symptoms of prostatism and an elevated PSA. Symptoms improved following insertion of Foley and drainage of 1.5L of urine.  SVT at that time which was felt to be sinus tachycardia as no conversion with adenosine. Hypotension and tachycardia improved initially with relief of obstruction.   Found to have Klebsiella bacteremia which was treated with ceftriaxone. Transitioned to orals and was to be discharged with Foley in place for outpatient urology evaluation.  Noted to have gastric distension either due to outlet obstruction or gastroparesis, exacerbated by acute illness.   5/19 developed sudden abdominal pain with tachypnea and desaturation. Loose dark stools.   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   5/20 OR  -  Subtotal gastrectomy with placement of jejunostomy tube. Normotensive throughout the case, minimal blood loss, fluids given.  Returned to ICU intubated.  5/23 OR - Purulent ascites was drained from the abdomen.:  Small bowel all up.  Viable.  Gastrojejunostomy was in good position.  Source was felt to be dehiscence of the proximal staple line at the level of the esophagus.  Patient then underwent thoracotomy and more proximal esophagectomy.  Plan to take patient back tomorrow for further washout.   5/24 OR for ex lap, revision of GJ tube to G tube, placement of feeding jejunostomy, closure of abdominal wall, placement of wound vac.  Past Medical History   Past Medical History:  Diagnosis Date  . 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 Aortic atherosclerosis ([place])   . BPH (benign prostatic hyperplasia)    . Diabetes mellitus without complication ([place])   . Hepatic steatosis   . Neuropathy      Past Surgical History:  Procedure Laterality Date  . APPLICATION OF WOUND VAC N/A 05/07/2020   Procedure: Application Of Abthera Wound Vac;  Surgeon: Clovis Riley, MD;  Location: [place];  Service: General;  Laterality: N/A;  . ESOPHAGOGASTRODUODENOSCOPY (EGD) WITH PROPOFOL N/A 05/22/2020   Procedure: ESOPHAGOGASTRODUODENOSCOPY (EGD) WITH PROPOFOL;  Surgeon: Milus Banister, MD;  Location: [facility] ENDOSCOPY;  Service: Endoscopy;  Laterality: N/A;  . GASTROSTOMY N/A 04/30/2020   Procedure: REMOVE OF JEJUNOSTOMY TUBE, INSERTATION OF JEJUNOSTOMY AND GASTROSTOMY TUBE  AND ABDOMINAL VAC CHANGE;  Surgeon: Donnie Mesa, MD;  Location: [place];  Service: General;  Laterality: N/A;  . LAPAROTOMY N/A 04/28/2020   Procedure: SUBTOTAL GASTRECTOMY, JEJUNOSTOMY TUBE;  Surgeon: Jesusita Oka, MD;  Location: [place];  Service: General;  Laterality: N/A;  . LAPAROTOMY N/A 05/07/2020   Procedure: EXPLORATORY LAPAROTOMY;  Surgeon: Clovis Riley, MD;  Location: [place];  Service: General;  Laterality: N/A;  . LAPAROTOMY N/A 04/24/2020   Procedure: EXPLORATORY LAPAROTOMY;  Surgeon: Donnie Mesa, MD;  Location: [place];  Service: General;  Laterality: N/A;  . VIDEO ASSISTED THORACOSCOPY (VATS)/THOROCOTOMY Left 05/01/2020   Procedure: Video Assisted Thoracoscopy (Vats)/Thorocotomy;  Surgeon: Wonda Olds, MD;  Location: [place];  Service: Thoracic;  Laterality: Left;     Interim history/subjective:  Extubated yesterday.  Has remained delirious requiring initiation of Precedex.  Abdominal pain following initiation of trickle feeds.  Trickle feeds have been stopped but  pain has persisted.  Bowel movement today following Reglan.  Objective   Blood pressure (!) 110/57, pulse 92, temperature 99.6 F (37.6 C), temperature source Axillary, resp. rate (!) 36, height 5\' 10"  (1.778 m), weight 113.5 kg, SpO2 96 %. CVP:  [10 mmHg-13  mmHg] 10 mmHg  Vent Mode: PSV;CPAP FiO2 (%):  [40 %] 40 % PEEP:  [5 cmH20] 5 cmH20 Pressure Support:  [5 cmH20] 5 cmH20   Intake/Output Summary (Last 24 hours) at 05/19/2020 0951 Last data filed at 05/19/2020 0851 Gross per 24 hour  Intake 2067.91 ml  Output 6145 ml  Net -4077.09 ml   Filed Weights   05/11/2020 0500 05/18/20 0500 05/19/20 0500  Weight: 112.5 kg 114 kg 113.5 kg    Examination: General: Adult male, resting in bed, in NAD. Neuro: Sedated but opens eyes and follows basic commands. HEENT: [place]/AT. Sclerae anicteric. ETT in place. Cardiovascular: RRR, no M/R/G.  Lungs: Left chest tube with no leak.  Respirations even and unlabored.  CTA bilaterally, No W/R/R. Abdomen: LUQ G tube drain in place.  RUQ jejunostomy.  BS x 4, soft, NT/ND.  Musculoskeletal: No gross deformities, no edema.  Skin: Intact, warm, no rashes.  Assessment & Plan:   Was critically illcritically ill due to expected acute hypoxic respiratory failure following partial gastrectomy.  Remains at high risk for intubation given mental status. - Analgesia for upper abdominal incision.  - At increased risk of post-operative pulmonary complications given body habitus and location of incision.   Agitated delirium. -Judicious pain medication -Continue Precedex -If tolerating enteral nutrition can start oral sedation as well.  AKI from critical illness - resolving.  Mild hypernatremia. - Start free water per tube. - Follow BMP.  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 S/p left chest tube placement following left thoracotomy intraop (Dr. 05/21/20). - Post op care per surgery. - Chest tube management per TCTS. - per surgery, RUQ G tube is only for drainage (has very small portion of stomach left).  LUQ J tube can be used for feeding/meds/etc. - Leave naso-esophageal tube in place post extubation, this will help with preventing mucus buildup in  remaining portion of esophagus.  He will eventually need "spit fistula" before definitive surgical plan down the road (likely at [facility] or [facility]).   Urinary retention due to BPH - Leave Foley in place.  DM. - SSI and levemir.  Daily Goals Checklist  Pain/Anxiety/Delirium protocol (if indicated): Fentanyl and precedex infusions.  VAP protocol (if indicated): Bundle in place DVT prophylaxis: Heparin TID Nutritional status and feeding goals: TPN GI prophylaxis: PPI Glucose control: SSI and levemir Mobility/therapy needs: bedrest for now Code Status: full  Family Communication: Will call family. Disposition: ICU  [facility], MD [facility] ICU Physician [facility]  Pager: [phone] Mobile: [phone] After hours: [phone].  05/19/2020, 9:53 AM      05/19/2020, 9:51 AM

## 2020-05-19 NOTE — Progress Notes (Signed)
PHARMACY - TOTAL PARENTERAL NUTRITION CONSULT NOTE   Indication: bowel necrosis  Patient Measurements: Height: 5\' 10"  (177.8 cm) Weight: 113.5 kg (250 lb 3.6 oz) IBW/kg (Calculated) : 73 TPN AdjBW (KG): 84.2 Body mass index is 35.9 kg/m.  Assessment: 29 yom originally presenting 5/14 with abdominal pain, urinary retention due to worsening symptoms of prostatism and elevated PSA. Symptoms improved s/p Foley. Klebsiella bacteremia treated with ceftriaxone. 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 Now s/p OR 5/20 for subtotal gastrectomy with placement of jejunostomy tube. Patient then underwent thoracotomy and more proximal esophagectomy. Plan back to OR 5/25. May also need further TCTS procedure. Holding TF. Pharmacy consulted to start TPN.  5/26 PM - patient extubated, reporting increasing abdominal pain with irrigation/trickle feeds through J-tube - no signs of leak. Planning to resume trickle feeds 5/27 + add Reglan.  Glucose / Insulin: hx DM2, A1c 7.6. CBGs 219-284 on Levemir 25u BID (received 1 dose) + sSSI q4h (utilized 34 units in last 24 hrs) Electrolytes: Na up to 157 (started on FW 235ml q8h but unable to tolerate yesterday), Cl 118, corrected Ca WNL but iCa 0.76 (redraw pending, s/p Ca gluconate 4g IV x1 on 5/25), Phos down to 4.3, Mag high 2.7, others WNL Renal: AKI - SCr trend back down to 2.76 (1.06 on 5/18), BUN leveling off at 80; +0.5L this admit. S/p Lasix 60mg  IV x 1 yesterday LFTs / TGs: AST down to 57, ALT wnl (noted on fluconazole), Tbili 1.3, TG 184 (off propofol 5/24) Prealbumin / albumin: Prealbumin <5, albumin 1.1 Intake / Output; MIVF: G-tube output 455 ml/24 hrs; LBM 5/27; UOP 2.1 ml/kg/hr GI Imaging: 5/19 CT abd - stomach distended by air, fluid, ingested  contrast with unusual dependent air loculations of gastric fundus, high-grade gastric outlet obstruction, new bilateral pleural effusions 5/23 CT abd - gas and free fluid in LU abd, scattered free air and moderate pneumoperitoneum, small volume ascites; post-surgical changes at upper range of normal for recent surgery Surgeries / Procedures: 5/21 partial gastrectomy, gastrojejunostomy tube placement 5/23 ex-lap, L thoracotomy/resection of GE junction and distal esophagus for ongoing necrosis, ab-thera vac placement 5/25 abd washout, attempt abdomen closure, revision of jejunostomy tube for nutrition and g-tube for gastric drainage  Central access: double lumen CVC, planning PICC 5/25 TPN start date: 04/26/2020 (TPN not hung 5/24 due to CL tip not in recommended area and PICC not yet placed)  Nutritional Goals (per RD recommendation on 5/21): kCal: 2500-2700, Protein: 150-170g, Fluid: >2L Goal TPN rate is 110 mL/hr (provides 161 g of protein and 2461 kcals per day)  Current Nutrition:  TPN  Pivot 1.5 at 10 ml/hr (held 5/26, planning to resume 5/27)  Plan:  Continue TPN at 45 mL/hr at 1800 - will not advance rate today with CBGs remaining elevated >200. Titrate to goal as appropriate. -TPN will provide 66g protein, 130g CHO, 30g SMOF lipids, for 1006 total kCal, meeting 44% of protein and 40% of patient kCal needs. Electrolytes in TPN: remove Na, reduce Mag to 41meq/L, continue reduced K 21mEq/L and phos 5 mmol/L (may consider increasing soon as AKI resolves), standard 16mEq/L of Ca; max acetate Add standard MVI and trace elements to TPN. Remove chromium for now with AKI Give Levemir 25 units [place] x 1 this AM, then d/c per discussion with CCM  and add 40 units regular insulin to TPN bag + change to resistant q4h SSI and adjust as needed  Monitor TPN labs on Mon/Thurs F/u toleration of tube feeds and ability to wean TPN   Leia Alf, PharmD, BCPS Please check AMION for all [facility] contact  numbers Clinical Pharmacist 05/19/2020 8:02 AM

## 2020-05-19 NOTE — Progress Notes (Signed)
Patient ID: Ethan Johnson, male   DOB: [birth year], 74 y.o.   MRN: [id number] TCTS DAILY ICU PROGRESS NOTE                   [address]            [place] [address]          [phone]   2 Days Post-Op Procedure(s) (LRB): EXPLORATORY LAPAROTOMY (N/A) REMOVE OF JEJUNOSTOMY TUBE, INSERTATION OF JEJUNOSTOMY AND GASTROSTOMY TUBE  AND ABDOMINAL VAC CHANGE (N/A)  Total Length of Stay:  LOS: 13 days   Subjective: Extubated but minimally responsive   Objective: Vital signs in last 24 hours: Temp:  [98 F (36.7 C)-101.4 F (38.6 C)] 99.7 F (37.6 C) (05/27 1600) Pulse Rate:  [50-104] 99 (05/27 1700) Cardiac Rhythm: Normal sinus rhythm (05/27 1600) Resp:  [21-37] 24 (05/27 1700) BP: (96-132)/(47-75) 113/52 (05/27 1700) SpO2:  [88 %-99 %] 95 % (05/27 1700) Weight:  [113.5 kg] 113.5 kg (05/27 0500)  Filed Weights   04/28/2020 0500 05/18/20 0500 05/19/20 0500  Weight: 112.5 kg 114 kg 113.5 kg    Weight change: -0.5 kg   Hemodynamic parameters for last 24 hours:    Intake/Output from previous day: 05/26 0701 - 05/27 0700 In: 2185.2 [I.V.:1760.5; NG/GT:231.3; IV Piggyback:193.4] Out: 4259 [Urine:5625; Drains:750; Chest Tube:170]  Intake/Output this shift: Total I/O In: 1108.7 [I.V.:586.1; NG/GT:480.3; IV Piggyback:42.3] Out: 2065 [Urine:1650; Drains:395; Chest Tube:20]  Current Meds: Scheduled Meds: . sodium chloride   Intravenous Once  . chlorhexidine gluconate (MEDLINE KIT)  15 mL Mouth Rinse BID  . Chlorhexidine Gluconate Cloth  6 each Topical Q0600  . feeding supplement (PIVOT 1.5 CAL)  1,000 mL Per Tube Q24H  . free water  200 mL Per Tube Q6H  . heparin injection (subcutaneous)  5,000 Units Subcutaneous Q8H  . insulin aspart  0-20 Units Subcutaneous Q4H  . mouth rinse  15 mL Mouth Rinse QID  . metoCLOPramide (REGLAN) injection  10 mg Intravenous Q6H  . pantoprazole (PROTONIX) IV  40 mg Intravenous Q12H  . sodium chloride flush  10-40 mL  Intracatheter Q12H   Continuous Infusions: . sodium chloride 10 mL/hr at 05/11/2020 1221  . sodium chloride Stopped (05/16/20 0950)  . dexmedetomidine (PRECEDEX) IV infusion 0.7 mcg/kg/hr (05/19/20 1400)  . fluconazole (DIFLUCAN) IV 200 mg (05/18/20 2339)  . lactated ringers Stopped (05/19/20 1343)  . piperacillin-tazobactam (ZOSYN)  IV 3.375 g (05/19/20 1400)  . TPN ADULT (ION) 45 mL/hr at 05/19/20 1749   PRN Meds:.sodium chloride, HYDROmorphone (DILAUDID) injection, labetalol, lip balm, sodium chloride flush    Lab Results: CBC: Recent Labs    05/18/20 0540 05/19/20 0500  WBC 18.1* 18.1*  HGB 8.2* 7.8*  HCT 25.8* 25.3*  PLT 402* 400   BMET:  Recent Labs    05/18/20 0540 05/19/20 0500  NA 151* 157*  K 4.4 4.1  CL 117* 118*  CO2 20* 22  GLUCOSE 274* 284*  BUN 78* 80*  CREATININE 3.04* 2.76*  CALCIUM 7.0* 7.3*    CMET: Lab Results  Component Value Date   WBC 18.1 (H) 05/19/2020   HGB 7.8 (L) 05/19/2020   HCT 25.3 (L) 05/19/2020   PLT 400 05/19/2020   GLUCOSE 284 (H) 05/19/2020   CHOL 65 05/07/2020   TRIG 184 (H) 05/18/2020   HDL <10 (L) 05/07/2020   LDLCALC NOT CALCULATED 05/07/2020   ALT 20 05/19/2020   AST 57 (H) 05/19/2020   NA 157 (  H) 05/19/2020   K 4.1 05/19/2020   CL 118 (H) 05/19/2020   CREATININE 2.76 (H) 05/19/2020   BUN 80 (H) 05/19/2020   CO2 22 05/19/2020   TSH 2.236 05/01/2020   INR 1.02 07/30/2014   HGBA1C 7.6 (H) 05/10/2020      PT/INR: No results for input(s): LABPROT, INR in the last 72 hours. Radiology: DG CHEST PORT 1 VIEW  Result Date: 05/19/2020 CLINICAL DATA:  Pneumothorax follow-up EXAM: PORTABLE CHEST 1 VIEW COMPARISON:  Yesterday FINDINGS: The enteric tube tip is at the lower esophagus with side port over the mid chest. This positioning is intentional based on abdominal CT from 4 days ago the trachea is been extubated. Left IJ line with tip near the left brachiocephalic vein. Left chest tubes in place. Hazy bilateral chest  opacity, usually atelectasis and pleural fluid. No visible pneumothorax. IMPRESSION: Atelectasis and pleural effusions by recent CT. There is increased volume loss/opacity after extubation. Electronically Signed   By: Monte Fantasia M.D.   On: 05/19/2020 07:24     Assessment/Plan: S/P Procedure(s) (LRB): EXPLORATORY LAPAROTOMY (N/A) REMOVE OF JEJUNOSTOMY TUBE, INSERTATION OF JEJUNOSTOMY AND GASTROSTOMY TUBE  AND ABDOMINAL VAC CHANGE (N/A) Extubated yesterday  Leave ng tube in place  Leave chest tubes     Grace Isaac 05/19/2020 6:11 PM

## 2020-05-20 LAB — CALCIUM, IONIZED: Calcium, Ionized, Serum: 4.5 mg/dL (ref 4.5–5.6)

## 2020-05-20 LAB — COMPREHENSIVE METABOLIC PANEL
ALT: 17 U/L (ref 0–44)
AST: 47 U/L — ABNORMAL HIGH (ref 15–41)
Albumin: 1.1 g/dL — ABNORMAL LOW (ref 3.5–5.0)
Alkaline Phosphatase: 52 U/L (ref 38–126)
Anion gap: 7 (ref 5–15)
BUN: 69 mg/dL — ABNORMAL HIGH (ref 8–23)
CO2: 26 mmol/L (ref 22–32)
Calcium: 7.6 mg/dL — ABNORMAL LOW (ref 8.9–10.3)
Chloride: 128 mmol/L — ABNORMAL HIGH (ref 98–111)
Creatinine, Ser: 2.48 mg/dL — ABNORMAL HIGH (ref 0.61–1.24)
GFR calc Af Amer: 29 mL/min — ABNORMAL LOW (ref >60)
GFR calc non Af Amer: 25 mL/min — ABNORMAL LOW (ref >60)
Glucose, Bld: 192 mg/dL — ABNORMAL HIGH (ref 70–99)
Potassium: 3.7 mmol/L (ref 3.5–5.1)
Sodium: 161 mmol/L (ref 135–145)
Total Bilirubin: 1.5 mg/dL — ABNORMAL HIGH (ref 0.3–1.2)
Total Protein: 5.9 g/dL — ABNORMAL LOW (ref 6.5–8.1)

## 2020-05-20 LAB — CULTURE, BLOOD (ROUTINE X 2)
Culture: NO GROWTH
Culture: NO GROWTH

## 2020-05-20 LAB — CBC WITH DIFFERENTIAL/PLATELET
Abs Immature Granulocytes: 0.75 10*3/uL — ABNORMAL HIGH (ref 0.00–0.07)
Basophils Absolute: 0.1 10*3/uL (ref 0.0–0.1)
Basophils Relative: 0 %
Eosinophils Absolute: 0.1 10*3/uL (ref 0.0–0.5)
Eosinophils Relative: 1 %
HCT: 25.9 % — ABNORMAL LOW (ref 39.0–52.0)
Hemoglobin: 7.8 g/dL — ABNORMAL LOW (ref 13.0–17.0)
Immature Granulocytes: 4 %
Lymphocytes Relative: 38 %
Lymphs Abs: 6.6 10*3/uL — ABNORMAL HIGH (ref 0.7–4.0)
MCH: 26.2 pg (ref 26.0–34.0)
MCHC: 30.1 g/dL (ref 30.0–36.0)
MCV: 86.9 fL (ref 80.0–100.0)
Monocytes Absolute: 1 10*3/uL (ref 0.1–1.0)
Monocytes Relative: 6 %
Neutro Abs: 8.9 10*3/uL — ABNORMAL HIGH (ref 1.7–7.7)
Neutrophils Relative %: 51 %
Platelets: 435 10*3/uL — ABNORMAL HIGH (ref 150–400)
RBC: 2.98 MIL/uL — ABNORMAL LOW (ref 4.22–5.81)
RDW: 16.8 % — ABNORMAL HIGH (ref 11.5–15.5)
WBC: 17.5 10*3/uL — ABNORMAL HIGH (ref 4.0–10.5)
nRBC: 1 % — ABNORMAL HIGH (ref 0.0–0.2)

## 2020-05-20 LAB — GLUCOSE, CAPILLARY
Glucose-Capillary: 160 mg/dL — ABNORMAL HIGH (ref 70–99)
Glucose-Capillary: 151 mg/dL — ABNORMAL HIGH (ref 70–99)
Glucose-Capillary: 202 mg/dL — ABNORMAL HIGH (ref 70–99)
Glucose-Capillary: 202 mg/dL — ABNORMAL HIGH (ref 70–99)
Glucose-Capillary: 194 mg/dL — ABNORMAL HIGH (ref 70–99)
Glucose-Capillary: 218 mg/dL — ABNORMAL HIGH (ref 70–99)

## 2020-05-20 LAB — MAGNESIUM: Magnesium: 2.6 mg/dL — ABNORMAL HIGH (ref 1.7–2.4)

## 2020-05-20 LAB — PHOSPHORUS: Phosphorus: 3.1 mg/dL (ref 2.5–4.6)

## 2020-05-20 MED ORDER — DEXTROSE 5 % IV SOLN: INTRAVENOUS | Status: AC

## 2020-05-20 MED ORDER — LIDOCAINE 5 % EX PTCH
1.0000 | MEDICATED_PATCH | CUTANEOUS | Status: DC
  Administered 2020-05-20 – 2020-05-22 (×3): 1 via TRANSDERMAL
  Filled 2020-05-20 (×4): qty 1

## 2020-05-20 MED ORDER — FUROSEMIDE 10 MG/ML IJ SOLN
40.0000 mg | Freq: Once | INTRAMUSCULAR | Status: AC
  Administered 2020-05-20: 40 mg via INTRAVENOUS
  Filled 2020-05-20: qty 4

## 2020-05-20 MED ORDER — PIVOT 1.5 CAL PO LIQD
1000.0000 mL | ORAL | Status: DC
  Administered 2020-05-20 – 2020-05-21 (×2): 1000 mL

## 2020-05-20 MED ORDER — HYDROMORPHONE HCL 1 MG/ML IJ SOLN
2.0000 mg | INTRAMUSCULAR | Status: DC | PRN
  Administered 2020-05-20 – 2020-05-21 (×8): 2 mg via INTRAVENOUS
  Filled 2020-05-20 (×10): qty 2

## 2020-05-20 MED ORDER — FREE WATER
300.0000 mL | Status: DC
  Administered 2020-05-20 – 2020-05-22 (×14): 300 mL

## 2020-05-20 MED ORDER — QUETIAPINE FUMARATE 25 MG PO TABS
25.0000 mg | ORAL_TABLET | Freq: Two times a day (BID) | ORAL | Status: DC
  Administered 2020-05-20 – 2020-05-21 (×4): 25 mg
  Filled 2020-05-20 (×4): qty 1

## 2020-05-20 MED ORDER — INSULIN DETEMIR 100 UNIT/ML ~~LOC~~ SOLN
20.0000 [IU] | Freq: Two times a day (BID) | SUBCUTANEOUS | Status: AC
  Administered 2020-05-20 – 2020-05-21 (×2): 20 [IU] via SUBCUTANEOUS
  Filled 2020-05-20 (×3): qty 0.2

## 2020-05-20 MED ORDER — TRAVASOL 10 % IV SOLN
INTRAVENOUS | Status: DC
  Filled 2020-05-20: qty 1317.6

## 2020-05-20 NOTE — Consult Note (Signed)
WOC Nurse Consult Note: Reason for Consult:First post op dressing change. MD would like to discontinue NPWT (VAC) dressing and transition to NS Moist gauze dressing twice daily.  Wound type:surgical Pressure Injury POA: NA Measurement:19cm x 1 cm x 0.4 cm  Wound bed:25% adipose yellow tissue and 75% pale pink nongranulating Drainage (amount, consistency, odor) minimal serosanguinous  No odor.  Periwound:Drains in place.   0.3 cm medical adhesive related skin injury to periwound at 3:00.  Abdomen is firm and distended.  Area protected with NS moist gauze.  Dressing procedure/placement/frequency:Cleanse abdominal wound with NS and pat dry.  Apply NS moist kerlix and secure with ABD pad and tape.  Change twice daily.  Will not follow at this time.  Please re-consult if needed.  Maple Hudson MSN, RN, FNP-BC CWON Wound, Ostomy, Continence Nurse Pager [phone]

## 2020-05-20 NOTE — Progress Notes (Signed)
Patient RN to remove TNA at 1800 (5/28) per today's order and flush central line per policy.

## 2020-05-20 NOTE — Progress Notes (Signed)
Nutrition Follow-up  DOCUMENTATION CODES:   Obesity unspecified  INTERVENTION:   Pivot 1.5 at 20 ml/h (480 ml per day) via J-tube   Recommend advance TF as able to goal rate: Pivot 1.5 @ 75 ml/hr   Provides: 2700 kcal, 168 grams protein, and 1366 ml free water.   If unable to advance TF to goal rate consider 1/2 dose TPN without sodium and D5  NUTRITION DIAGNOSIS:   Increased nutrient needs related to post-op healing as evidenced by estimated needs. Ongoing.   GOAL:   Patient will meet greater than or equal to 90% of their needs Progressing.   MONITOR:   TF tolerance  REASON FOR ASSESSMENT:   Consult Enteral/tube feeding initiation and management  ASSESSMENT:   Pt with PMH of DM, aortic atherosclerosis, BPH, hepatic steatosis, neuropathy, and adrenal incidentaloma admitted with abd pain x 1 week PTA, bladder outlet obstruction, sepsis, uremia/AKI from massively dilated prostate.   Pt discussed during ICU rounds and with RN.  TCTS planning for spit fistula; pt will need a colon interposition graft eventually.  Spoke with CCM, plan to d/c TPN and initiate D5 due to hypernatremia. Plan to continue to advance TF. Per CCM pain not associated with increase in TF.  Spoke with wife about nutrition plan.    5/20 s/p partial gastrectomy, gastrojejunostomy tube placement with nasoesophageal tube, g-tube to LIWS, and J-tube.   5/23 ex-lap, L thoracotomy/resection of GE junction and distal esophagus for ongoing necrosis, ab-thera vac placement -- abdomen left open with further plans for surgery as able 5/24 TNA initiated due to prolonged bowel rest 5/25 s/p revision of gastrostomy tube/placement of feeding jejunostomy tube, closure of abd wall, placement of wound VAC 5/26 extubated: trickle TF started; TF stopped later in day due to increased pain 5/27 TF @ 10 5/28 TF @ 20; TPN d/c'ed   Medications reviewed and include: SSI reglan 10 mg every 6 hours - d/c after today D5  @ 45 ml/hr   Labs reviewed: Na 161 (H), BUN: 69 (H), Cr: 2.48 (H) CBG's: 202-151-160  UOP: 3360 ml  JP: 185 ml G-tube: 570 ml VAC: 45 ml - removed Chest tube: 30 ml  J-tube   Free water: 300 ml every 4 hours = 1800 ml   Diet Order:   Diet Order            Diet NPO time specified  Diet effective now              EDUCATION NEEDS:   No education needs have been identified at this time  Skin:  Skin Assessment: Skin Integrity Issues: Skin Integrity Issues:: Incisions  Last BM:  5/20  Height:   Ht Readings from Last 1 Encounters:  05/21/2020 5\' 10"  (1.778 m)    Weight:   Wt Readings from Last 1 Encounters:  05/20/20 105.7 kg    Ideal Body Weight:  75.4 kg  BMI:  Body mass index is 33.44 kg/m.  Estimated Nutritional Needs:   Kcal:  2500-2700  Protein:  150-170 grams  Fluid:  >2 L/day  05/22/20., RD, LDN, CNSC See AMiON for contact information '

## 2020-05-20 NOTE — Progress Notes (Signed)
Patient ID: Ethan Johnson, male   DOB: Jul 14, 1946, 74 y.o.   MRN: [id number] TCTS DAILY ICU PROGRESS NOTE                   [address]            [place] [address]          [phone]   3 Days Post-Op Procedure(s) (LRB): EXPLORATORY LAPAROTOMY (N/A) REMOVE OF JEJUNOSTOMY TUBE, INSERTATION OF JEJUNOSTOMY AND GASTROSTOMY TUBE  AND ABDOMINAL VAC CHANGE (N/A)  Total Length of Stay:  LOS: 14 days   Subjective: Opens eyes , groaning, depressed mental status    Objective: Vital signs in last 24 hours: Temp:  [97.9 F (36.6 C)-101.8 F (38.8 C)] 97.9 F (36.6 C) (05/28 0800) Pulse Rate:  [89-115] 113 (05/28 0900) Cardiac Rhythm: Normal sinus rhythm;Sinus tachycardia (05/28 0800) Resp:  [22-39] 29 (05/28 0900) BP: (95-145)/(45-75) 140/60 (05/28 0900) SpO2:  [86 %-98 %] 96 % (05/28 0900) Weight:  [105.7 kg] 105.7 kg (05/28 0500)  Filed Weights   05/18/20 0500 05/19/20 0500 05/20/20 0500  Weight: 114 kg 113.5 kg 105.7 kg    Weight change: -7.8 kg   Hemodynamic parameters for last 24 hours:    Intake/Output from previous day: 05/27 0701 - 05/28 0700 In: 3006.4 [I.V.:1787.8; NG/GT:1020.3; IV Piggyback:198.2] Out: 4190 [Urine:3360; Drains:800; Chest Tube:30]  Intake/Output this shift: No intake/output data recorded.  Current Meds: Scheduled Meds: . sodium chloride   Intravenous Once  . chlorhexidine gluconate (MEDLINE KIT)  15 mL Mouth Rinse BID  . Chlorhexidine Gluconate Cloth  6 each Topical Q0600  . feeding supplement (PIVOT 1.5 CAL)  1,000 mL Per Tube Q24H  . free water  300 mL Per Tube Q4H  . heparin injection (subcutaneous)  5,000 Units Subcutaneous Q8H  . insulin aspart  0-20 Units Subcutaneous Q4H  . lidocaine  1 patch Transdermal Q24H  . mouth rinse  15 mL Mouth Rinse QID  . metoCLOPramide (REGLAN) injection  10 mg Intravenous Q6H  . pantoprazole (PROTONIX) IV  40 mg Intravenous Q12H  . QUEtiapine  25 mg Per Tube BID  . sodium chloride  flush  10-40 mL Intracatheter Q12H   Continuous Infusions: . sodium chloride 10 mL/hr at 05/16/2020 1221  . sodium chloride Stopped (05/16/20 0950)  . dexmedetomidine (PRECEDEX) IV infusion 0.6 mcg/kg/hr (05/20/20 0834)  . dextrose    . lactated ringers 10 mL/hr at 05/20/20 0700  . TPN ADULT (ION) 45 mL/hr at 05/20/20 0700   PRN Meds:.sodium chloride, HYDROmorphone (DILAUDID) injection, labetalol, lip balm, sodium chloride flush  General appearance: distracted, slowed mentation and uncooperative Neurologic: moves extemities but with weak effort  Heart: regular rate and rhythm, S1, S2 normal, no murmur, click, rub or gallop Lungs: diminished breath sounds bilaterally Abdomen: wound vac on abdominal wound , drains and j tube in place , Extremities: extremities normal, atraumatic, no cyanosis or edema Wound: chest tubes in place   Lab Results: CBC: Recent Labs    05/19/20 0500 05/20/20 0518  WBC 18.1* 17.5*  HGB 7.8* 7.8*  HCT 25.3* 25.9*  PLT 400 435*   BMET:  Recent Labs    05/19/20 0500 05/20/20 0518  NA 157* 161*  K 4.1 3.7  CL 118* 128*  CO2 22 26  GLUCOSE 284* 192*  BUN 80* 69*  CREATININE 2.76* 2.48*  CALCIUM 7.3* 7.6*    CMET: Lab Results  Component Value Date   WBC 17.5 (H) 05/20/2020  HGB 7.8 (L) 05/20/2020   HCT 25.9 (L) 05/20/2020   PLT 435 (H) 05/20/2020   GLUCOSE 192 (H) 05/20/2020   CHOL 65 05/07/2020   TRIG 184 (H) 05/18/2020   HDL <10 (L) 05/07/2020   LDLCALC NOT CALCULATED 05/07/2020   ALT 17 05/20/2020   AST 47 (H) 05/20/2020   NA 161 (HH) 05/20/2020   K 3.7 05/20/2020   CL 128 (H) 05/20/2020   CREATININE 2.48 (H) 05/20/2020   BUN 69 (H) 05/20/2020   CO2 26 05/20/2020   TSH 2.236 04/25/2020   INR 1.02 07/30/2014   HGBA1C 7.6 (H) 05/13/2020      PT/INR: No results for input(s): LABPROT, INR in the last 72 hours. Radiology: No results found.   Assessment/Plan: S/P Procedure(s) (LRB): EXPLORATORY LAPAROTOMY (N/A) REMOVE OF  JEJUNOSTOMY TUBE, INSERTATION OF JEJUNOSTOMY AND GASTROSTOMY TUBE  AND ABDOMINAL VAC CHANGE (N/A) reminds critically ill, with AKI, severe elevation NA, tolerating j tube feeding poorly Leave ng in place  May remove one chest tube next 24 hours after repeat chest xray done     Grace Isaac MD      [address] [place], [address] Office [phone]

## 2020-05-20 NOTE — Progress Notes (Addendum)
NAME:  Ethan Johnson, MRN:  [id number], DOB:  01-Mar-1946, LOS: 14 ADMISSION DATE:  05/02/2020, CONSULTATION DATE:  05/07/2020 REFERRING MD:  Janee Morn - Trauma, CHIEF COMPLAINT:  Gastric infarction.   HPI/course in hospital  74 year old man who originally presented 6 days ago with abdominal pain and was found to have urinary retention in the context of worsening symptoms of prostatism and an elevated PSA. Symptoms improved following insertion of Foley and drainage of 1.5L of urine.  SVT at that time which was felt to be sinus tachycardia as no conversion with adenosine. Hypotension and tachycardia improved initially with relief of obstruction.   Found to have Klebsiella bacteremia which was treated with ceftriaxone. Transitioned to orals and was to be discharged with Foley in place for outpatient urology evaluation.  Noted to have gastric distension either due to outlet obstruction or gastroparesis, exacerbated by acute illness.   5/19 developed sudden abdominal pain with tachypnea and desaturation. Loose dark stools.   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   5/20 OR  -  Subtotal gastrectomy with placement of jejunostomy tube. Normotensive throughout the case, minimal blood loss, fluids given.  Returned to ICU intubated.  5/23 OR - Purulent ascites was drained from the abdomen.:  Small bowel all up.  Viable.  Gastrojejunostomy was in good position.  Source was felt to be dehiscence of the proximal staple line at the level of the esophagus.  Patient then underwent thoracotomy and more proximal esophagectomy.  Plan to take patient back tomorrow for further washout.   5/24 OR for ex lap, revision of GJ tube to G tube, placement of feeding jejunostomy, closure of abdominal wall, placement of wound vac.  Past Medical History   Past Medical History:  Diagnosis Date  . Adrenal incidentaloma (HCC)   . Aortic atherosclerosis (HCC)   . BPH (benign prostatic hyperplasia)    . Diabetes mellitus without complication (HCC)   . Hepatic steatosis   . Neuropathy      Past Surgical History:  Procedure Laterality Date  . APPLICATION OF WOUND VAC N/A 2020-06-10   Procedure: Application Of Abthera Wound Vac;  Surgeon: Berna Bue, MD;  Location: [facility] OR;  Service: General;  Laterality: N/A;  . ESOPHAGOGASTRODUODENOSCOPY (EGD) WITH PROPOFOL N/A 05/22/2020   Procedure: ESOPHAGOGASTRODUODENOSCOPY (EGD) WITH PROPOFOL;  Surgeon: Rachael Fee, MD;  Location: [facility] ENDOSCOPY;  Service: Endoscopy;  Laterality: N/A;  . GASTROSTOMY N/A 05/09/2020   Procedure: REMOVE OF JEJUNOSTOMY TUBE, INSERTATION OF JEJUNOSTOMY AND GASTROSTOMY TUBE  AND ABDOMINAL VAC CHANGE;  Surgeon: Manus Rudd, MD;  Location: [facility] OR;  Service: General;  Laterality: N/A;  . LAPAROTOMY N/A 05/14/2020   Procedure: SUBTOTAL GASTRECTOMY, JEJUNOSTOMY TUBE;  Surgeon: Diamantina Monks, MD;  Location: [facility] OR;  Service: General;  Laterality: N/A;  . LAPAROTOMY N/A 2020/06/10   Procedure: EXPLORATORY LAPAROTOMY;  Surgeon: Berna Bue, MD;  Location: [facility] OR;  Service: General;  Laterality: N/A;  . LAPAROTOMY N/A 05/09/2020   Procedure: EXPLORATORY LAPAROTOMY;  Surgeon: Manus Rudd, MD;  Location: [facility] OR;  Service: General;  Laterality: N/A;  . VIDEO ASSISTED THORACOSCOPY (VATS)/THOROCOTOMY Left 06/10/2020   Procedure: Video Assisted Thoracoscopy (Vats)/Thorocotomy;  Surgeon: Linden Dolin, MD;  Location: [facility] OR;  Service: Thoracic;  Laterality: Left;     Interim history/subjective:    Has remained delirious requiring initiation of Precedex and constantly moans in pain with minimal stimulation. Tolerating trickle tube feeds.   Objective  Blood pressure 132/64, pulse 94, temperature (!) 100.8 F (38.2 C), temperature source Axillary, resp. rate (!) 25, height 5\' 10"  (1.778 m), weight 105.7 kg, SpO2 97 %.        Intake/Output Summary (Last 24 hours) at 05/20/2020 0756 Last data filed at 05/20/2020  0700 Gross per 24 hour  Intake 3006.4 ml  Output 4190 ml  Net -1183.6 ml   Filed Weights   05/18/20 0500 05/19/20 0500 05/20/20 0500  Weight: 114 kg 113.5 kg 105.7 kg    Examination: General: Adult male, resting in bed, in NAD. Neuro: Somnolent, moaning only. Will orient to voice. Moves spontaneously HEENT: [place]/AT. Sclerae anicteric. Mucus membranes are dry..  Cardiovascular: RRR, no M/R/G.  Lungs: Left chest tube with no leak.  Tachypnea with shallow breathing.  Abdomen: LUQ G tube drain in place.  RUQ jejunostomy.  BS x 4, soft, NT/ND.  Musculoskeletal: No gross deformities, + edema.  Skin: Intact, warm, no rashes.  Assessment & Plan:   Was critically  ill due to expected acute hypoxic respiratory failure following partial gastrectomy.  Remains at high risk for intubation given mental status. - Analgesia for upper abdominal incision.  - At increased risk of post-operative pulmonary complications given body habitus and location of incision.  - Try to avoid reintubation as will likely be difficult to wean and tracheostomy placement would complicate placement of spit fistula.  Agitated delirium. -Judicious pain medication -Continue Precedex -If tolerating enteral nutrition can start oral sedation as well. - De-medicalizing as much as possible should help with delirium as well.  AKI from critical illness - resolving.  Worsening hypernatremia due to insensitive free water losses and high sodium load from medications. -Increase free water per tube. - Follow BMP. - Increase tube feeds today.  If can tolerate at least 67ml/h of tube feed, would stop TPN to avoid sodium load. - D5W infusion as transition off TPN  - Diuresis will help correct sodium and may decrease abdominal tightness.   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 S/p left chest tube placement following left thoracotomy intraop (Dr. Orvan Seen). - Post op  care per surgery. - Chest tube to water seal. Pain from chest tubes rather than abdomen may be driving delirium.  - per surgery, RUQ G tube is only for drainage (has very small portion of stomach left).  LUQ J tube can be used for feeding/meds/etc. - Leave naso-esophageal tube in place post extubation, this will help with preventing mucus buildup in remaining portion of esophagus.  He will eventually need "spit fistula" before definitive surgical plan down the road (likely at [facility] or [facility]).   Urinary retention due to BPH - Leave Foley in place.  DM. - SSI and levemir.   Daily Goals Checklist  Pain/Anxiety/Delirium protocol (if indicated): Starting seroquel and wean precedex. Hydromorphone for pain.  VAP protocol (if indicated): Extubated. Encourage deep breathing as mental status allows. Respiratory support goals: [place]. Avoid intubation if possible. Blood pressure target: MAP>65. DVT prophylaxis: UFH Nutrition Status: increase feeds to 20 and stop TPN  GI prophylaxis: PPI Fluid status goals: Allow autoregulation.  Urinary catheter: Prostate hypertrophy Central lines: L CVC.  Glucose control: improved with increased Lantus.  Mobility/therapy needs: Bedrest, mobilize as mental status allows. Antibiotic de-escalation: Stop Zosyn and fluconazole to avoid further salt load. WBC is improving.  Home medication reconciliation: on hold Daily labs: CMP/CBC Code Status: Full  Family Communication: daily updates Disposition: ICU.  Lynnell Catalan, MD [facility] ICU Physician [facility] [place] [facility]  Pager: [phone] Mobile: [phone] After hours: [phone].  05/20/2020, 7:56 AM

## 2020-05-20 NOTE — Progress Notes (Signed)
Spoke to E-Link nurse who said she will speak with Dr. Warrick Parisian of E-[facility]. Patient has a fever of over 102, not responding to ice packs. Patient has no PRN pharmacological interventions for fever. This RN requested a PRN for fever if MD deems it appropriate. Awaiting new orders. Will continue to monitor.

## 2020-05-20 NOTE — Progress Notes (Signed)
3 Days Post-Op   Subjective/Chief Complaint: Pt moaning in pain.  Says he is not passing gas, unsure how reliable this is.  BMx2 last 24h  TMAX 101.8 Worsening hypernatremia 161, CCM increasing free water and adjusting TPN Objective: Vital signs in last 24 hours: Temp:  [99.3 F (37.4 C)-101.8 F (38.8 C)] 100.8 F (38.2 C) (05/28 0326) Pulse Rate:  [89-115] 94 (05/28 0700) Resp:  [22-39] 25 (05/28 0700) BP: (95-145)/(45-75) 132/64 (05/28 0700) SpO2:  [86 %-98 %] 97 % (05/28 0700) Weight:  [105.7 kg] 105.7 kg (05/28 0500) Last BM Date: (t)  Intake/Output from previous day: 05/27 0701 - 05/28 0700 In: 3006.4 [I.V.:1787.8; NG/GT:1020.3; IV Piggyback:198.2] Out: 4190 [Urine:3360; Drains:800; Chest Tube:30] Intake/Output this shift: No intake/output data recorded.  Physical Exam:  Gen: alert, ill-appearing black male who appears uncomfortable, moaning in pain  HEENT: NGT in L nare, pupils are equal and round, anicteric sclerae CV: RRR, no m/r/g, HR 91 bpm during my exam  CT with no air leak - 30 cc/24h SS Pulm: 96% on 4L [place], CTAB  Abd: soft, distended, appropriately tender, +BS  G-tube in RUQ to gravity (570cc/24h) bilious  J-tube clamped   LUQ JP 185cc/24h of SS and purulent drainage  MSK: edematous upper and lower extremities Neuro: following commands, no focal deficit   Lab Results:  Recent Labs    05/19/20 0500 05/20/20 0518  WBC 18.1* 17.5*  HGB 7.8* 7.8*  HCT 25.3* 25.9*  PLT 400 435*   BMET Recent Labs    05/19/20 0500 05/20/20 0518  NA 157* 161*  K 4.1 3.7  CL 118* 128*  CO2 22 26  GLUCOSE 284* 192*  BUN 80* 69*  CREATININE 2.76* 2.48*  CALCIUM 7.3* 7.6*   PT/INR No results for input(s): LABPROT, INR in the last 72 hours. ABG Recent Labs    05/14/2020 1302  PHART 7.405  HCO3 25.6    Studies/Results: DG CHEST PORT 1 VIEW  Result Date: 05/19/2020 CLINICAL DATA:  Pneumothorax follow-up EXAM: PORTABLE CHEST 1 VIEW COMPARISON:  Yesterday  FINDINGS: The enteric tube tip is at the lower esophagus with side port over the mid chest. This positioning is intentional based on abdominal CT from 4 days ago the trachea is been extubated. Left IJ line with tip near the left brachiocephalic vein. Left chest tubes in place. Hazy bilateral chest opacity, usually atelectasis and pleural fluid. No visible pneumothorax. IMPRESSION: Atelectasis and pleural effusions by recent CT. There is increased volume loss/opacity after extubation. Electronically Signed   By: Marnee Spring M.D.   On: 05/19/2020 07:24   DG CHEST PORT 1 VIEW  Result Date: 05/18/2020 CLINICAL DATA:  74 year old male on mechanical ventilation. EXAM: PORTABLE CHEST 1 VIEW COMPARISON:  Chest x-ray 05/16/2020. FINDINGS: An endotracheal tube is in place with tip 1.0 cm above the carina. Nasogastric tube the distal esophagus in position with tip projecting over at least 4 cm above the gastroesophageal junction. There is a left-sided internal jugular central venous catheter with tip terminating in the proximal superior vena cava. Three left-sided chest tubes remain in position with tips projecting over the left upper and lower hemithorax. Skin staples projecting over the lower left hemithorax. Lung volumes are low. Bibasilar opacities (left greater than right) which likely reflect areas of atelectasis. Probable small left pleural effusion. No appreciable pneumothorax. No evidence of pulmonary edema. Heart size is normal. Upper mediastinal contours are within normal limits. IMPRESSION: 1. Support apparatus, as above. Advancement of the nasogastric tube  at least 15 cm for more optimal placement is again recommended. 2. Low lung volumes with persistent bibasilar areas of probable subsegmental atelectasis and probable small left pleural effusion. Electronically Signed   By: Vinnie Langton M.D.   On: 05/18/2020 10:00   DG Abd Portable 1V  Result Date: 05/18/2020 CLINICAL DATA:  Jejunostomy tube.  EXAM: PORTABLE ABDOMEN - 1 VIEW COMPARISON:  05/07/2020 FINDINGS: Normal bowel gas pattern. Jejunostomy tube containing contrast as well as contrast in multiple small bowel loops. The tube tip is within a loop of jejunum in the mid to lower abdomen in the midline. There is some gas and stool in normal caliber colon. Lumbar and lower thoracic spine degenerative changes. IMPRESSION: No acute abnormality. Jejunostomy tube tip in a loop of jejunum in the midline. Electronically Signed   By: Claudie Revering M.D.   On: 05/18/2020 16:20    Anti-infectives: Anti-infectives (From admission, onward)   Start     Dose/Rate Route Frequency Ordered Stop   05/18/20 1400  piperacillin-tazobactam (ZOSYN) IVPB 3.375 g     3.375 g 12.5 mL/hr over 240 Minutes Intravenous Every 8 hours 05/18/20 0748 05/22/20 2359   05/02/2020 2200  piperacillin-tazobactam (ZOSYN) IVPB 3.375 g  Status:  Discontinued     3.375 g 12.5 mL/hr over 240 Minutes Intravenous Every 12 hours 05/02/2020 1155 05/18/20 0748   05/16/20 2300  fluconazole (DIFLUCAN) IVPB 200 mg     200 mg 100 mL/hr over 60 Minutes Intravenous Every 24 hours 05/16/20 1247 05/22/20 2359   05/16/20 2000  fluconazole (DIFLUCAN) IVPB 400 mg  Status:  Discontinued     400 mg 100 mL/hr over 120 Minutes Intravenous Every 24 hours 04/30/2020 1936 05/16/20 1247   05/22/2020 2000  fluconazole (DIFLUCAN) IVPB 800 mg     800 mg 200 mL/hr over 120 Minutes Intravenous  Once 05/22/2020 1936 04/29/2020 2339   05/11/2020 1945  fluconazole (DIFLUCAN) IVPB 100 mg  Status:  Discontinued    Note to Pharmacy: Pharmacy may change dose as needed  For intraabdominal infection/ florid leak from esophagogastric junction   100 mg 50 mL/hr over 60 Minutes Intravenous Every 24 hours 05/16/2020 1931 05/16/2020 1936   05/08/2020 0600  piperacillin-tazobactam (ZOSYN) IVPB 3.375 g  Status:  Discontinued     3.375 g 12.5 mL/hr over 240 Minutes Intravenous Every 8 hours 04/27/2020 0544 05/05/2020 1155   05/10/20 0600   ceFAZolin (ANCEF) IVPB 2g/100 mL premix  Status:  Discontinued     2 g 200 mL/hr over 30 Minutes Intravenous Every 8 hours 05/09/20 1542 04/26/2020 0544   05/07/20 1000  cefTRIAXone (ROCEPHIN) 2 g in sodium chloride 0.9 % 100 mL IVPB  Status:  Discontinued     2 g 200 mL/hr over 30 Minutes Intravenous Every 24 hours 04/28/2020 1036 05/09/20 1542   05/02/2020 0745  cefTRIAXone (ROCEPHIN) 2 g in sodium chloride 0.9 % 100 mL IVPB     2 g 200 mL/hr over 30 Minutes Intravenous  Once 05/07/2020 0730 04/30/2020 9983      Assessment/Plan: DM2  Adrenal incidentaloma Urinary retention enlarged prostate Klebsiella bacteremia Hypernatremia - 157 today despite increase in free water AKI - BUN 80/SCr 2.76   Acute hypoxic respiratory failure- resolved, extubated 5/26 Gastric necrosis, gastric volvulus  S/p partial gastrectomy, gastrojejunostomy tube placement - Dr. Bobbye Morton 5/21 S/p exp lap/ left thoracotomy/resection of GE junction and distal esophagus for ongoing necrosis - Dr. Juluis Pitch 04/23/2020 S/p revision of gastrostomy tube/placement of feeding jejunostomy tube/closure of  abdominal wall/ placmenet of wound VAC - Dr. Corliss Skains 05/22/2020  - WBC 17.5 from 18 (stable), TMAX 101.8 yesterday evening - continue nasoesophageal tube to LIWS - NO MEDS per G tube - straight drain only - ok to increase TF to 20 cc/hr, continue reglan today then then stop. Will confirm this plan with MD. - chest tube per TCTS, timing of spit fistula per TCTS - Will eventually need referral to tertiary medical center for reconstruction possibly with colonic interposition graft - dark/black stool not unexpected given recent surgery on stomach/proximal small bowel, monitor. Dulcolax today.   FEN: NPO, TPN, J-tube feeds 20 mL/hr ID: Zosyn VTE: SCD's, SQH Foley: in place 2/2 prostatism with urosepsis    LOS: 14 days    Adam Phenix 05/20/2020

## 2020-05-20 NOTE — Progress Notes (Signed)
CRITICAL VALUE ALERT  Critical Value:  Sodium 161  Date & Time Notied:  05/20/2020 0650  Provider Notified: Dr. Dwain Sarna of surgery.   Orders Received/Actions taken: MD notified, will pass on to day shift RN. No new orders received at this time.

## 2020-05-20 NOTE — Progress Notes (Addendum)
PHARMACY - TOTAL PARENTERAL NUTRITION CONSULT NOTE   Indication: bowel necrosis  Patient Measurements: Height: 5\' 10"  (177.8 cm) Weight: 105.7 kg (233 lb 0.4 oz) IBW/kg (Calculated) : 73 TPN AdjBW (KG): 84.2 Body mass index is 33.44 kg/m.  Assessment: 8 yom originally presenting 5/14 with abdominal pain, urinary retention due to worsening symptoms of prostatism and elevated PSA. Symptoms improved s/p Foley. Klebsiella bacteremia treated with ceftriaxone. 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 Now s/p OR 5/20 for subtotal gastrectomy with placement of jejunostomy tube. Patient then underwent thoracotomy and more proximal esophagectomy. Plan back to OR 5/25. May also need further TCTS procedure. Holding TF. Pharmacy consulted to start TPN.  5/26 PM - patient extubated, reporting increasing abdominal pain with irrigation/trickle feeds through J-tube - no signs of leak. Planning to resume trickle feeds 5/27 + add Reglan.  Glucose / Insulin: hx DM2, A1c 7.6. CBGs 144- 202 on Levemir 25u BID + sSSI q4h (utilized 28 units in last 24 hrs) + 40 units regular insulin in TPN bag Electrolytes: Na up to 161 (started on FW 245ml q6h), Cl 128, corrected Ca WNL, Phos down to 3.1, Mag high 2.6 Renal: AKI - SCr trend back down to 2.48 (1.06 on 5/18), BUN 69 S/p Lasix 60mg  IV x 1 yesterday LFTs / TGs: AST down to 47, ALT wnl (noted on fluconazole), Tbili 1.5, TG 184 (off propofol 5/24) Prealbumin / albumin: Prealbumin <5, albumin 1.1 Intake / Output; MIVF: G-tube output 0 ml/24 hrs; LBM 5/27; UOP 1.3 ml/kg/hr GI Imaging: 5/19 CT abd - stomach distended by air, fluid, ingested contrast with unusual dependent air loculations of gastric fundus, high-grade gastric outlet obstruction, new bilateral pleural  effusions 5/23 CT abd - gas and free fluid in LU abd, scattered free air and moderate pneumoperitoneum, small volume ascites; post-surgical changes at upper range of normal for recent surgery Surgeries / Procedures: 5/21 partial gastrectomy, gastrojejunostomy tube placement 5/23 ex-lap, L thoracotomy/resection of GE junction and distal esophagus for ongoing necrosis, ab-thera vac placement 5/25 abd washout, attempt abdomen closure, revision of jejunostomy tube for nutrition and g-tube for gastric drainage  Central access: double lumen CVC, planning PICC 5/25 TPN start date: May 23, 2020 (TPN not hung 5/24 due to CL tip not in recommended area and PICC not yet placed)  Nutritional Goals (per RD recommendation on 5/21): kCal: 2500-2700, Protein: 150-170g, Fluid: >2L Goal TPN rate is 110 mL/hr (provides 161 g of protein and 2461 kcals per day)  Current Nutrition:  TPN  Pivot 1.5 at 10 ml/hr (resumed 5/27)  Plan:  Increase TPN rate to 90 mL/hr at 1800. Titrate to goal as appropriate. -TPN will provide 132g protein, 260g CHO, 60g SMOF lipids, for 1006 total kCal, meeting 88% of protein and 80% of patient kCal needs. Electrolytes in TPN: remove Na, reduce Mag to 43meq/L, continue reduced K 15mEq/L and phos 5 mmol/L (may consider increasing soon as AKI resolves), standard 74mEq/L of Ca; max acetate Add standard MVI and trace elements to TPN. Remove chromium for now with AKI Increase to 50 units regular insulin in TPN bag + resistant q4h SSI and adjust as needed  Monitor TPN labs on Mon/Thurs and in am F/u toleration of tube feeds and ability to wean TPN  ADDENDUM:  Tube feeds to increase to 20 ml/hr today and d/c  TPN after this bag.  Jeanella Cara, PharmD, [facility] Clinical Pharmacist Please see AMION for all Pharmacists' Contact Phone Numbers 05/20/2020, 7:41 AM

## 2020-05-21 ENCOUNTER — Inpatient Hospital Stay (HOSPITAL_COMMUNITY): Payer: No Typology Code available for payment source

## 2020-05-21 DIAGNOSIS — N139 Obstructive and reflux uropathy, unspecified: Secondary | ICD-10-CM

## 2020-05-21 DIAGNOSIS — R0682 Tachypnea, not elsewhere classified: Secondary | ICD-10-CM

## 2020-05-21 DIAGNOSIS — R109 Unspecified abdominal pain: Secondary | ICD-10-CM

## 2020-05-21 DIAGNOSIS — K311 Adult hypertrophic pyloric stenosis: Secondary | ICD-10-CM

## 2020-05-21 LAB — TYPE AND SCREEN
ABO/RH(D): B NEG
Antibody Screen: NEGATIVE
Unit division: 0
Unit division: 0

## 2020-05-21 LAB — CBC
HCT: 26 % — ABNORMAL LOW (ref 39.0–52.0)
Hemoglobin: 8 g/dL — ABNORMAL LOW (ref 13.0–17.0)
MCH: 26.4 pg (ref 26.0–34.0)
MCHC: 30.8 g/dL (ref 30.0–36.0)
MCV: 85.8 fL (ref 80.0–100.0)
Platelets: 444 10*3/uL — ABNORMAL HIGH (ref 150–400)
RBC: 3.03 MIL/uL — ABNORMAL LOW (ref 4.22–5.81)
RDW: 16.6 % — ABNORMAL HIGH (ref 11.5–15.5)
WBC: 14.7 10*3/uL — ABNORMAL HIGH (ref 4.0–10.5)
nRBC: 1.3 % — ABNORMAL HIGH (ref 0.0–0.2)

## 2020-05-21 LAB — BASIC METABOLIC PANEL
Anion gap: 11 (ref 5–15)
Anion gap: 12 (ref 5–15)
Anion gap: 11 (ref 5–15)
BUN: 60 mg/dL — ABNORMAL HIGH (ref 8–23)
BUN: 58 mg/dL — ABNORMAL HIGH (ref 8–23)
BUN: 56 mg/dL — ABNORMAL HIGH (ref 8–23)
CO2: 25 mmol/L (ref 22–32)
CO2: 23 mmol/L (ref 22–32)
CO2: 20 mmol/L — ABNORMAL LOW (ref 22–32)
Calcium: 7.7 mg/dL — ABNORMAL LOW (ref 8.9–10.3)
Calcium: 7.2 mg/dL — ABNORMAL LOW (ref 8.9–10.3)
Calcium: 7.1 mg/dL — ABNORMAL LOW (ref 8.9–10.3)
Chloride: 125 mmol/L — ABNORMAL HIGH (ref 98–111)
Chloride: 122 mmol/L — ABNORMAL HIGH (ref 98–111)
Chloride: 124 mmol/L — ABNORMAL HIGH (ref 98–111)
Creatinine, Ser: 2.18 mg/dL — ABNORMAL HIGH (ref 0.61–1.24)
Creatinine, Ser: 2.12 mg/dL — ABNORMAL HIGH (ref 0.61–1.24)
Creatinine, Ser: 2.18 mg/dL — ABNORMAL HIGH (ref 0.61–1.24)
GFR calc Af Amer: 33 mL/min — ABNORMAL LOW (ref >60)
GFR calc Af Amer: 34 mL/min — ABNORMAL LOW (ref >60)
GFR calc Af Amer: 33 mL/min — ABNORMAL LOW (ref >60)
GFR calc non Af Amer: 29 mL/min — ABNORMAL LOW (ref >60)
GFR calc non Af Amer: 30 mL/min — ABNORMAL LOW (ref >60)
GFR calc non Af Amer: 29 mL/min — ABNORMAL LOW (ref >60)
Glucose, Bld: 244 mg/dL — ABNORMAL HIGH (ref 70–99)
Glucose, Bld: 283 mg/dL — ABNORMAL HIGH (ref 70–99)
Glucose, Bld: 274 mg/dL — ABNORMAL HIGH (ref 70–99)
Potassium: 3.6 mmol/L (ref 3.5–5.1)
Potassium: 3.6 mmol/L (ref 3.5–5.1)
Potassium: 3.7 mmol/L (ref 3.5–5.1)
Sodium: 161 mmol/L (ref 135–145)
Sodium: 157 mmol/L — ABNORMAL HIGH (ref 135–145)
Sodium: 155 mmol/L — ABNORMAL HIGH (ref 135–145)

## 2020-05-21 LAB — CBC WITH DIFFERENTIAL/PLATELET
Abs Immature Granulocytes: 0 10*3/uL (ref 0.00–0.07)
Basophils Absolute: 0.3 10*3/uL — ABNORMAL HIGH (ref 0.0–0.1)
Basophils Relative: 2 %
Eosinophils Absolute: 0.2 10*3/uL (ref 0.0–0.5)
Eosinophils Relative: 1 %
HCT: 26.1 % — ABNORMAL LOW (ref 39.0–52.0)
Hemoglobin: 7.9 g/dL — ABNORMAL LOW (ref 13.0–17.0)
Lymphocytes Relative: 30 %
Lymphs Abs: 4.8 10*3/uL — ABNORMAL HIGH (ref 0.7–4.0)
MCH: 26.4 pg (ref 26.0–34.0)
MCHC: 30.3 g/dL (ref 30.0–36.0)
MCV: 87.3 fL (ref 80.0–100.0)
Monocytes Absolute: 0.6 10*3/uL (ref 0.1–1.0)
Monocytes Relative: 4 %
Neutro Abs: 10.1 10*3/uL — ABNORMAL HIGH (ref 1.7–7.7)
Neutrophils Relative %: 63 %
Platelets: 438 10*3/uL — ABNORMAL HIGH (ref 150–400)
RBC: 2.99 MIL/uL — ABNORMAL LOW (ref 4.22–5.81)
RDW: 16.7 % — ABNORMAL HIGH (ref 11.5–15.5)
WBC: 16 10*3/uL — ABNORMAL HIGH (ref 4.0–10.5)
nRBC: 0.8 % — ABNORMAL HIGH (ref 0.0–0.2)
nRBC: 6 /100 WBC — ABNORMAL HIGH

## 2020-05-21 LAB — BLOOD GAS, VENOUS
Acid-base deficit: 0.4 mmol/L (ref 0.0–2.0)
Bicarbonate: 22.4 mmol/L (ref 20.0–28.0)
FIO2: 21
O2 Saturation: 99 %
Patient temperature: 37
pCO2, Ven: 28.9 mmHg — ABNORMAL LOW (ref 44.0–60.0)
pH, Ven: 7.502 — ABNORMAL HIGH (ref 7.250–7.430)

## 2020-05-21 LAB — GLUCOSE, CAPILLARY
Glucose-Capillary: 214 mg/dL — ABNORMAL HIGH (ref 70–99)
Glucose-Capillary: 216 mg/dL — ABNORMAL HIGH (ref 70–99)
Glucose-Capillary: 231 mg/dL — ABNORMAL HIGH (ref 70–99)
Glucose-Capillary: 240 mg/dL — ABNORMAL HIGH (ref 70–99)
Glucose-Capillary: 243 mg/dL — ABNORMAL HIGH (ref 70–99)
Glucose-Capillary: 255 mg/dL — ABNORMAL HIGH (ref 70–99)

## 2020-05-21 LAB — POCT I-STAT 7, (LYTES, BLD GAS, ICA,H+H)
Acid-base deficit: 2 mmol/L (ref 0.0–2.0)
Bicarbonate: 20.9 mmol/L (ref 20.0–28.0)
Calcium, Ion: 1.08 mmol/L — ABNORMAL LOW (ref 1.15–1.40)
HCT: 24 % — ABNORMAL LOW (ref 39.0–52.0)
Hemoglobin: 8.2 g/dL — ABNORMAL LOW (ref 13.0–17.0)
O2 Saturation: 96 %
Patient temperature: 98.5
Potassium: 3.6 mmol/L (ref 3.5–5.1)
Sodium: 157 mmol/L — ABNORMAL HIGH (ref 135–145)
TCO2: 22 mmol/L (ref 22–32)
pCO2 arterial: 28.8 mmHg — ABNORMAL LOW (ref 32.0–48.0)
pH, Arterial: 7.467 — ABNORMAL HIGH (ref 7.350–7.450)
pO2, Arterial: 77 mmHg — ABNORMAL LOW (ref 83.0–108.0)

## 2020-05-21 LAB — BPAM RBC
Blood Product Expiration Date: 202106022359
Blood Product Expiration Date: 202106062359
ISSUE DATE / TIME: 202105251126
ISSUE DATE / TIME: 202105251309
Unit Type and Rh: 1700
Unit Type and Rh: 1700

## 2020-05-21 LAB — MAGNESIUM: Magnesium: 2 mg/dL (ref 1.7–2.4)

## 2020-05-21 LAB — PHOSPHORUS: Phosphorus: 3.6 mg/dL (ref 2.5–4.6)

## 2020-05-21 MED ORDER — DEXMEDETOMIDINE HCL IN NACL 400 MCG/100ML IV SOLN
0.0000 ug/kg/h | INTRAVENOUS | Status: DC
  Administered 2020-05-21: 0.4 ug/kg/h via INTRAVENOUS
  Administered 2020-05-22: 1.3 ug/kg/h via INTRAVENOUS
  Administered 2020-05-22 (×2): 1.5 ug/kg/h via INTRAVENOUS
  Administered 2020-05-22: 1 ug/kg/h via INTRAVENOUS
  Filled 2020-05-21 (×5): qty 100

## 2020-05-21 MED ORDER — PHENYLEPHRINE HCL-NACL 10-0.9 MG/250ML-% IV SOLN
0.0000 ug/min | INTRAVENOUS | Status: DC
  Administered 2020-05-21 – 2020-05-22 (×2): 20 ug/min via INTRAVENOUS
  Administered 2020-05-22: 60 ug/min via INTRAVENOUS
  Filled 2020-05-21 (×4): qty 250

## 2020-05-21 MED ORDER — LACTATED RINGERS IV BOLUS: 250.0000 mL | Freq: Once | INTRAVENOUS | Status: DC

## 2020-05-21 MED ORDER — FENTANYL CITRATE (PF) 100 MCG/2ML IJ SOLN
25.0000 ug | INTRAMUSCULAR | Status: DC | PRN
  Filled 2020-05-21: qty 2

## 2020-05-21 MED ORDER — ALBUMIN HUMAN 25 % IV SOLN
25.0000 g | Freq: Once | INTRAVENOUS | Status: AC
  Administered 2020-05-21: 25 g via INTRAVENOUS

## 2020-05-21 MED ORDER — MIDAZOLAM HCL 2 MG/2ML IJ SOLN
2.0000 mg | INTRAMUSCULAR | Status: DC | PRN
  Administered 2020-05-22 (×3): 2 mg via INTRAVENOUS
  Filled 2020-05-21 (×3): qty 2

## 2020-05-21 MED ORDER — PIVOT 1.5 CAL PO LIQD: 1000.0000 mL | ORAL | Status: DC

## 2020-05-21 MED ORDER — NOREPINEPHRINE 4 MG/250ML-% IV SOLN
INTRAVENOUS | Status: AC
  Filled 2020-05-21: qty 250

## 2020-05-21 MED ORDER — TRAVASOL 10 % IV SOLN
INTRAVENOUS | Status: AC
  Filled 2020-05-21: qty 805.2

## 2020-05-21 MED ORDER — DOCUSATE SODIUM 50 MG/5ML PO LIQD
100.0000 mg | Freq: Two times a day (BID) | ORAL | Status: DC
  Filled 2020-05-21: qty 10

## 2020-05-21 MED ORDER — FENTANYL CITRATE (PF) 100 MCG/2ML IJ SOLN
INTRAMUSCULAR | Status: AC
  Filled 2020-05-21: qty 2

## 2020-05-21 MED ORDER — FENTANYL CITRATE (PF) 100 MCG/2ML IJ SOLN
25.0000 ug | INTRAMUSCULAR | Status: DC | PRN
  Administered 2020-05-21 – 2020-05-22 (×5): 100 ug via INTRAVENOUS
  Filled 2020-05-21 (×5): qty 2

## 2020-05-21 MED ORDER — ETOMIDATE 2 MG/ML IV SOLN: 10.0000 mg | Freq: Once | INTRAVENOUS | Status: DC

## 2020-05-21 MED ORDER — MIDAZOLAM HCL 2 MG/2ML IJ SOLN
INTRAMUSCULAR | Status: AC
  Filled 2020-05-21: qty 2

## 2020-05-21 MED ORDER — DEXTROSE 5 % IV SOLN: INTRAVENOUS | Status: DC

## 2020-05-21 MED ORDER — POLYETHYLENE GLYCOL 3350 17 G PO PACK
17.0000 g | PACK | Freq: Every day | ORAL | Status: DC
  Filled 2020-05-21: qty 1

## 2020-05-21 MED ORDER — ALBUMIN HUMAN 5 % IV SOLN
INTRAVENOUS | Status: AC
  Filled 2020-05-21: qty 500

## 2020-05-21 NOTE — Progress Notes (Addendum)
eLink Physician-Brief Progress Note Patient Name: Ethan Johnson DOB: Aug 15, 1946 MRN: [id number]   Date of Service  05/21/2020  HPI/Events of Note  Notified of ABG result 7.46/29/77 on VAC 26/580. Patient breathing over the set rate and intrinsic rate in the low 30s  eICU Interventions  Bring down TV 500, and if continues to tolerate may go down to 440 (6cc/kg IBW). Discussed with bedside RN  PRN Versed ordered     Intervention Category Major Interventions: Respiratory failure - evaluation and management  Darl Pikes 05/21/2020, 10:18 PM

## 2020-05-21 NOTE — Progress Notes (Signed)
eLink Physician-Brief Progress Note Patient Name: Ethan Johnson DOB: 04-24-1946 MRN: [id number]   Date of Service  05/21/2020  HPI/Events of Note  Pt with fever and elevated liver function tests.  eICU Interventions  Cooling blanket PRN fever ordered.        Thomasene Lot [name] 05/21/2020, 12:13 AM

## 2020-05-21 NOTE — Progress Notes (Signed)
4 Days Post-Op   Subjective/Chief Complaint: Patient more alert today, but still somewhat disoriented Having some abdominal pain Febrile overnight, but afebrile today WBC trending down Tube feeds at 20 ml/hr No pressors Significant hypernatremia - receiving free water boluses via J-tube   Objective: Vital signs in last 24 hours: Temp:  [97.6 F (36.4 C)-102.6 F (39.2 C)] 97.6 F (36.4 C) (05/29 0800) Pulse Rate:  [54-133] 103 (05/29 0700) Resp:  [24-41] 25 (05/29 0700) BP: (104-144)/(42-84) 109/52 (05/29 0700) SpO2:  [85 %-100 %] 98 % (05/29 0700) Weight:  [103.3 kg] 103.3 kg (05/29 0500) Last BM Date: 05/19/20  Intake/Output from previous day: 05/28 0701 - 05/29 0700 In: 3118.3 [I.V.:1687.6; NG/GT:1355.7; IV Piggyback:35.1] Out: 9470 [Urine:5525; Drains:605; Chest Tube:10] Intake/Output this shift: No intake/output data recorded.  Gen:  Awake, alert but mildly disoriented HEENT:  nasoesophageal tube in place; needs to be retaped to the nose;  Tube is functioing G-tube on right side - bilious drainage Previous right G-tube site - some purulence noted J-tube - left side; site c/d/i Midline wound - clean; intact Abd - distended; moderately tender JP drain - some purulence noted  Lab Results:  Recent Labs    05/20/20 0518 05/21/20 0626  WBC 17.5* 16.0*  HGB 7.8* 7.9*  HCT 25.9* 26.1*  PLT 435* 438*   BMET Recent Labs    05/20/20 0518 05/21/20 0626  NA 161* 161*  K 3.7 3.6  CL 128* 125*  CO2 26 25  GLUCOSE 192* 244*  BUN 69* 60*  CREATININE 2.48* 2.18*  CALCIUM 7.6* 7.7*   PT/INR No results for input(s): LABPROT, INR in the last 72 hours. ABG No results for input(s): PHART, HCO3 in the last 72 hours.  Invalid input(s): PCO2, PO2  Studies/Results: DG Chest Port 1 View  Result Date: 05/21/2020 CLINICAL DATA:  Chest tube in place EXAM: PORTABLE CHEST 1 VIEW COMPARISON:  05/19/2020 FINDINGS: Multiple left chest tubes are again noted. Left IJ central  line remains present. Enteric tube is tip again overlies the mid chest. Persistent low lung volumes with bilateral pleural effusions and bibasilar atelectasis. Similar cardiomediastinal contours. No definite pneumothorax. IMPRESSION: Stable lines and tubes. No definite pneumothorax. Persistent low lung volumes with bilateral pleural effusions and bibasilar atelectasis. Electronically Signed   By: Macy Mis M.D.   On: 05/21/2020 08:36    Anti-infectives: Anti-infectives (From admission, onward)   Start     Dose/Rate Route Frequency Ordered Stop   05/18/20 1400  piperacillin-tazobactam (ZOSYN) IVPB 3.375 g  Status:  Discontinued     3.375 g 12.5 mL/hr over 240 Minutes Intravenous Every 8 hours 05/18/20 0748 05/20/20 0810   04/26/2020 2200  piperacillin-tazobactam (ZOSYN) IVPB 3.375 g  Status:  Discontinued     3.375 g 12.5 mL/hr over 240 Minutes Intravenous Every 12 hours 05/22/2020 1155 05/18/20 0748   05/16/20 2300  fluconazole (DIFLUCAN) IVPB 200 mg  Status:  Discontinued     200 mg 100 mL/hr over 60 Minutes Intravenous Every 24 hours 05/16/20 1247 05/20/20 0810   05/16/20 2000  fluconazole (DIFLUCAN) IVPB 400 mg  Status:  Discontinued     400 mg 100 mL/hr over 120 Minutes Intravenous Every 24 hours 04/28/2020 1936 05/16/20 1247   04/26/2020 2000  fluconazole (DIFLUCAN) IVPB 800 mg     800 mg 200 mL/hr over 120 Minutes Intravenous  Once 05/04/2020 1936 05/22/2020 2339   05/22/2020 1945  fluconazole (DIFLUCAN) IVPB 100 mg  Status:  Discontinued    Note to  Pharmacy: Pharmacy may change dose as needed  For intraabdominal infection/ florid leak from esophagogastric junction   100 mg 50 mL/hr over 60 Minutes Intravenous Every 24 hours 05/11/2020 1931 05/21/2020 1936   05/14/2020 0600  piperacillin-tazobactam (ZOSYN) IVPB 3.375 g  Status:  Discontinued     3.375 g 12.5 mL/hr over 240 Minutes Intravenous Every 8 hours 05/21/2020 0544 05/03/2020 1155   05/10/20 0600  ceFAZolin (ANCEF) IVPB 2g/100 mL premix   Status:  Discontinued     2 g 200 mL/hr over 30 Minutes Intravenous Every 8 hours 05/09/20 1542 05/13/2020 0544   05/07/20 1000  cefTRIAXone (ROCEPHIN) 2 g in sodium chloride 0.9 % 100 mL IVPB  Status:  Discontinued     2 g 200 mL/hr over 30 Minutes Intravenous Every 24 hours 05/30/20 1036 05/09/20 1542   2020/05/30 0745  cefTRIAXone (ROCEPHIN) 2 g in sodium chloride 0.9 % 100 mL IVPB     2 g 200 mL/hr over 30 Minutes Intravenous  Once 05-30-2020 0730 May 30, 2020 2458      Assessment/Plan: DM2  Adrenal incidentaloma Urinary retention enlarged prostate Klebsiella bacteremia Hypernatremia - 161 today despite increase in free water AKI - slight improvement  Acute hypoxic respiratory failure- resolved, extubated 5/26 Gastric necrosis, gastric volvulus  S/p partial gastrectomy, gastrojejunostomy tube placement - Dr. Bedelia Person 5/21 S/p exp lap/ left thoracotomy/resection of GE junction and distal esophagus for ongoing necrosis - Dr. Lenell Antu 04/27/2020 S/p revision of gastrostomy tube/placement of feeding jejunostomy tube/closure of abdominal wall/ placement of wound VAC - Dr. Corliss Skains 04/26/2020  - WBC trending down, TMAX 102.6 yesterday evening - continue nasoesophageal tube to LIWS - NO MEDS per G tube - straight drain only - ok to increase TF to 30 cc/hr - chest tube per TCTS, timing of spit fistula per TCTS - Will eventually need referral to tertiary medical center for reconstruction possibly with colonic interposition graft - dark/black stool not unexpected given recent surgery on stomach/proximal small bowel, monitor. Dulcolax today.   If he continues to spike fevers, would repeat CT of chest, abdomen, pelvis with IV contrast and PO contrast only through G-tube and j-tube. FEN: NPO, TPN, J-tube feeds 20 mL/hr KD:XIPJA VTE: SCD's, SQH Foley: in place 2/2 prostatism with urosepsis   LOS: 15 days    Wynona Luna 05/21/2020

## 2020-05-21 NOTE — Procedures (Signed)
Arterial Catheter Insertion Procedure Note LETICIA MCDIARMID [id number] 04/14/1946  Procedure: Insertion of Arterial Catheter  Indications: Blood pressure monitoring  Procedure Details Consent: Risks of procedure as well as the alternatives and risks of each were explained to the (patient/caregiver).  Consent for procedure obtained. Time Out: Verified patient identification, verified procedure, site/side was marked, verified correct patient position, special equipment/implants available, medications/allergies/relevent history reviewed, required imaging and test results available.  Performed  Maximum sterile technique was used including antiseptics, cap, gloves, gown, hand hygiene, mask and sheet. Skin prep: Chlorhexidine; local anesthetic administered 22 gauge catheter was inserted into right radial artery using the Seldinger technique. ULTRASOUND GUIDANCE USED: YES Evaluation Blood flow good; BP tracing good. Complications: No apparent complications.   Adewale A Olalere 05/21/2020

## 2020-05-21 NOTE — Progress Notes (Signed)
RT note-Called to room for increased work of breathing, sp02 70's, oral suctioning and NTS performed. Placed on NRB 100%, sp02 now 100%. Dr. Wynona Neat notified, continue to monitor.

## 2020-05-21 NOTE — Progress Notes (Signed)
Portable paged for second time (0041, 0105) to request cooling blanket. Awaiting call back.

## 2020-05-21 NOTE — Progress Notes (Signed)
eLink Physician-Brief Progress Note Patient Name: NANDAN WILLEMS DOB: 22-Oct-1946 MRN: [id number]   Date of Service  05/21/2020  HPI/Events of Note  Called to speak with family regarding code status. Wife and children at bedside when I camera'd in.  They confirmed that they would still want patient to undergo CPR if his heart would stop but would not want him on the ventilator for prolonged a period of time.  eICU Interventions  I discussed with them that he will be assessed on a daily basis regarding liberation from the ventilator. Patient will remain full code for now.     Intervention Category Minor Interventions: Communication with other healthcare providers and/or family  Darl Pikes 05/21/2020, 8:42 PM

## 2020-05-21 NOTE — Procedures (Signed)
Intubation Procedure Note AMINE ADELSON [id number] [birth year]  Procedure: Intubation Indications: Respiratory failure  Received 2 mg of Versed, 50-micrograms fentanyl, 10 of etomidate  Procedure Details Consent: Risks of procedure as well as the alternatives and risks of each were explained to the (patient/caregiver).  Consent for procedure obtained. Time Out: Verified patient identification, verified procedure, site/side was marked, verified correct patient position, special equipment/implants available, medications/allergies/relevent history reviewed, required imaging and test results available.  Performed  Maximum sterile technique was used including cap, gloves, gown and hand hygiene.   Intubated with size 8 endotracheal tube  Evaluation Hemodynamic Status: BP stable throughout; O2 sats: stable throughout Patient's Current Condition: Decompensated post intubation Complications: No apparent complications Patient did tolerate procedure well. Chest X-ray ordered to verify placement.  CXR: tube position acceptable.   Kennedi Lizardo A Terita Hejl 05/21/2020

## 2020-05-21 NOTE — Progress Notes (Signed)
The chaplain responded to a code blue. The chaplain spent time with the family after the code blue. The son was present for the code blue and the chaplain provided him support. The chaplain will pass this patient to the nightime on-call chaplain should any further emergencies occur.  Lavone Neri Chaplain Resident For questions concerning this note please contact me by pager [phone]

## 2020-05-21 NOTE — Progress Notes (Addendum)
NAME:  Ethan Johnson, MRN:  [id number], DOB:  Aug 24, 1946, LOS: 110 ADMISSION DATE:  05/19/2020, CONSULTATION DATE:  04/29/2020 REFERRING MD:  [place] Silos - Trauma, CHIEF COMPLAINT:  Gastric infarction.   HPI/course in hospital  74 year old man who originally presented 6 days ago with abdominal pain and was found to have urinary retention in the context of worsening symptoms of prostatism and an elevated PSA. Symptoms improved following insertion of Foley and drainage of 1.5L of urine.  SVT at that time which was felt to be sinus tachycardia as no conversion with adenosine. Hypotension and tachycardia improved initially with relief of obstruction.   Found to have Klebsiella bacteremia which was treated with ceftriaxone. Transitioned to orals and was to be discharged with Foley in place for outpatient urology evaluation.  Noted to have gastric distension either due to outlet obstruction or gastroparesis, exacerbated by acute illness.   5/19 developed sudden abdominal pain with tachypnea and desaturation. Loose dark stools.   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   5/20 OR  -  Subtotal gastrectomy with placement of jejunostomy tube. Normotensive throughout the case, minimal blood loss, fluids given.  Returned to ICU intubated.  5/23 OR - Purulent ascites was drained from the abdomen.:  Small bowel all up.  Viable.  Gastrojejunostomy was in good position.  Source was felt to be dehiscence of the proximal staple line at the level of the esophagus.  Patient then underwent thoracotomy and more proximal esophagectomy.  Plan to take patient back tomorrow for further washout.   5/24 OR for ex lap, revision of GJ tube to G tube, placement of feeding jejunostomy, closure of abdominal wall, placement of wound vac.  Past Medical History   Past Medical History:  Diagnosis Date  . Adrenal incidentaloma ([place])   . Aortic atherosclerosis ([place])   . BPH (benign prostatic hyperplasia)    . Diabetes mellitus without complication ([place])   . Hepatic steatosis   . Neuropathy      Past Surgical History:  Procedure Laterality Date  . APPLICATION OF WOUND VAC N/A 04/23/2020   Procedure: Application Of Abthera Wound Vac;  Surgeon: Clovis Riley, MD;  Location: [facility];  Service: General;  Laterality: N/A;  . ESOPHAGOGASTRODUODENOSCOPY (EGD) WITH PROPOFOL N/A 05/16/2020   Procedure: ESOPHAGOGASTRODUODENOSCOPY (EGD) WITH PROPOFOL;  Surgeon: Milus Banister, MD;  [facility] ENDOSCOPY;  Service: Endoscopy;  Laterality: N/A;  . GASTROSTOMY N/A 2020/05/29   Procedure: REMOVE OF JEJUNOSTOMY TUBE, INSERTATION OF JEJUNOSTOMY AND GASTROSTOMY TUBE  AND ABDOMINAL VAC CHANGE;  Surgeon: Donnie Mesa, MD;  Location: [facility];  Service: General;  Laterality: N/A;  . LAPAROTOMY N/A 04/24/2020   Procedure: SUBTOTAL GASTRECTOMY, JEJUNOSTOMY TUBE;  Surgeon: Jesusita Oka, MD;  Location: [facility];  Service: General;  Laterality: N/A;  . LAPAROTOMY N/A 05/04/2020   Procedure: EXPLORATORY LAPAROTOMY;  Surgeon: Clovis Riley, MD;  Location: [facility];  Service: General;  Laterality: N/A;  . LAPAROTOMY N/A 29-May-2020   Procedure: EXPLORATORY LAPAROTOMY;  Surgeon: Donnie Mesa, MD;  Location: [facility];  Service: General;  Laterality: N/A;  . VIDEO ASSISTED THORACOSCOPY (VATS)/THOROCOTOMY Left 05/22/2020   Procedure: Video Assisted Thoracoscopy (Vats)/Thorocotomy;  Surgeon: Wonda Olds, MD;  Location: [facility];  Service: Thoracic;  Laterality: Left;     Interim history/subjective:  Tolerating trickle tube feeds.  Still agitated Admits to pain Fever resolved  Objective   Blood pressure (!) 109/52, pulse (!) 103, temperature 97.6  F (36.4 C), temperature source Axillary, resp. rate (!) 25, height 5\' 10"  (1.778 m), weight 103.3 kg, SpO2 98 %.        Intake/Output Summary (Last 24 hours) at 05/21/2020 0956 Last data filed at 05/21/2020 0700 Gross per 24 hour  Intake 3018.28 ml  Output 6140 ml  Net  -3121.72 ml   Filed Weights   05/19/20 0500 05/20/20 0500 05/21/20 0500  Weight: 113.5 kg 105.7 kg 103.3 kg    Examination: General: Adult male, uncomfortable Neuro: Awake, interactive, follows commands HEENT: Dry oral mucosa Cardiovascular: S1-S2 appreciated Lungs: No airleak, no significant effluent from chest tube Abdomen: LUQ G tube drain in place.  RUQ jejunostomy.  BS x 4, soft, NT/ND.  Musculoskeletal: No gross deformities, + edema.  Skin: Intact, warm, no rashes.  Assessment & Plan:   Hypoxic respiratory failure following gastrectomy Remains high risk for reintubation -Continue to support -Pain management as tolerated -Will obtain a venous blood gas  Agitated delirium -Continue Precedex -Judicious use of pain medications  Acute kidney injury from critical illness-resolving Hypernatremia-still 161 -Free water per tube -We will increase D5 water -Part of his hyponatremia may be secondary to insensitive water losses -Cautious diuresis-this should help with the hyponatremia as well  S/p partial gastrectomy, 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  LUQ J tube can be used for feeding/meds/etc. - Leave naso-esophageal tube in place post extubation, this will help with preventing mucus buildup in remaining portion of esophagus.  He will eventually need "spit fistula" before definitive surgical plan down the road (likely at [facility] or [facility]).   Urinary retention -Keep Foley in place  Diabetes -SSI and Levemir  Nutrition -Patient is behind with respect to calorie intake -Doubt if we will be able to get to goal with his tube feeds -We will reinitiate TPN and try and avoid a sodium load with it   Daily Goals Checklist  Pain/Anxiety/Delirium protocol (if indicated): Precedex,  hydromorphone for pain  VAP protocol (if indicated): Not indicated,Encourage deep breathing exercises Respiratory support goals: [place]abb[place]. Avoid intubation if possible. Blood pressure target: MAP>65. DVT prophylaxis: UFH Nutrition Status: increased feeds to 20 , surgery plans increased to 30, reinitiate TPN-already behind nutritional goals GI prophylaxis: PPI Fluid status goals: Allow autoregulation.  Urinary catheter: Prostate hypertrophy Central lines: L CVC.  Glucose control: improved with increased Lantus.  Mobility/therapy needs: Bedrest, mobilize as mental status allows. Antibiotic de-escalation: Stop Zosyn and fluconazole to avoid further salt load. WBC is improving.  Home medication reconciliation: on hold Daily labs: CMP/CBC Code Status: Full  Family Communication: daily updates Disposition: ICU.  The patient is critically ill with multiple organ systems failure and requires high complexity decision making for assessment and support, frequent evaluation and titration of therapies, application of advanced monitoring technologies and extensive interpretation of multiple databases. Critical Care Time devoted to patient care services described in this note independent of APP/resident time (if applicable)  is 35 minutes.   [facility] MD [place] [facility] Personal pager: [phone] If unanswered, please page CCM On-call: [phone]

## 2020-05-21 NOTE — Progress Notes (Signed)
PHARMACY - TOTAL PARENTERAL NUTRITION CONSULT NOTE   Indication: bowel necrosis  Patient Measurements: Height: 5\' 10"  (177.8 cm) Weight: 103.3 kg (227 lb 11.8 oz) IBW/kg (Calculated) : 73 TPN AdjBW (KG): 84.2 Body mass index is 32.68 kg/m.  Assessment: 16 yom originally presenting 5/14 with abdominal pain, urinary retention due to worsening symptoms of prostatism and elevated PSA. Symptoms improved s/p Foley. Klebsiella bacteremia treated with ceftriaxone. 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 Now s/p OR 5/20 for subtotal gastrectomy with placement of jejunostomy tube. Patient then underwent thoracotomy and more proximal esophagectomy. Plan back to OR 5/25. May also need further TCTS procedure. Holding TF. Pharmacy consulted to start TPN.  5/26 PM - patient extubated, reporting increasing abdominal pain with irrigation/trickle feeds through J-tube - no signs of leak. Planning to resume trickle feeds 5/27 + add Reglan.  Glucose / Insulin: hx DM2, A1c 7.6. CBGs 194- 244 + sSSI q4h (utilized 36 units in last 24 hrs) + 20 bid of levimir - was off TPN the last 24 hours Electrolytes: Na up to 161 (started on FW 6/27 q4h + D5W @ 45), Cl 125, corrected Ca WNL, Phos down to 3.1, Mag high 2.6 Renal: AKI - SCr trend back down to 2.18 (1.06 on 5/18), BUN 60  LFTs / TGs: AST down to 47, ALT wnl (noted on fluconazole), Tbili 1.5, TG 184 (off propofol 5/24) Prealbumin / albumin: Prealbumin <5, albumin 1.1 Intake / Output; MIVF: G-tube output 0 ml/24 hrs; LBM 5/27; UOP 2.2 ml/kg/hr, Drain 605 mls GI Imaging: 5/19 CT abd - stomach distended by air, fluid, ingested contrast with unusual dependent air loculations of gastric fundus, high-grade gastric outlet obstruction, new bilateral pleural  effusions 5/23 CT abd - gas and free fluid in LU abd, scattered free air and moderate pneumoperitoneum, small volume ascites; post-surgical changes at upper range of normal for recent surgery Surgeries / Procedures: 5/21 partial gastrectomy, gastrojejunostomy tube placement 5/23 ex-lap, L thoracotomy/resection of GE junction and distal esophagus for ongoing necrosis, ab-thera vac placement 5/25 abd washout, attempt abdomen closure, revision of jejunostomy tube for nutrition and g-tube for gastric drainage  Central access: double lumen CVC, planning PICC 5/25 TPN start date: 05-19-2020 (TPN not hung 5/24 due to CL tip not in recommended area and PICC not yet placed)  Nutritional Goals (per RD recommendation on 5/21): kCal: 2500-2700, Protein: 150-170g, Fluid: >2L Goal TPN rate is 110 mL/hr (provides 161 g of protein and 2461 kcals per day)  Current Nutrition:  TPN  Pivot 1.5 at 20 ml/hr (resumed 5/27)  Plan:  Restart TPN rate to 55 mL/hr at 1800. Titrate to goal as appropriate. -TPN will provide 80g protein, 158g CHO, 37g SMOF lipids, for 1230 total kCal, meeting 50% of protein and 50% of patient kCal needs. Electrolytes in TPN: remove Na, reduced Mag 58meq/L, continue reduced K 46mEq/L and phos 5 mmol/L (may consider increasing soon as AKI resolves), standard 52mEq/L of Ca; max acetate Add standard MVI and trace elements to TPN. Remove chromium for now with AKI 40 units regular insulin in TPN bag + resistant q4h SSI and adjust as needed  D/C Levimir when TPN hung Decrease D5W to 100 ml/hr when TPN hung Monitor TPN labs on Mon/Thurs and in am F/u toleration of tube feeds and ability to wean TPN  Alanda Slim, PharmD, [facility] Clinical Pharmacist Please see AMION for all Pharmacists' Contact Phone Numbers 05/21/2020, 9:59 AM

## 2020-05-21 NOTE — Progress Notes (Addendum)
Called to bedside  Increasing work of breathing Spontaneous desaturations into the 70s, patient was placed on nonrebreathing mask  When I saw the patient he was only minimally responsive, glazed look Decision was made to intubate him  He received 2 mg Versed, 50 mcg fentanyl, 10 mg of etomidate Airway was examined and was intubated with 1 attempt  Blood pressure did drop post intubation  Pressors ordered Lactated Ringer 250 cc bolus  Patient subsequently had PEA arrest Code was called  CPR for 8 minutes ROSC achieved after 8 minutes  Received 300 mg of amiodarone for SVT Mucous plug was removed from airway post resuscitation Currently on Levophed-we will titrate to MAP greater than 65  Chest x-ray shows endotracheal tube appropriately positioned-reviewed by myself  Spontaneous breaths, pupils reactive  Arterial line was placed.  Spouse was in to visit and I updated spouse about events

## 2020-05-22 ENCOUNTER — Inpatient Hospital Stay (HOSPITAL_COMMUNITY): Payer: No Typology Code available for payment source

## 2020-05-22 DIAGNOSIS — R52 Pain, unspecified: Secondary | ICD-10-CM

## 2020-05-22 DIAGNOSIS — G931 Anoxic brain damage, not elsewhere classified: Secondary | ICD-10-CM

## 2020-05-22 LAB — BASIC METABOLIC PANEL
Anion gap: 11 (ref 5–15)
BUN: 54 mg/dL — ABNORMAL HIGH (ref 8–23)
CO2: 19 mmol/L — ABNORMAL LOW (ref 22–32)
Calcium: 7.2 mg/dL — ABNORMAL LOW (ref 8.9–10.3)
Chloride: 125 mmol/L — ABNORMAL HIGH (ref 98–111)
Creatinine, Ser: 2.38 mg/dL — ABNORMAL HIGH (ref 0.61–1.24)
GFR calc Af Amer: 30 mL/min — ABNORMAL LOW (ref >60)
GFR calc non Af Amer: 26 mL/min — ABNORMAL LOW (ref >60)
Glucose, Bld: 212 mg/dL — ABNORMAL HIGH (ref 70–99)
Potassium: 3.6 mmol/L (ref 3.5–5.1)
Sodium: 155 mmol/L — ABNORMAL HIGH (ref 135–145)

## 2020-05-22 LAB — PHOSPHORUS: Phosphorus: 1.9 mg/dL — ABNORMAL LOW (ref 2.5–4.6)

## 2020-05-22 LAB — CBC WITH DIFFERENTIAL/PLATELET
Abs Immature Granulocytes: 0.38 10*3/uL — ABNORMAL HIGH (ref 0.00–0.07)
Basophils Absolute: 0 10*3/uL (ref 0.0–0.1)
Basophils Relative: 0 %
Eosinophils Absolute: 0.1 10*3/uL (ref 0.0–0.5)
Eosinophils Relative: 1 %
HCT: 24.9 % — ABNORMAL LOW (ref 39.0–52.0)
Hemoglobin: 7.9 g/dL — ABNORMAL LOW (ref 13.0–17.0)
Immature Granulocytes: 2 %
Lymphocytes Relative: 31 %
Lymphs Abs: 5.1 10*3/uL — ABNORMAL HIGH (ref 0.7–4.0)
MCH: 26.9 pg (ref 26.0–34.0)
MCHC: 31.7 g/dL (ref 30.0–36.0)
MCV: 84.7 fL (ref 80.0–100.0)
Monocytes Absolute: 0.8 10*3/uL (ref 0.1–1.0)
Monocytes Relative: 5 %
Neutro Abs: 9.9 10*3/uL — ABNORMAL HIGH (ref 1.7–7.7)
Neutrophils Relative %: 61 %
Platelets: 410 10*3/uL — ABNORMAL HIGH (ref 150–400)
RBC: 2.94 MIL/uL — ABNORMAL LOW (ref 4.22–5.81)
RDW: 16.6 % — ABNORMAL HIGH (ref 11.5–15.5)
WBC: 16.3 10*3/uL — ABNORMAL HIGH (ref 4.0–10.5)
nRBC: 1 % — ABNORMAL HIGH (ref 0.0–0.2)

## 2020-05-22 LAB — POCT I-STAT 7, (LYTES, BLD GAS, ICA,H+H)
Acid-base deficit: 2 mmol/L (ref 0.0–2.0)
Bicarbonate: 19.8 mmol/L — ABNORMAL LOW (ref 20.0–28.0)
Calcium, Ion: 1.04 mmol/L — ABNORMAL LOW (ref 1.15–1.40)
HCT: 25 % — ABNORMAL LOW (ref 39.0–52.0)
Hemoglobin: 8.5 g/dL — ABNORMAL LOW (ref 13.0–17.0)
O2 Saturation: 98 %
Patient temperature: 102.2
Potassium: 3.5 mmol/L (ref 3.5–5.1)
Sodium: 157 mmol/L — ABNORMAL HIGH (ref 135–145)
TCO2: 20 mmol/L — ABNORMAL LOW (ref 22–32)
pCO2 arterial: 24.6 mmHg — ABNORMAL LOW (ref 32.0–48.0)
pH, Arterial: 7.521 — ABNORMAL HIGH (ref 7.350–7.450)
pO2, Arterial: 94 mmHg (ref 83.0–108.0)

## 2020-05-22 LAB — GLUCOSE, CAPILLARY
Glucose-Capillary: 166 mg/dL — ABNORMAL HIGH (ref 70–99)
Glucose-Capillary: 196 mg/dL — ABNORMAL HIGH (ref 70–99)
Glucose-Capillary: 255 mg/dL — ABNORMAL HIGH (ref 70–99)
Glucose-Capillary: 281 mg/dL — ABNORMAL HIGH (ref 70–99)

## 2020-05-22 LAB — MAGNESIUM: Magnesium: 2.1 mg/dL (ref 1.7–2.4)

## 2020-05-22 MED ORDER — ORAL CARE MOUTH RINSE
15.0000 mL | OROMUCOSAL | Status: DC
  Administered 2020-05-22 (×7): 15 mL via OROMUCOSAL

## 2020-05-22 MED ORDER — MORPHINE 100MG IN NS 100ML (1MG/ML) PREMIX INFUSION
0.0000 mg/h | INTRAVENOUS | Status: DC
  Administered 2020-05-22: 5 mg/h via INTRAVENOUS
  Administered 2020-05-22: 20 mg/h via INTRAVENOUS
  Filled 2020-05-22 (×2): qty 100

## 2020-05-22 MED ORDER — MIDAZOLAM 50MG/50ML (1MG/ML) PREMIX INFUSION
0.0000 mg/h | INTRAVENOUS | Status: DC
  Administered 2020-05-22: 1 mg/h via INTRAVENOUS
  Filled 2020-05-22: qty 50

## 2020-05-22 MED ORDER — POLYVINYL ALCOHOL 1.4 % OP SOLN
1.0000 [drp] | Freq: Four times a day (QID) | OPHTHALMIC | Status: DC | PRN
  Filled 2020-05-22: qty 15

## 2020-05-22 MED ORDER — CHLORHEXIDINE GLUCONATE 0.12% ORAL RINSE (MEDLINE KIT): 15.0000 mL | Freq: Two times a day (BID) | OROMUCOSAL | Status: DC

## 2020-05-22 MED ORDER — DOCUSATE SODIUM 50 MG/5ML PO LIQD: 100.0000 mg | Freq: Two times a day (BID) | ORAL | Status: DC

## 2020-05-22 MED ORDER — MIDAZOLAM BOLUS VIA INFUSION (WITHDRAWAL LIFE SUSTAINING TX)
2.0000 mg | INTRAVENOUS | Status: DC | PRN
  Filled 2020-05-22: qty 2

## 2020-05-22 MED ORDER — ACETAMINOPHEN 650 MG RE SUPP: 650.0000 mg | Freq: Four times a day (QID) | RECTAL | Status: DC | PRN

## 2020-05-22 MED ORDER — PIVOT 1.5 CAL PO LIQD: 1000.0000 mL | ORAL | Status: DC

## 2020-05-22 MED ORDER — ACETAMINOPHEN 160 MG/5ML PO SOLN
650.0000 mg | Freq: Four times a day (QID) | ORAL | Status: DC | PRN
  Administered 2020-05-22: 650 mg
  Filled 2020-05-22: qty 20.3

## 2020-05-22 MED ORDER — MORPHINE BOLUS VIA INFUSION
5.0000 mg | INTRAVENOUS | Status: DC | PRN
  Filled 2020-05-22: qty 5

## 2020-05-22 MED ORDER — PIVOT 1.5 CAL PO LIQD
1000.0000 mL | ORAL | Status: DC
  Administered 2020-05-22: 1000 mL

## 2020-05-22 MED ORDER — MIDAZOLAM HCL 2 MG/2ML IJ SOLN: 1.0000 mg | INTRAMUSCULAR | Status: DC | PRN

## 2020-05-22 MED ORDER — DEXTROSE 5 % IV SOLN: INTRAVENOUS | Status: DC

## 2020-05-22 MED ORDER — GLYCOPYRROLATE 0.2 MG/ML IJ SOLN: 0.2000 mg | INTRAMUSCULAR | Status: DC | PRN

## 2020-05-22 MED ORDER — POTASSIUM PHOSPHATES 15 MMOLE/5ML IV SOLN
20.0000 mmol | Freq: Once | INTRAVENOUS | Status: AC
  Administered 2020-05-22: 20 mmol via INTRAVENOUS
  Filled 2020-05-22: qty 6.67

## 2020-05-22 MED ORDER — MORPHINE SULFATE (PF) 2 MG/ML IV SOLN: 2.0000 mg | INTRAVENOUS | Status: DC | PRN

## 2020-05-22 MED ORDER — ALBUMIN HUMAN 25 % IV SOLN
25.0000 g | Freq: Four times a day (QID) | INTRAVENOUS | Status: DC
  Administered 2020-05-22: 25 g via INTRAVENOUS
  Filled 2020-05-22: qty 100

## 2020-05-22 MED ORDER — GLYCOPYRROLATE 1 MG PO TABS
1.0000 mg | ORAL_TABLET | ORAL | Status: DC | PRN
  Filled 2020-05-22: qty 1

## 2020-05-22 MED ORDER — DIPHENHYDRAMINE HCL 50 MG/ML IJ SOLN: 25.0000 mg | INTRAMUSCULAR | Status: DC | PRN

## 2020-05-22 MED ORDER — GLYCOPYRROLATE 0.2 MG/ML IJ SOLN
0.2000 mg | INTRAMUSCULAR | Status: DC | PRN
  Administered 2020-05-22: 0.2 mg via INTRAVENOUS
  Filled 2020-05-22: qty 1

## 2020-05-22 MED ORDER — CHLORHEXIDINE GLUCONATE 0.12 % MT SOLN
OROMUCOSAL | Status: AC
  Administered 2020-05-22: 15 mL via OROMUCOSAL
  Filled 2020-05-22: qty 15

## 2020-05-22 MED ORDER — POLYETHYLENE GLYCOL 3350 17 G PO PACK
17.0000 g | PACK | Freq: Every day | ORAL | Status: DC
  Administered 2020-05-22: 17 g via JEJUNOSTOMY

## 2020-05-22 MED ORDER — TRAVASOL 10 % IV SOLN
INTRAVENOUS | Status: DC
  Filled 2020-05-22: qty 805.2

## 2020-05-22 MED ORDER — ACETAMINOPHEN 325 MG PO TABS: 650.0000 mg | ORAL_TABLET | Freq: Four times a day (QID) | ORAL | Status: DC | PRN

## 2020-05-22 MED FILL — Medication: Qty: 1 | Status: AC

## 2020-05-22 NOTE — Progress Notes (Signed)
Neosynephrine restarted around 1100 due to low BP and MAP. CCM aware, Albumin q6h added for support.

## 2020-05-22 NOTE — Progress Notes (Signed)
At bedside to hang TNA.  Instructed by RN to not hang due to withdrawal of care.  Family at bedside.

## 2020-05-22 NOTE — Progress Notes (Signed)
Patient appears to be resting comfortably at this time.

## 2020-05-22 NOTE — Progress Notes (Signed)
PHARMACY - TOTAL PARENTERAL NUTRITION CONSULT NOTE   Indication: bowel necrosis  Patient Measurements: Height: 5\' 10"  (177.8 cm) Weight: 99.6 kg (219 lb 9.3 oz) IBW/kg (Calculated) : 73 TPN AdjBW (KG): 84.2 Body mass index is 31.51 kg/m.  Assessment: 76 yom originally presenting 5/14 with abdominal pain, urinary retention due to worsening symptoms of prostatism and elevated PSA. Symptoms improved s/p Foley. Klebsiella bacteremia treated with ceftriaxone. 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 Now s/p OR 5/20 for subtotal gastrectomy with placement of jejunostomy tube. Patient then underwent thoracotomy and more proximal esophagectomy. Plan back to OR 5/25. May also need further TCTS procedure. Holding TF. Pharmacy consulted to start TPN.  5/26 PM - patient extubated, reporting increasing abdominal pain with irrigation/trickle feeds through J-tube - no signs of leak. Planning to resume trickle feeds 5/27 + add Reglan.  5/29 - s/p PEA arrest -lasted approx 8 mins  Glucose / Insulin: hx DM2, A1c 7.6. CBGs 166- 274 + sSSI q4h (utilized 43 units in last 24 hrs) + 40 units in the TPN (will increase insulin in TPN, but hold on advancing until glucose under better control) Electrolytes: Na up to 161>155 (started on FW 6/29 q4h + D5W @ 100), Cl 125, corrected Ca WNL, Phos down to 1.9, Mag 2.1 Renal: AKI - SCr trend back up to 2.18>2.38 (1.06 on 5/18), BUN 54  LFTs / TGs: AST down to 47, ALT wnl (noted on fluconazole), Tbili 1.5, TG 184 (off propofol 5/24) Prealbumin / albumin: Prealbumin <5, albumin 1.1 Intake / Output; MIVF:Ng output - 200; LBM 5/27; UOP 0.7 ml/kg/hr, Drain 70 mls, chest tube 50 ml GI Imaging: 5/19 CT abd - stomach distended by air, fluid, ingested contrast with unusual  dependent air loculations of gastric fundus, high-grade gastric outlet obstruction, new bilateral pleural effusions 5/23 CT abd - gas and free fluid in LU abd, scattered free air and moderate pneumoperitoneum, small volume ascites; post-surgical changes at upper range of normal for recent surgery Surgeries / Procedures: 5/21 partial gastrectomy, gastrojejunostomy tube placement 5/23 ex-lap, L thoracotomy/resection of GE junction and distal esophagus for ongoing necrosis, ab-thera vac placement 5/25 abd washout, attempt abdomen closure, revision of jejunostomy tube for nutrition and g-tube for gastric drainage  Central access: double lumen CVC, planning PICC 5/25 TPN start date: 05-25-2020 (TPN not hung 5/24 due to CL tip not in recommended area and PICC not yet placed)  Nutritional Goals (per RD recommendation on 5/21): kCal: 2500-2700, Protein: 150-170g, Fluid: >2L Goal TPN rate is 110 mL/hr (provides 161 g of protein and 2461 kcals per day)  Current Nutrition:  TPN  Tube feeds stopped  Plan:  Continue TPN rate to 55 mL/hr at 1800. Titrate to goal as appropriate - will not advance today secondary to elevated glucose; will increase insulin in the TPN bag. -TPN will provide 80g protein, 158g CHO, 37g SMOF lipids, for 1230 total kCal, meeting 50% of protein and 50% of patient kCal needs. Electrolytes in TPN: remove Na, reduced Mag 39meq/L, continue reduced K 64mEq/L and phos 5 mmol/L (may consider increasing soon as AKI resolves), standard 42mEq/L of Ca; max acetate Add standard MVI and trace elements to TPN. Remove chromium for now with AKI Increase insulin to 50 units regular insulin in TPN bag + resistant q4h SSI and adjust as needed  Monitor TPN labs on Mon/Thurs and in am F/u toleration of tube feeds and ability to wean TPN  Alanda Slim, PharmD, [facility] Clinical Pharmacist Please see AMION for all Pharmacists' Contact Phone Numbers 05/22/2020, 7:08 AM

## 2020-05-22 NOTE — Progress Notes (Signed)
NAME:  Ethan Johnson, MRN:  [id number], DOB:  Apr 06, 1946, LOS: 16 ADMISSION DATE:  05/16/2020, CONSULTATION DATE:  04/29/2020 REFERRING MD:  Janee Morn - Trauma, CHIEF COMPLAINT:  Gastric infarction.   HPI/course in hospital  74 year old man who originally presented 6 days ago with abdominal pain and was found to have urinary retention in the context of worsening symptoms of prostatism and an elevated PSA. Symptoms improved following insertion of Foley and drainage of 1.5L of urine.  SVT at that time which was felt to be sinus tachycardia as no conversion with adenosine. Hypotension and tachycardia improved initially with relief of obstruction.   Found to have Klebsiella bacteremia which was treated with ceftriaxone. Transitioned to orals and was to be discharged with Foley in place for outpatient urology evaluation.  Noted to have gastric distension either due to outlet obstruction or gastroparesis, exacerbated by acute illness.   5/19 developed sudden abdominal pain with tachypnea and desaturation. Loose dark stools.   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   5/20 OR  -  Subtotal gastrectomy with placement of jejunostomy tube. Normotensive throughout the case, minimal blood loss, fluids given.  Returned to ICU intubated.  5/23 OR - Purulent ascites was drained from the abdomen.:  Small bowel all up.  Viable.  Gastrojejunostomy was in good position.  Source was felt to be dehiscence of the proximal staple line at the level of the esophagus.  Patient then underwent thoracotomy and more proximal esophagectomy.  Plan to take patient back tomorrow for further washout.   5/24 OR for ex lap, revision of GJ tube to G tube, placement of feeding jejunostomy, closure of abdominal wall, placement of wound vac.  5/29 worsening respiratory failure, acutely intubated, PEA arrest lasting 8 minutes before ROSC  Past Medical History   Past Medical History:  Diagnosis Date  .  Adrenal incidentaloma (HCC)   . Aortic atherosclerosis (HCC)   . BPH (benign prostatic hyperplasia)   . Diabetes mellitus without complication (HCC)   . Hepatic steatosis   . Neuropathy      Past Surgical History:  Procedure Laterality Date  . APPLICATION OF WOUND VAC N/A 06/11/2020   Procedure: Application Of Abthera Wound Vac;  Surgeon: Berna Bue, MD;  Location: [facility] OR;  Service: General;  Laterality: N/A;  . ESOPHAGOGASTRODUODENOSCOPY (EGD) WITH PROPOFOL N/A 05/22/2020   Procedure: ESOPHAGOGASTRODUODENOSCOPY (EGD) WITH PROPOFOL;  Surgeon: Rachael Fee, MD;  Location: [facility] ENDOSCOPY;  Service: Endoscopy;  Laterality: N/A;  . GASTROSTOMY N/A 04/24/2020   Procedure: REMOVE OF JEJUNOSTOMY TUBE, INSERTATION OF JEJUNOSTOMY AND GASTROSTOMY TUBE  AND ABDOMINAL VAC CHANGE;  Surgeon: Manus Rudd, MD;  Location: [facility] OR;  Service: General;  Laterality: N/A;  . LAPAROTOMY N/A 05/01/2020   Procedure: SUBTOTAL GASTRECTOMY, JEJUNOSTOMY TUBE;  Surgeon: Diamantina Monks, MD;  Location: [facility] OR;  Service: General;  Laterality: N/A;  . LAPAROTOMY N/A 2020-06-11   Procedure: EXPLORATORY LAPAROTOMY;  Surgeon: Berna Bue, MD;  Location: [facility] OR;  Service: General;  Laterality: N/A;  . LAPAROTOMY N/A 05/17/2020   Procedure: EXPLORATORY LAPAROTOMY;  Surgeon: Manus Rudd, MD;  Location: [facility] OR;  Service: General;  Laterality: N/A;  . VIDEO ASSISTED THORACOSCOPY (VATS)/THOROCOTOMY Left 06-11-20   Procedure: Video Assisted Thoracoscopy (Vats)/Thorocotomy;  Surgeon: Linden Dolin, MD;  Location: [facility] OR;  Service: Thoracic;  Laterality: Left;     Interim history/subjective:  PEA arrest 05/21/2020-ROSC after 8 minutes  Objective   Blood  pressure (!) 114/44, pulse 90, temperature 99.4 F (37.4 C), temperature source Axillary, resp. rate (!) 31, height 5\' 10"  (1.778 m), weight 99.6 kg, SpO2 100 %.    Vent Mode: PRVC FiO2 (%):  [30 %-100 %] 40 % Set Rate:  [26 bmp] 26 bmp Vt Set:  [500 mL-580 mL]  500 mL PEEP:  [5 cmH20] 5 cmH20 Plateau Pressure:  [14 cmH20] 14 cmH20   Intake/Output Summary (Last 24 hours) at 05/22/2020 1557 Last data filed at 05/22/2020 1500 Gross per 24 hour  Intake 7489.13 ml  Output 1720 ml  Net 5769.13 ml   Filed Weights   05/20/20 0500 05/21/20 0500 05/21/20 1013  Weight: 105.7 kg 103.3 kg 99.6 kg    Examination: General: Adult male, unresponsive Neuro: Unresponsive, breathing above vent, pupils about 69mm, gaze is downwards HEENT: Dry oral mucosa Cardiovascular: S1-S2 appreciated Lungs: Rhonchi bilaterally  abdomen: LUQ G tube drain in place.  RUQ jejunostomy.  BS x 4, soft, NT/ND.  Musculoskeletal: No gross deformities, + edema.  Skin: Intact, warm, no rashes.  Assessment & Plan:   Post PEA arrest 5/29 -8 minutes prior to ROSC -Had pupillary response post ROSC -Breathing above vent -Goals of care discussion ongoing with family -Requested palliative care consult -May have a component of anoxic injury with new neurological findings  Hypoxic respiratory failure following gastrectomy Remains high risk for reintubation -Continue to support -Pain management as tolerated  Agitated delirium -Continue Precedex -Judicious use of pain medications  Respiratory alkalosis -This may be secondary to pain and discomfort -Overbreathing the vent  Acute kidney injury from critical illness-resolving Hypernatremia-still 157 -Free water per tube -We will increase D5 water -Part of his hyponatremia may be secondary to insensitive water losses -Cautious diuresis-this should help with the hyponatremia as well  S/p partial gastrectomy, 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  LUQ J tube can be used for feeding/meds/etc. - Leave naso-esophageal tube in place post  extubation, this will help with preventing mucus buildup in remaining portion of esophagus.  He will eventually need "spit fistula" before definitive surgical plan down the road (likely at [facility] or [facility]).   Urinary retention -Keep Foley in place  Diabetes -SSI and Levemir  Nutrition -Patient is behind with respect to calorie intake-TPN resumed  Daily Goals Checklist  Pain/Anxiety/Delirium protocol (if indicated): Precedex, hydromorphone for pain  VAP protocol (if indicated): Not indicated,Encourage deep breathing exercises Respiratory support goals: Intubated 5/29 Blood pressure target: MAP>65. DVT prophylaxis: UFH Nutrition Status: increased feeds to 20 , reinitiate TPN-already behind nutritional goals GI prophylaxis: PPI Fluid status goals: Allow autoregulation.  Urinary catheter: Prostate hypertrophy Central lines: L CVC.  Glucose control: improved with increased Lantus.  Mobility/therapy needs: Bedrest Antibiotic de-escalation: Stop Zosyn and fluconazole to avoid further salt load. WBC is improving.  Home medication reconciliation: on hold Daily labs: CMP/CBC Code Status: Full  Family Communication: daily updates Disposition: ICU.  The patient is critically ill with multiple organ systems failure and requires high complexity decision making for assessment and support, frequent evaluation and titration of therapies, application of advanced monitoring technologies and extensive interpretation of multiple databases. Critical Care Time devoted to patient care services described in this note independent of APP/resident time (if applicable)  is 33 minutes.   Sherrilyn Rist MD [facility]  pager: [phone] If unanswered, please page CCM On-call: #[phone]

## 2020-05-22 NOTE — Progress Notes (Signed)
Goals of care discussed with patients wife and son at bedside. Explained realistic status and progression, family understands there has been a grave decline since yesterday. Code status discussed given recent events. Wife and son specifically stated they would NOT want chest compressions or ACLS medications to be administered if his heart were to stop beating. Dr. Wynona Neat made aware and plans to come to bedside for conversation.   CDS notified. Pt not considered a candidate; however, if brain death testing is considered, please notify. Otherwise, notify CDS with cardiac TOD.

## 2020-05-22 NOTE — Progress Notes (Signed)
Following conversations with the family regarding ongoing health problems and possibly anoxic injury  Decision was made for compassionate withdrawal of care

## 2020-05-22 NOTE — Progress Notes (Signed)
RT note-Patient has been extubated to room air for comfort care, family at the bedside

## 2020-05-22 NOTE — Progress Notes (Signed)
Nutrition Follow-up  RD working remotely.  DOCUMENTATION CODES:   Obesity unspecified  INTERVENTION:   -TPN management per pharmacy -Continue trickle feedings of Pivot 1.5 @ 20 ml/hr via OGT (480 ml/day)  300 ml free water every 4 hours  Tube feeding regimen provides 720 kcal (29% of needs), 45 grams of protein, and 360 ml of H2O. Total free water: 2160 ml/day  Complete nutrition support regimen (TF + TPN) providing 1950 kcals (78% of estimated kcal needs) and 125 grams protein (83% of estimated protein needs.   NUTRITION DIAGNOSIS:   Increased nutrient needs related to post-op healing as evidenced by estimated needs.  Ongoing  GOAL:   Patient will meet greater than or equal to 90% of their needs  Progressing   MONITOR:   TF tolerance  REASON FOR ASSESSMENT:   Consult Enteral/tube feeding initiation and management  ASSESSMENT:   Pt with PMH of DM, aortic atherosclerosis, BPH, hepatic steatosis, neuropathy, and adrenal incidentaloma admitted with abd pain x 1 week PTA, bladder outlet obstruction, sepsis, uremia/AKI from massively dilated prostate.  5/20 s/p partial gastrectomy, gastrojejunostomy tube placement with nasoesophageal tube, g-tube to LIWS, and J-tube.   5/23 ex-lap, L thoracotomy/resection of GE junction and distal esophagus for ongoing necrosis, ab-thera vac placement -- abdomen left open with further plans for surgery as able 5/24 TNA initiated due to prolonged bowel rest 5/25 s/p revision of gastrostomy tube/placement of feeding jejunostomy tube, closure of abd wall, placement of wound VAC 5/26 extubated, TF held due to increased abdominal pain 5/27 trickle feeds re-started @ 10 ml/hr 5/28  Trickle feeds increased to 20 ml/hr, TPN d/c 5/29- TPN re-started 5/30- re-tintubated secondary to code blue  Patient is currently intubated on ventilator support MV: 17.2 L/min Temp (24hrs), Avg:99.3 F (37.4 C), Min:98.5 F (36.9 C), Max:102.2 F (39  C)  Reviewed I/O's: +4.2 L x 24 hours and -7 L since 05/08/20  UOP: 1.7L x 24 hours  G-tube output: 200 ml x 24 hours  Drain output: 120 ml x 24 hours  Chest tube output: 50 ml x 24 hours  MAP: 74  TPN re-started on 05/21/20; pt currently receiving TPN @ 55 ml/hr, which provides 1230 kcals and 80 grams protein, meeting 45% of estimated kcal needs and 53% of estimated protein needs.   Pt remains on trickle feedings of Pivot 1.5 @ 20 ml/hr via j-tube (g-tube to drainage only). Free water flushes of 300 ml every 4 hours added by MD due to hypernatremia. Complete TF regimen provides 720 kcal (29% of needs), 45 grams of protein, and 360 ml of H2O. Total free water: 2160 ml/day.   Complete nutrition support regimen provides 1950 kcals (78% of estimated kcal needs) and 125 grams protein (83% of estimated protein needs.   Medications reviewed and include colace, miralax, precedex, dextrose 5% solution @ 100 ml/hr, lactated ringers infusion @ 10 ml/htr, neosynephrine, and potassium phosphate.   Labs reviewed: Na: 155, K and Mg WDL, Phos: 1.9 (on IV supplementation), CBGS: 196 (inpatient orders for glycemic control are 0-20 units insulin aspart every 4 hours).   Diet Order:   Diet Order            Diet NPO time specified  Diet effective now              EDUCATION NEEDS:   No education needs have been identified at this time  Skin:  Skin Assessment: Skin Integrity Issues: Skin Integrity Issues:: Incisions  Last BM:  5/20  Height:   Ht Readings from Last 1 Encounters:  05-30-20 5\' 10"  (1.778 m)    Weight:   Wt Readings from Last 1 Encounters:  05/21/20 99.6 kg    Ideal Body Weight:  75.4 kg  BMI:  Body mass index is 31.51 kg/m.  Estimated Nutritional Needs:   Kcal:  2500-2700  Protein:  150-170 grams  Fluid:  >2 L/day    05/09/2020, RD, LDN, CDCES Registered Dietitian II Certified Diabetes Care and Education Specialist Please refer to [facility] for RD and/or RD  on-call/weekend/after hours pager

## 2020-05-22 NOTE — Progress Notes (Signed)
5 Days Post-Op   Subjective/Chief Complaint: Overnight Code events noted Con't with TFs this AM not on pressors   Objective: Vital signs in last 24 hours: Temp:  [98.5 F (36.9 C)] 98.5 F (36.9 C) (05/30 0400) Pulse Rate:  [26-139] 32 (05/30 0700) Resp:  [14-33] 28 (05/30 0700) BP: (79-137)/(41-89) 109/56 (05/30 0700) SpO2:  [86 %-100 %] 86 % (05/30 0748) Arterial Line BP: (61-192)/(43-79) 138/56 (05/30 0630) FiO2 (%):  [30 %-100 %] 30 % (05/30 0748) Weight:  [99.6 kg] 99.6 kg (05/29 1013) Last BM Date: 05/19/20  Intake/Output from previous day: 05/29 0701 - 05/30 0700 In: 6193.9 [I.V.:4573.9; NG/GT:1120; IV Piggyback:500] Out: 2020 [Urine:1650; Emesis/NG output:200; Drains:120; Chest Tube:50] Intake/Output this shift: No intake/output data recorded.   Gen:  sedated, alert but mildly disoriented HEENT:  nasoesophageal tube in place; Tube is functioing G-tube on right side - bilious drainage Previous right G-tube site - some purulence noted J-tube - left side; site c/d/i Midline wound - clean; intact Abd - distended; moderately tender JP drain - some purulence noted  Lab Results:  Recent Labs    05/21/20 2000 05/21/20 2000 05/21/20 2100 05/22/20 0500  WBC 14.7*  --   --  16.3*  HGB 8.0*   < > 8.2* 7.9*  HCT 26.0*   < > 24.0* 24.9*  PLT 444*  --   --  410*   < > = values in this interval not displayed.   BMET Recent Labs    05/21/20 2000 05/21/20 2000 05/21/20 2100 05/22/20 0500  NA 155*   < > 157* 155*  K 3.7   < > 3.6 3.6  CL 124*  --   --  125*  CO2 20*  --   --  19*  GLUCOSE 274*  --   --  212*  BUN 56*  --   --  54*  CREATININE 2.18*  --   --  2.38*  CALCIUM 7.1*  --   --  7.2*   < > = values in this interval not displayed.   PT/INR No results for input(s): LABPROT, INR in the last 72 hours. ABG Recent Labs    05/21/20 1106 05/21/20 2100  PHART  --  7.467*  HCO3 22.4 20.9    Studies/Results: DG CHEST PORT 1 VIEW  Result Date:  05/22/2020 CLINICAL DATA:  Cuff leak, endotracheal tube placement EXAM: PORTABLE CHEST 1 VIEW COMPARISON:  05/21/2020 FINDINGS: The carina is not well seen. Endotracheal tube appears approximately 5-6 cm above. Unchanged left IJ central line. Left chest tubes again seen. Enteric tube tip again overlies the mid chest. Low lung volumes.  No pneumothorax.  Bibasilar atelectasis. IMPRESSION: Lines and tubes as above. Low lung volumes with bibasilar atelectasis. Electronically Signed   By: Guadlupe Spanish M.D.   On: 05/22/2020 07:40   DG CHEST PORT 1 VIEW  Result Date: 05/21/2020 CLINICAL DATA:  Encounter for intubation Patient did code just before xray and need CPR. EXAM: PORTABLE CHEST 1 VIEW COMPARISON:  Chest radiograph 05/21/2020 at 11 a.m. FINDINGS: Interval intubation with endotracheal tube between the thoracic inlet and carina. A nasogastric tube is again seen with tip projecting over the mid chest, similar to prior. A left central venous catheter and left chest tubes are unchanged. Low lung volumes. Improved bibasilar atelectasis. No definite effusions. No pneumothorax. No acute finding in the visualized skeleton. IMPRESSION: 1. Interval intubation with endotracheal tube between the thoracic inlet and carina. Otherwise stable lines and tubes. 2.  Improved bibasilar atelectasis and effusions. Electronically Signed   By: Audie Pinto M.D.   On: 05/21/2020 19:01   DG Chest Port 1 View  Result Date: 05/21/2020 CLINICAL DATA:  Chest tube in place EXAM: PORTABLE CHEST 1 VIEW COMPARISON:  05/19/2020 FINDINGS: Multiple left chest tubes are again noted. Left IJ central line remains present. Enteric tube is tip again overlies the mid chest. Persistent low lung volumes with bilateral pleural effusions and bibasilar atelectasis. Similar cardiomediastinal contours. No definite pneumothorax. IMPRESSION: Stable lines and tubes. No definite pneumothorax. Persistent low lung volumes with bilateral pleural effusions  and bibasilar atelectasis. Electronically Signed   By: Macy Mis M.D.   On: 05/21/2020 08:36    Anti-infectives: Anti-infectives (From admission, onward)   Start     Dose/Rate Route Frequency Ordered Stop   05/18/20 1400  piperacillin-tazobactam (ZOSYN) IVPB 3.375 g  Status:  Discontinued     3.375 g 12.5 mL/hr over 240 Minutes Intravenous Every 8 hours 05/18/20 0748 05/20/20 0810   04/25/2020 2200  piperacillin-tazobactam (ZOSYN) IVPB 3.375 g  Status:  Discontinued     3.375 g 12.5 mL/hr over 240 Minutes Intravenous Every 12 hours 05/07/2020 1155 05/18/20 0748   05/16/20 2300  fluconazole (DIFLUCAN) IVPB 200 mg  Status:  Discontinued     200 mg 100 mL/hr over 60 Minutes Intravenous Every 24 hours 05/16/20 1247 05/20/20 0810   05/16/20 2000  fluconazole (DIFLUCAN) IVPB 400 mg  Status:  Discontinued     400 mg 100 mL/hr over 120 Minutes Intravenous Every 24 hours May 19, 2020 1936 05/16/20 1247   19-May-2020 2000  fluconazole (DIFLUCAN) IVPB 800 mg     800 mg 200 mL/hr over 120 Minutes Intravenous  Once May 19, 2020 1936 05/19/20 2339   May 19, 2020 1945  fluconazole (DIFLUCAN) IVPB 100 mg  Status:  Discontinued    Note to Pharmacy: Pharmacy may change dose as needed  For intraabdominal infection/ florid leak from esophagogastric junction   100 mg 50 mL/hr over 60 Minutes Intravenous Every 24 hours 05/19/2020 1931 05/19/20 1936   May 19, 2020 0600  piperacillin-tazobactam (ZOSYN) IVPB 3.375 g  Status:  Discontinued     3.375 g 12.5 mL/hr over 240 Minutes Intravenous Every 8 hours 05/19/2020 0544 04/30/2020 1155   05/10/20 0600  ceFAZolin (ANCEF) IVPB 2g/100 mL premix  Status:  Discontinued     2 g 200 mL/hr over 30 Minutes Intravenous Every 8 hours 05/09/20 1542 May 19, 2020 0544   05/07/20 1000  cefTRIAXone (ROCEPHIN) 2 g in sodium chloride 0.9 % 100 mL IVPB  Status:  Discontinued     2 g 200 mL/hr over 30 Minutes Intravenous Every 24 hours 05/14/2020 1036 05/09/20 1542   04/26/2020 0745  cefTRIAXone (ROCEPHIN)  2 g in sodium chloride 0.9 % 100 mL IVPB     2 g 200 mL/hr over 30 Minutes Intravenous  Once 05/18/2020 0730 04/25/2020 1884      Assessment/Plan: DM2  Adrenal incidentaloma Urinary retention enlarged prostate Klebsiella bacteremia Hypernatremia - 161 today despite increase in free water AKI - slight improvement  Acute hypoxic respiratory failure- resolved, extubated 5/26 Gastric necrosis, gastric volvulus  S/p partial gastrectomy, gastrojejunostomy tube placement - Dr. Bobbye Morton 5/21 S/p exp lap/ left thoracotomy/resection of GE junction and distal esophagus for ongoing necrosis - Dr. Juluis Pitch 05/19/20 S/p revision of gastrostomy tube/placement of feeding jejunostomy tube/closure of abdominal wall/ placement of wound VAC - Dr. Georgette Dover 05/05/2020 - Jakarri up after code,  - continue nasoesophageal tube to LIWS - NO MEDS  per G tube - straight drain only -keep TF @20  cc/hr - chest tube per TCTS, timing of spit fistula per TCTS - Will eventually need referral to tertiary medical center for reconstruction possibly with colonic interposition graft - dark/black stool not unexpected given recent surgery on stomach/proximal small bowel, monitor. Dulcolax today.   If he continues to spike fevers, would repeat CT of chest, abdomen, pelvis with IV contrast and PO contrast only through G-tube and j-tube. FEN: NPO,TPN,J-tube feeds 20 mL/hr VTE: SCD's, SQH Foley: in place 2/2 prostatism with urosepsis   LOS: 16 days    RS:WNIOE 05/22/2020

## 2020-05-22 NOTE — Progress Notes (Signed)
eLink Physician-Brief Progress Note Patient Name: Ethan Johnson DOB: [birth year] MRN: [id number]   Date of Service  05/22/2020  HPI/Events of Note  ?Need to transfer this comfort care pt to [address] due to need of neuro beds    eICU Interventions  Discussed with RN Maisie Fus. Notes reviewed. Ok to transfer . Comfort care.      Intervention Category Minor Interventions: Communication with other healthcare providers and/or family;Other:  Ranee Gosselin 05/22/2020, 10:54 PM

## 2020-05-22 NOTE — Plan of Care (Signed)
  Problem: Clinical Measurements: Goal: Ability to maintain clinical measurements within normal limits will improve Outcome: Progressing   

## 2020-05-23 DIAGNOSIS — B961 Klebsiella pneumoniae [K. pneumoniae] as the cause of diseases classified elsewhere: Secondary | ICD-10-CM

## 2020-05-23 DIAGNOSIS — R7881 Bacteremia: Secondary | ICD-10-CM

## 2020-05-23 DIAGNOSIS — K3189 Other diseases of stomach and duodenum: Secondary | ICD-10-CM

## 2020-05-24 LAB — CALCIUM, IONIZED: Calcium, Ionized, Serum: 4.6 mg/dL (ref 4.5–5.6)

## 2020-05-24 NOTE — Progress Notes (Signed)
Confirmed Cardiac time of death of 26-Apr-2023 with Fuller Song Rn and Lily Peer RN.

## 2020-05-24 NOTE — Progress Notes (Signed)
Called and spoke with wife Shrell and informed her that patient would be moved to [address].

## 2020-05-24 NOTE — Progress Notes (Signed)
Wasted 60 cc of versed 1 mg per ml and 100 ml of morphine 1 mg ml with Octavia Bruckner.

## 2020-05-24 NOTE — Progress Notes (Signed)
[facility]S notified of [id number]. Ref # [id number], Spoke with Ethan Johnson, pt's body with possible tissue donation only

## 2020-05-24 NOTE — Progress Notes (Signed)
Chaplain provided support to Barnabas's wife and son at bedside.  Wife and son verbalized having prepared themselves for this moment.  Chaplain prayed with family around [place] and learned about him as they were able to share.  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  Family also shared that they were able to carry out Brennyn's wishes because they had been able to have conversations about what he desired.  Chaplain affirmed the ways in which they were able to honor him.  Chaplain offered ministries of presence and prayer.  Chaplain will follow-up as needed.

## 2020-05-24 DEATH — deceased

## 2020-06-23 NOTE — Discharge Summary (Signed)
DEATH SUMMARY   Patient Details  Name: Ethan Johnson MRN: [id number] DOB: 1946-06-04  Admission/Discharge Information   Admit Date:  30-May-2020  Date of Death: Date of Death: 2020-06-16  Time of Death: Time of Death: 2023/05/11  Length of Stay: 2023-05-04  Referring Physician: [place]   Reason(s) for Hospitalization  Patient was admitted to the hospital with complaints of abdominal pain He was being managed for BPH, worsening symptoms.  It was his abdominal pain and discomfort that led to hospitalization.  Diagnoses  Preliminary cause of death: Hypoxemic respiratory failure Secondary Diagnoses (including complications and co-morbidities):  Active Problems:   Obstructive uropathy   Gastric outlet obstruction   Abdominal pain   Pain   Tachypnea   Acute respiratory failure with hypoxia (HCC) Cardiorespiratory failure Gastric volvulus BPH Klebsiella pneumonia bacteremia Gastric volvulus with gastric necrosis S/p partial gastrectomy, gastrojejunostomy with tube placement S/p left thoracotomy, resection of gastroesophageal junction and distal esophagus for necrosis  Brief Hospital Course (including significant findings, care, treatment, and services provided and events leading to death)  Ethan Johnson is a 74 y.o. year old male who Benign prostatic hypertrophy with progressive symptoms. Presented to the hospital with abdominal pain and discomfort.  He did have lactic acidosis, presumed sepsis from urinary tract infection.  Acute kidney injury. He developed massively dilated stomach with possible gastric pneumatosis 5/19.  He had EGD on June 05, 2020. EGD was noted for volvulus with necrotic appearing mucosa-subsequently underwent exploratory laparotomy with partial gastrectomy, gastrojejunostomy tube placement. He was on a ventilator for respiratory failure.  Extubated 05/14/2020, return to the operating room for drainage of purulent ascites on 5/23.Source of abdominal collection was felt  to be dehiscence of the proximal staple line at the level of the esophagus, he then underwent thoracotomy a more proximal esophagectomy. On 5/25 went back to the operating room for exploratory laparotomy, revision of GJ tube to G-tube, placement of feeding jejunostomy, closure of abdominal wall, placement of wound VAC. Extubated 5/26, continue to have significant abdominal pain and discomfort, delirium requiring initiation of Precedex, abdominal pain continues to be severe.  Started to tolerate trickle feeding. Increased work of breathing, delirium, severe pain and discomfort Decompensated further on 5/29 with worsening mental status Decision was made to reintubate with what was then mentation Emergently reintubated on 05/21/2020 at about 7:20 PM He became hypotensive, bradycardic post intubation and sustained a PEA arrest following which a CODE BLUE was called CPR lasted for 8 minutes, he required pressors.  Discussions had with the family regarding goals of care On 05/22/2020 noted to have some loss of brainstem functions with concern for anoxic encephalopathy, following further discussions with the family, compassionate withdrawal of care was effected.  Patient succumbed to his illness Jun 16, 2020 at [place] [facility]  Significant Diagnostic Studies CT ABDOMEN PELVIS WO CONTRAST  Result Date: 05/20/2020 CLINICAL DATA:  Abdominal pain/distension, status post subtotal gastrectomy for gastric necrosis on 05/20 EXAM: CT ABDOMEN AND PELVIS WITHOUT CONTRAST TECHNIQUE: Multidetector CT imaging of the abdomen and pelvis was performed following the standard protocol without IV contrast. COMPARISON:  05/11/2020 FINDINGS: Motion degraded images. Lower chest: Moderate bilateral pleural effusions, left greater than right. Associated lower lobe atelectasis. Hepatobiliary: Unenhanced liver is unremarkable. Layering small gallstones (series 3/image 41), without associated inflammatory changes.  Pancreas: Within normal limits. Spleen: Within normal limits. Adrenals/Urinary Tract: Right adrenal gland is within normal limits. Stable 3.3 cm left adrenal mass (series 3/image 32), previously characterized as a benign  adrenal adenoma. Kidneys are within normal limits.  No hydronephrosis. Bladder decompressed by indwelling Foley catheter. Mildly thick-walled with nondependent gas. Stomach/Bowel: Enteric tube terminates in the distal esophagus (series 3/image 24). Status post subtotal gastrectomy. Patient remains in discontinuity. Residual stomach is reportedly present but poorly visualized. Percutaneous (gastro)jejunostomy is present in the anterior mid abdomen (series 3/image 45), and the enteric tube terminates in the 2nd/3rd portion of the duodenum (series 3/image 48). Gas and fluid in the left upper abdomen (series 3/image 18), without a well-defined collection at the current time. Additional scattered free air and moderate pneumoperitoneum beneath the anterior abdominal wall (series 3/image 34). While there is an adjacent suture line related to the reported gastrojejunostomy, the pneumoperitoneum itself is nondependent. Overall, these findings are within the upper range of normal for recent surgery, particularly given gastric necrosis, and do not by themselves indicate anastomotic breakdown/leak. Visualized bowel is otherwise unremarkable, including a normal appendix (series 3/image 84). Vascular/Lymphatic: No evidence of abdominal aortic aneurysm. Atherosclerotic calcifications of the abdominal aorta and branch vessels. No suspicious abdominopelvic lymphadenopathy. Reproductive: Marked prostatomegaly, suggesting BPH. Other: Moderate pneumoperitoneum, as described above. Gas and fluid in the left upper abdomen, as described above. Surgical drain terminating in the portacaval region (series 3/image 32). Additional small volume abdominal ascites. Musculoskeletal: Degenerative changes of the visualized  thoracolumbar spine. IMPRESSION: Postsurgical changes related to subtotal gastrectomy. Discontinuous residual stomach with percutaneous gastrojejunostomy terminating in the mid duodenum. Associated gas and fluid in the left upper abdomen, without well-defined collection at the current time. Scattered free air and moderate pneumoperitoneum. Additional small volume abdominal ascites. Overall, these findings are considered within the upper range of normal for the recent surgery. Surgical drain terminating in the portacaval region. These results were called by telephone at the time of interpretation on 04/24/2020 at 7:16 am to provider Dr Phylliss Blakes, who verbally acknowledged these results. Electronically Signed   By: Charline Bills M.D.   On: 04/27/2020 07:18   CT ABDOMEN PELVIS WO CONTRAST  Result Date: 05/14/2020 CLINICAL DATA:  Unspecified abdominal pain EXAM: CT ABDOMEN AND PELVIS WITHOUT CONTRAST TECHNIQUE: Multidetector CT imaging of the abdomen and pelvis was performed following the standard protocol without IV contrast. COMPARISON:  Abdominal MRI 11/30/2019 FINDINGS: Lower chest:  Coronary atherosclerosis. Hepatobiliary: Hepatic steatosis. There is motion artifact.No evidence of biliary obstruction or stone. Pancreas: Unremarkable. Spleen: Unremarkable. Adrenals/Urinary Tract: 3.4 cm left adrenal mass characterized as adenoma on 2020 abdominal MRI. Distended urinary bladder with associated bilateral hydroureteronephrosis and prominent perinephric stranding. No obstructing stone. Stomach/Bowel:  No obstruction. No visible bowel inflammation. Vascular/Lymphatic: No acute vascular abnormality. Aortic atherosclerosis. No mass or adenopathy. Reproductive:Marked enlargement of the prostate which uplifts the bladder and measures 9 cm craniocaudal. Other: No ascites or pneumoperitoneum. Musculoskeletal: No acute abnormalities. Severe bilateral hip osteoarthritis. Generalized spondylosis. IMPRESSION: 1. Over  distended bladder leading to bilateral hydroureteronephrosis. Related marked prostate enlargement with 10 cm craniocaudal span. 2. Hepatic steatosis. 3.  Aortic Atherosclerosis (ICD10-I70.0). Coronary atherosclerosis. 4. Motion artifact. Electronically Signed   By: Marnee Spring M.D.   On: 04/25/2020 06:21   DG Abd 1 View - KUB  Result Date: 05/14/2020 CLINICAL DATA:  Abdominal pain. Exam was ordered to rule out pneumoperitoneum. EXAM: ABDOMEN - 1 VIEW COMPARISON:  May 11, 2020 FINDINGS: Limited view of the abdomen in supine position, with exclusion of the hemidiaphragms demonstrates nonobstructive bowel gas pattern. Paucity of gas is noted. The tip of probable enteric catheter overlies the lower mid thorax. Jejunostomy tube is  seen it in the upper abdomen. IMPRESSION: 1. Limited view of the abdomen in supine position, with exclusion of the hemidiaphragms demonstrates nonobstructive bowel gas pattern. 2. The tip of probable enteric catheter overlies the lower mid thorax. 3. No frank pneumoperitoneum is seen, however this is a very limited view of the abdomen. If pneumoperitoneum is considered clinically, decubital or semi upright (if possible) view of the abdomen may be considered. Electronically Signed   By: Ted Mcalpine M.D.   On: 05/14/2020 12:18   DG Abd 1 View  Result Date: 05/11/2020 CLINICAL DATA:  Abdominal pain with nausea and vomiting EXAM: ABDOMEN - 1 VIEW COMPARISON:  May 06, 2020 FINDINGS: Stomach is distended with air. There is no appreciable small or large bowel dilatation. No air-fluid levels. No free air. Visualized lung bases clear. 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 IMPRESSION: Diffuse gastric dilatation with air. No small or large bowel dilatation. No free air. The appearance of the stomach raises concern for a degree of gastric outlet obstruction. 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 Electronically Signed   By: Bretta Bang III M.D.   On: 05/11/2020 08:50   CT ABDOMEN PELVIS W CONTRAST  Result Date: 05/11/2020 CLINICAL DATA:  Bowel obstruction suspected, gastric outlet obstruction, worsening abdominal pain and distension EXAM: CT ABDOMEN AND PELVIS WITH CONTRAST TECHNIQUE: Multidetector CT imaging of the abdomen and pelvis was performed using the standard protocol following bolus administration of intravenous contrast. CONTRAST:  OMNIPAQUE IOHEXOL 300 MG/ML SOLN, additional oral enteric contrast COMPARISON:  CT abdomen, 05/22/2020, MR abdomen, 11/30/2019 FINDINGS: Lower chest: New small to moderate bilateral pleural effusions and associated atelectasis or consolidation. Hepatobiliary: No solid liver abnormality is seen. No gallstones, gallbladder wall thickening, or biliary dilatation. Pancreas: Unremarkable. No pancreatic ductal dilatation or surrounding inflammatory changes. Spleen: Normal in size without significant abnormality. Adrenals/Urinary Tract: Benign left adrenal adenoma, previously characterized by MR. Mild bilateral hydronephrosis and hydroureter, similar to prior examination. The urinary bladder is thickened and decompressed by a Foley catheter. Stomach/Bowel: The stomach is distended by air, fluid, and ingested contrast, with somewhat unusual non dependent air loculations of the gastric fundus, likely related to layering ingested food material. Appendix appears normal. No evidence of bowel wall thickening, distention, or inflammatory changes. Vascular/Lymphatic: Aortic atherosclerosis. There are abnormally enlarged retroperitoneal, bilateral iliac, and pelvic sidewall lymph nodes, enlarged compared to prior examination dated 04/27/2020. The largest right iliac node measures 2.5 x 1.5 cm (series 2, image 64). Reproductive: Gross prostatomegaly. Other: Anasarca. Trace ascites. Musculoskeletal: No acute or significant  osseous findings. IMPRESSION: 1. No definite CT evidence of bowel or gastric outlet obstruction the stomach is distended by air, fluid, and ingested contrast, with somewhat unusual dependent air loculations of the gastric fundus, concerning for pneumatosis although possibly related to ingested material as true gastric pneumatosis is unusual. Consider short interval follow-up CT or surgical consultation as indicated by clinical concern. 2. There is oral enteric contrast passage to the small bowel, excluding high-grade gastric outlet obstruction. 3. New small to moderate bilateral pleural effusions and associated atelectasis or consolidation. 4. There are abnormally enlarged retroperitoneal, bilateral iliac, and pelvic sidewall lymph nodes, significantly enlarged compared to prior examination dated 04/23/2020 and likely reactive given short interval change. 5. Gross prostatomegaly. The urinary bladder is thickened and decompressed by a Foley catheter. Mild bilateral hydronephrosis and hydroureter, similar to  prior examination likely related to chronic outlet obstruction and back pressure. 6. Small volume ascites, anasarca, and pleural effusions. 7. Aortic Atherosclerosis (ICD10-I70.0). These results will be called to the ordering clinician or representative by the Radiologist Assistant, and communication documented in the PACS or [employer]board. Electronically Signed   By: Lauralyn PrimesAlex  Bibbey M.D.   On: 05/11/2020 16:59   DG CHEST PORT 1 VIEW  Result Date: 05/22/2020 CLINICAL DATA:  Cuff leak, endotracheal tube placement EXAM: PORTABLE CHEST 1 VIEW COMPARISON:  05/21/2020 FINDINGS: The carina is not well seen. Endotracheal tube appears approximately 5-6 cm above. Unchanged left IJ central line. Left chest tubes again seen. Enteric tube tip again overlies the mid chest. Low lung volumes.  No pneumothorax.  Bibasilar atelectasis. IMPRESSION: Lines and tubes as above. Low lung volumes with bibasilar atelectasis.  Electronically Signed   By: Guadlupe SpanishPraneil  Patel M.D.   On: 05/22/2020 07:40   DG CHEST PORT 1 VIEW  Result Date: 05/21/2020 CLINICAL DATA:  Encounter for intubation Patient did code just before xray and need CPR. EXAM: PORTABLE CHEST 1 VIEW COMPARISON:  Chest radiograph 05/21/2020 at 11 a.m. FINDINGS: Interval intubation with endotracheal tube between the thoracic inlet and carina. A nasogastric tube is again seen with tip projecting over the mid chest, similar to prior. A left central venous catheter and left chest tubes are unchanged. Low lung volumes. Improved bibasilar atelectasis. No definite effusions. No pneumothorax. No acute finding in the visualized skeleton. IMPRESSION: 1. Interval intubation with endotracheal tube between the thoracic inlet and carina. Otherwise stable lines and tubes. 2.  Improved bibasilar atelectasis and effusions. Electronically Signed   By: Emmaline KluverNancy  Ballantyne M.D.   On: 05/21/2020 19:01   DG Chest Port 1 View  Result Date: 05/21/2020 CLINICAL DATA:  Chest tube in place EXAM: PORTABLE CHEST 1 VIEW COMPARISON:  05/19/2020 FINDINGS: Multiple left chest tubes are again noted. Left IJ central line remains present. Enteric tube is tip again overlies the mid chest. Persistent low lung volumes with bilateral pleural effusions and bibasilar atelectasis. Similar cardiomediastinal contours. No definite pneumothorax. IMPRESSION: Stable lines and tubes. No definite pneumothorax. Persistent low lung volumes with bilateral pleural effusions and bibasilar atelectasis. Electronically Signed   By: Guadlupe SpanishPraneil  Patel M.D.   On: 05/21/2020 08:36   DG CHEST PORT 1 VIEW  Result Date: 05/19/2020 CLINICAL DATA:  Pneumothorax follow-up EXAM: PORTABLE CHEST 1 VIEW COMPARISON:  Yesterday FINDINGS: The enteric tube tip is at the lower esophagus with side port over the mid chest. This positioning is intentional based on abdominal CT from 4 days ago the trachea is been extubated. Left IJ line with tip near the  left brachiocephalic vein. Left chest tubes in place. Hazy bilateral chest opacity, usually atelectasis and pleural fluid. No visible pneumothorax. IMPRESSION: Atelectasis and pleural effusions by recent CT. There is increased volume loss/opacity after extubation. Electronically Signed   By: Marnee SpringJonathon  Watts M.D.   On: 05/19/2020 07:24   DG CHEST PORT 1 VIEW  Result Date: 05/18/2020 CLINICAL DATA:  74 year old male on mechanical ventilation. EXAM: PORTABLE CHEST 1 VIEW COMPARISON:  Chest x-ray 05/16/2020. FINDINGS: An endotracheal tube is in place with tip 1.0 cm above the carina. Nasogastric tube the distal esophagus in position with tip projecting over at least 4 cm above the gastroesophageal junction. There is a left-sided internal jugular central venous catheter with tip terminating in the proximal superior vena cava. Three left-sided chest tubes remain in position with tips projecting over the left upper and  lower hemithorax. Skin staples projecting over the lower left hemithorax. Lung volumes are low. Bibasilar opacities (left greater than right) which likely reflect areas of atelectasis. Probable small left pleural effusion. No appreciable pneumothorax. No evidence of pulmonary edema. Heart size is normal. Upper mediastinal contours are within normal limits. IMPRESSION: 1. Support apparatus, as above. Advancement of the nasogastric tube at least 15 cm for more optimal placement is again recommended. 2. Low lung volumes with persistent bibasilar areas of probable subsegmental atelectasis and probable small left pleural effusion. Electronically Signed   By: Trudie Reed M.D.   On: 05/18/2020 10:00   DG CHEST PORT 1 VIEW  Result Date: 05/16/2020 CLINICAL DATA:  Chest tube, pneumothorax EXAM: PORTABLE CHEST 1 VIEW COMPARISON:  Portable exam 0918 hours compared to 05/10/2020 FINDINGS: Tip of endotracheal tube projects 1.9 cm above carina. Tip of nasogastric tube projects over mid to distal esophagus,  recommend advancing tube 15 cm. Three LEFT thoracostomy tubes unchanged LEFT jugular central venous catheter with tip projecting over SVC. Normal heart size, mediastinal contours, and pulmonary vascularity. Bibasilar atelectasis. No pleural effusion or pneumothorax. IMPRESSION: Bibasilar atelectasis. Three LEFT thoracostomy tubes without pneumothorax. Recommend advancing nasogastric tube 15 cm to place within stomach. Findings called to Talmage Coin RN on 05/16/2020 at 0934 hours. Electronically Signed   By: Ulyses Southward M.D.   On: 05/16/2020 09:35   DG CHEST PORT 1 VIEW  Result Date: 04/28/2020 CLINICAL DATA:  Evaluate central line placement EXAM: PORTABLE CHEST 1 VIEW COMPARISON:  May 15, 2020 FINDINGS: The ETT terminates in good position. The NG tube terminates in the esophagus, unchanged. Two left chest tubes are identified with no left-sided pneumothorax. A small left effusion remains. Skin staples are seen over the lower lateral chest. A new left central line terminates near the brachiocephalic confluence. The cardiomediastinal silhouette is stable. No other interval changes. IMPRESSION: 1. The distal tip of the new left central line is near or just below the brachiocephalic confluence without pneumothorax. 2. Other support apparatus as above. 3. No other significant interval changes. Electronically Signed   By: Gerome Sam III M.D   On: 04/27/2020 16:35   DG CHEST PORT 1 VIEW  Result Date: 04/26/2020 CLINICAL DATA:  Initial evaluation for tachypnea. EXAM: PORTABLE CHEST 1 VIEW COMPARISON:  Prior radiograph from 07/13/2017. FINDINGS: Enteric tube in place with tip position near the GE junction, side hole in the distal esophagus. Cardiomegaly. Mediastinal silhouette within normal limits. Lungs hypoinflated. Perihilar vascular congestion with scattered bilateral airspace opacities, favored to in large part reflect edema/congestion. Superimposed left pleural effusion. Dense left basilar opacity could  reflect atelectasis or infiltrate. No pneumothorax. Probable free air underneath the right hemidiaphragm, consistent with history of recent laparotomy. No acute osseous abnormality. Degenerative changes noted about the right shoulder. IMPRESSION: 1. Enteric tube in place with tip near the GE junction, side hole in the distal esophagus. 2. Cardiomegaly with perihilar vascular congestion and scattered bilateral airspace opacities, favored to reflect pulmonary congestion/edema. 3. Superimposed left pleural effusion. Associated dense left basilar opacity, which could reflect atelectasis or infiltrate. 4. Suspected free air subjacent to the right hemidiaphragm, likely related to recent laparotomy. If there is clinical concern for an underlying perforated viscus, then further assessment with dedicated cross-sectional imaging of the abdomen and pelvis would be warranted. Electronically Signed   By: Rise Mu M.D.   On: 05/05/2020 05:03   DG ABD ACUTE 2+V W 1V CHEST  Result Date: 04/24/2020 CLINICAL DATA:  Abdominal pain,  distension EXAM: DG ABDOMEN ACUTE W/ 1V CHEST COMPARISON:  11/30/2019 FINDINGS: Supine and upright frontal views of the abdomen as well as an upright frontal view of the chest are obtained. Cardiac silhouette is unremarkable. No airspace disease, effusion, or pneumothorax. Bowel gas pattern is unremarkable. No obstruction or ileus. No free gas within the greater peritoneal sac. No masses or abnormal calcifications. IMPRESSION: 1. Unremarkable abdominal series. Electronically Signed   By: Sharlet Salina M.D.   On: 05/05/2020 03:31   DG Abd Portable 1V  Result Date: 05/18/2020 CLINICAL DATA:  Jejunostomy tube. EXAM: PORTABLE ABDOMEN - 1 VIEW COMPARISON:  05/13/2020 FINDINGS: Normal bowel gas pattern. Jejunostomy tube containing contrast as well as contrast in multiple small bowel loops. The tube tip is within a loop of jejunum in the mid to lower abdomen in the midline. There is some gas  and stool in normal caliber colon. Lumbar and lower thoracic spine degenerative changes. IMPRESSION: No acute abnormality. Jejunostomy tube tip in a loop of jejunum in the midline. Electronically Signed   By: Beckie Salts M.D.   On: 05/18/2020 16:20   DG Abd Portable 1V  Result Date: 05/18/2020 CLINICAL DATA:  Initial evaluation for acute abdominal pain, evaluate for perforation. EXAM: PORTABLE ABDOMEN - 1 VIEW COMPARISON:  Prior radiograph from 05/14/2020. FINDINGS: Enteric tube in place with tip position near the GE junction, side hole in the distal esophagus. Surgical drain and skin staples overlie the left abdomen. Visualized bowel gas pattern is nonobstructive. Vague gas lucency seen subjacent to the right hemidiaphragm, suspicious for possible free air. While this could be related to recent laparotomy, possible perforated viscus could also be considered. No acute osseous finding. IMPRESSION: 1. Vague gas lucency subjacent to the right hemidiaphragm, suspicious for possible free air. While this could be related to recent laparotomy, possible perforated viscus could also be considered. Further assessed with dedicated cross-sectional imaging of the abdomen and pelvis recommended as clinically warranted. 2. Enteric tube in place with tip near the GE junction, side hole in the distal esophagus. 3. Nonobstructive bowel gas pattern. Current attempt is being made to convey these results to the clinician. Results will be communicated as soon as possible. Electronically Signed   By: Rise Mu M.D.   On: 05/13/2020 05:06   ECHOCARDIOGRAM COMPLETE  Result Date: 05/07/2020    ECHOCARDIOGRAM REPORT   Patient Name:   Ethan Johnson Date of Exam: 05/07/2020 Medical Rec #:  [id number]      Height:       70.0 in Accession #:    [id number]     Weight:       220.9 lb Date of Birth:  [birth year]      BSA:          2.177 m Patient Age:    73 years       BP:           137/82 mmHg Patient Gender: M              HR:            130 bpm. Exam Location:  Inpatient Procedure: 2D Echo, Cardiac Doppler and Color Doppler Indications:    Tachycardia  History:        Patient has no prior history of Echocardiogram examinations.                 Risk Factors:Diabetes. Obesity.  Sonographer:    Celesta Gentile RCS Referring Phys: [id number] Reymundo Poll IMPRESSIONS  1. Left ventricular ejection fraction, by estimation, is 60 to 65%. The left ventricle has normal function. The left ventricle has no regional wall motion abnormalities. Indeterminate diastolic filling due to E-A fusion.  2. Right ventricular systolic function is normal. The right ventricular size is normal.  3. The mitral valve is normal in structure. Trivial mitral valve regurgitation. No evidence of mitral stenosis.  4. The aortic valve is tricuspid. Aortic valve regurgitation is not visualized. Mild to moderate aortic valve sclerosis/calcification is present, without any evidence of aortic stenosis.  5. The inferior vena cava is normal in size with greater than 50% respiratory variability, suggesting right atrial pressure of 3 mmHg. FINDINGS  Left Ventricle: Left ventricular ejection fraction, by estimation, is 60 to 65%. The left ventricle has normal function. The left ventricle has no regional wall motion abnormalities. The left ventricular internal cavity size was normal in size. There is  no left ventricular hypertrophy. Indeterminate diastolic filling due to E-A fusion. Right Ventricle: The right ventricular size is normal. No increase in right ventricular wall thickness. Right ventricular systolic function is normal. Left Atrium: Left atrial size was normal in size. Right Atrium: Right atrial size was normal in size. Pericardium: There is no evidence of pericardial effusion. Mitral Valve: The mitral valve is normal in structure. There is mild thickening of the mitral valve leaflet(s). There is mild calcification of the mitral valve leaflet(s). Normal mobility of the mitral  valve leaflets. Trivial mitral valve regurgitation. No evidence of mitral valve stenosis. Tricuspid Valve: The tricuspid valve is normal in structure. Tricuspid valve regurgitation is not demonstrated. No evidence of tricuspid stenosis. Aortic Valve: The aortic valve is tricuspid. Aortic valve regurgitation is not visualized. Mild to moderate aortic valve sclerosis/calcification is present, without any evidence of aortic stenosis. Pulmonic Valve: The pulmonic valve was normal in structure. Pulmonic valve regurgitation is not visualized. No evidence of pulmonic stenosis. Aorta: The aortic root is normal in size and structure. Venous: The inferior vena cava is normal in size with greater than 50% respiratory variability, suggesting right atrial pressure of 3 mmHg. IAS/Shunts: No atrial level shunt detected by color flow Doppler.  LEFT VENTRICLE PLAX 2D LVIDd:         5.60 cm LVIDs:         3.70 cm LV PW:         1.00 cm LV IVS:        1.10 cm LVOT diam:     1.75 cm LV SV:         55 LV SV Index:   25 LVOT Area:     2.41 cm  LV Volumes (MOD) LV vol d, MOD A2C: 51.5 ml LV vol d, MOD A4C: 61.4 ml LV vol s, MOD A2C: 20.9 ml LV vol s, MOD A4C: 21.6 ml LV SV MOD A2C:     30.6 ml LV SV MOD A4C:     61.4 ml LV SV MOD BP:      35.7 ml RIGHT VENTRICLE TAPSE (M-mode): 2.0 cm LEFT ATRIUM             Index       RIGHT ATRIUM           Index LA diam:        2.60 cm 1.19 cm/m  RA Area:     10.20 cm LA Vol (A2C):   32.8 ml 15.06 ml/m RA Volume:   21.70 ml  9.97 ml/m LA Vol (A4C):  15.6 ml 7.16 ml/m LA Biplane Vol: 22.6 ml 10.38 ml/m  AORTIC VALVE LVOT Vmax:   122.00 cm/s LVOT Vmean:  92.600 cm/s LVOT VTI:    0.227 m  AORTA Ao Root diam: 3.30 cm MITRAL VALVE MV Area (PHT): 3.12 cm     SHUNTS MV Decel Time: 243 msec     Systemic VTI:  0.23 m MV E velocity: 64.30 cm/s   Systemic Diam: 1.75 cm MV A velocity: 125.00 cm/s MV E/A ratio:  0.51 Charlton Haws MD Electronically signed by Charlton Haws MD Signature Date/Time:  05/07/2020/4:03:23 PM    Final    [place] EKG SITE RITE  Result Date: 05/16/2020 If Site Rite image not attached, placement could not be confirmed due to current cardiac rhythm.   Microbiology No results found for this or any previous visit (from the past 240 hour(s)).  Lab Basic Metabolic Panel: Recent Labs  Lab 05/20/20 0518 05/20/20 0518 05/21/20 1610 05/21/20 0626 05/21/20 1730 05/21/20 2000 05/21/20 2100 05/22/20 0500 05/22/20 0825  NA 161*   < > 161*   < > 157* 155* 157* 155* 157*  K 3.7   < > 3.6   < > 3.6 3.7 3.6 3.6 3.5  CL 128*  --  125*  --  122* 124*  --  125*  --   CO2 26  --  25  --  23 20*  --  19*  --   GLUCOSE 192*  --  244*  --  283* 274*  --  212*  --   BUN 69*  --  60*  --  58* 56*  --  54*  --   CREATININE 2.48*  --  2.18*  --  2.12* 2.18*  --  2.38*  --   CALCIUM 7.6*  --  7.7*  --  7.2* 7.1*  --  7.2*  --   MG 2.6*  --   --   --   --  2.0  --  2.1  --   PHOS 3.1  --   --   --   --  3.6  --  1.9*  --    < > = values in this interval not displayed.   Liver Function Tests: Recent Labs  Lab 05/20/20 0518  AST 47*  ALT 17  ALKPHOS 52  BILITOT 1.5*  PROT 5.9*  ALBUMIN 1.1*   No results for input(s): LIPASE, AMYLASE in the last 168 hours. No results for input(s): AMMONIA in the last 168 hours. CBC: Recent Labs  Lab 05/20/20 0518 05/20/20 0518 05/21/20 0626 05/21/20 2000 05/21/20 2100 05/22/20 0500 05/22/20 0825  WBC 17.5*  --  16.0* 14.7*  --  16.3*  --   NEUTROABS 8.9*  --  10.1*  --   --  9.9*  --   HGB 7.8*   < > 7.9* 8.0* 8.2* 7.9* 8.5*  HCT 25.9*   < > 26.1* 26.0* 24.0* 24.9* 25.0*  MCV 86.9  --  87.3 85.8  --  84.7  --   PLT 435*  --  438* 444*  --  410*  --    < > = values in this interval not displayed.   Cardiac Enzymes: No results for input(s): CKTOTAL, CKMB, CKMBINDEX, TROPONINI in the last 168 hours. 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

## 2021-08-20 IMAGING — CT CT ABD-PELV W/O CM
2 of 4 series · 16 of 46 positions shown, 18 images · non-contrast
Comparison: Abdominal MRI 11/30/2019

CLINICAL DATA: Unspecified abdominal pain

EXAM:
CT ABDOMEN AND PELVIS WITHOUT CONTRAST
TECHNIQUE: Multidetector CT imaging of the abdomen and pelvis was performed
following the standard protocol without IV contrast.

[Series 2: abd/ pelvis 5.0 i30f 2 · axial · 0.89mm/px · z∈[-524,-79]mm · 13 of 99 slices shown, 15 images]
[im 5/99  soft-tissue]
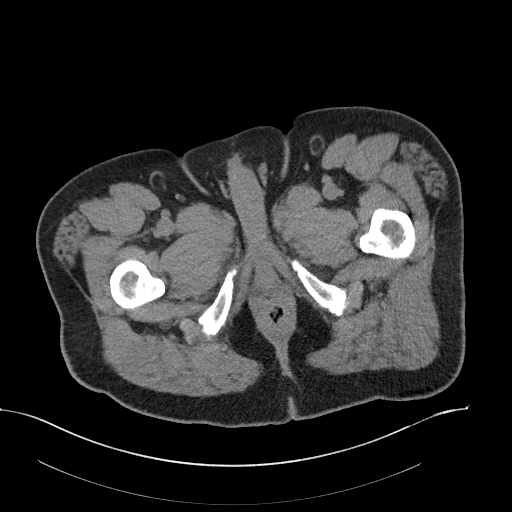
[im 5/99  bone]
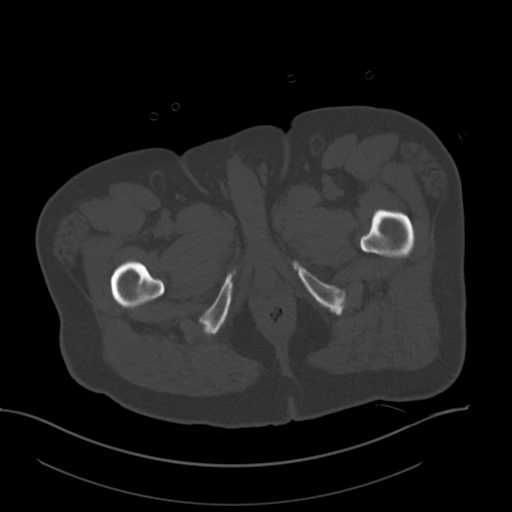
[im 13/99  soft-tissue]
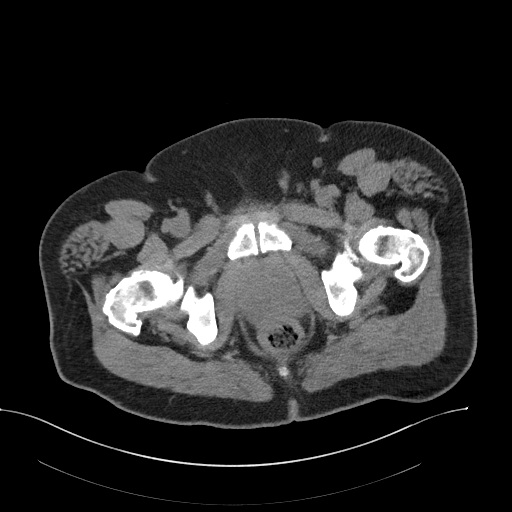
[im 22/99  soft-tissue]
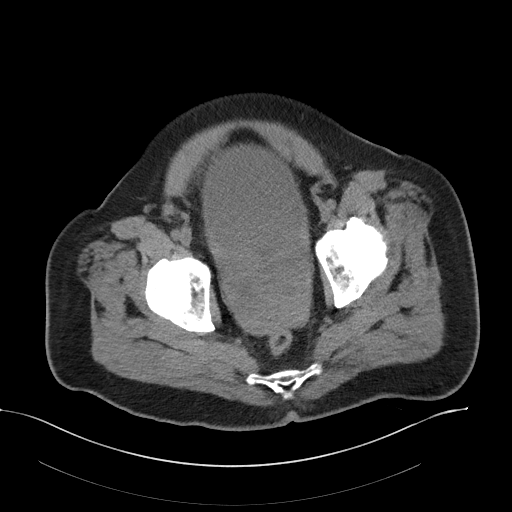
[im 26/99  soft-tissue]
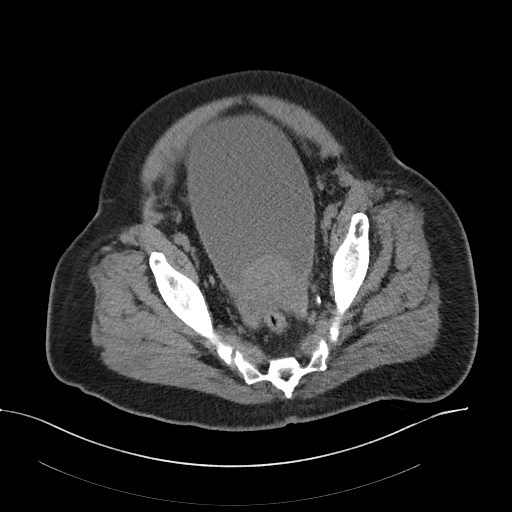
[im 35/99  soft-tissue]
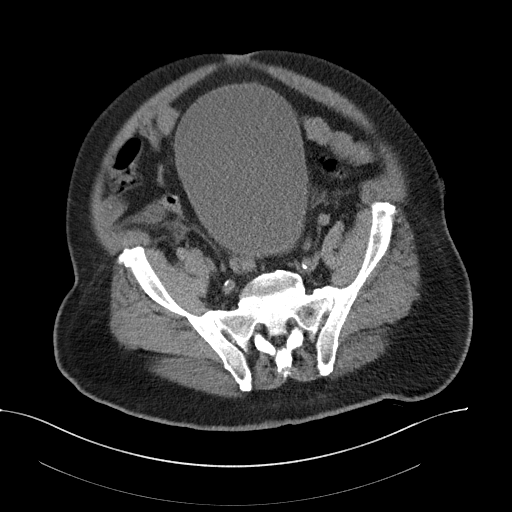
[im 43/99  soft-tissue]
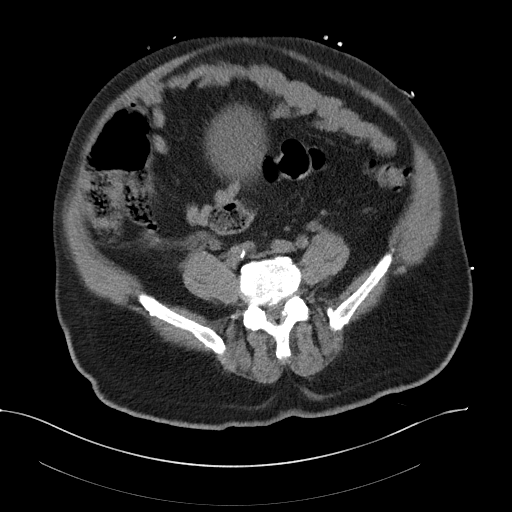
[im 52/99  soft-tissue]
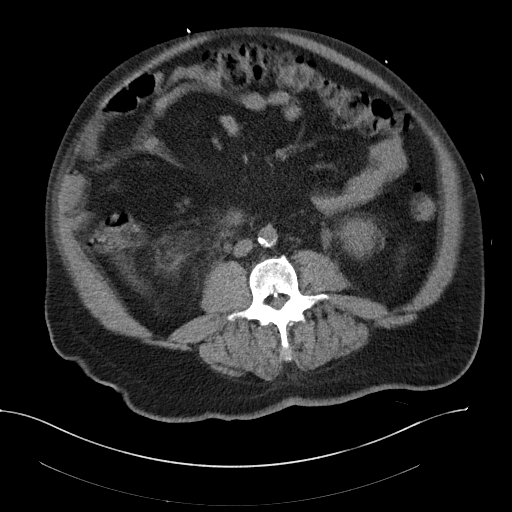
[im 56/99  soft-tissue]
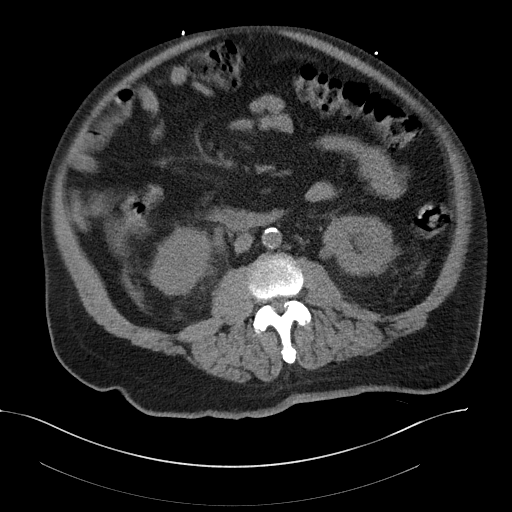
[im 64/99  soft-tissue]
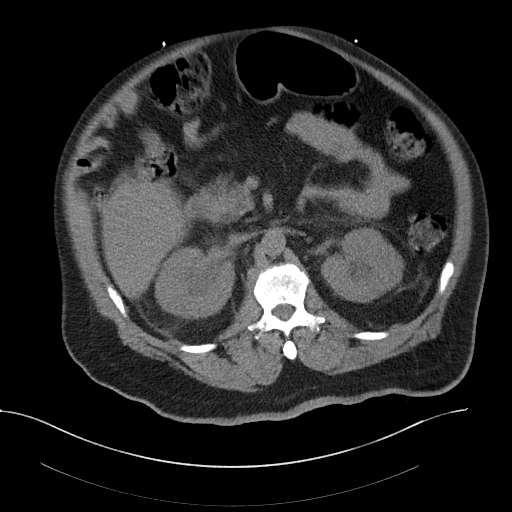
[im 64/99  bone]
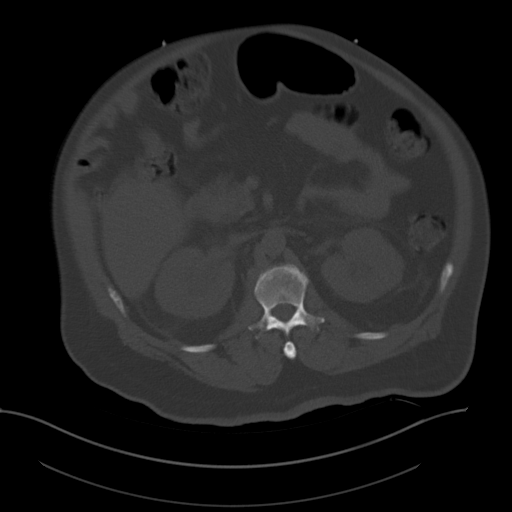
[im 73/99  soft-tissue]
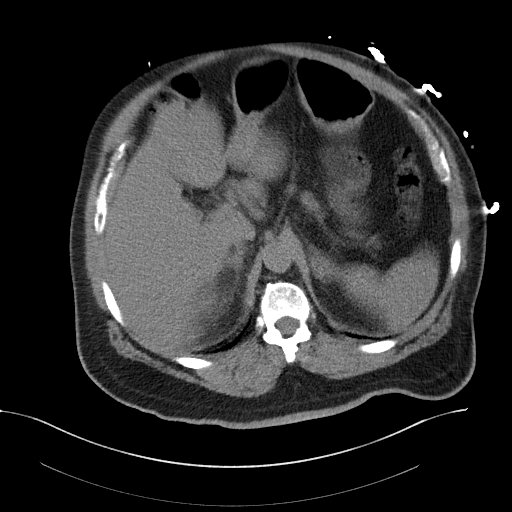
[im 77/99  soft-tissue]
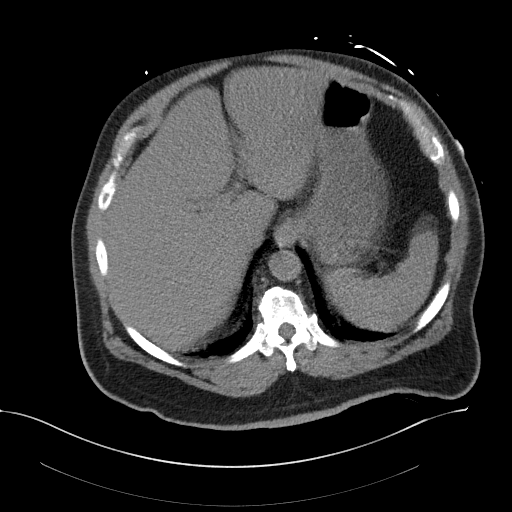
[im 86/99  soft-tissue]
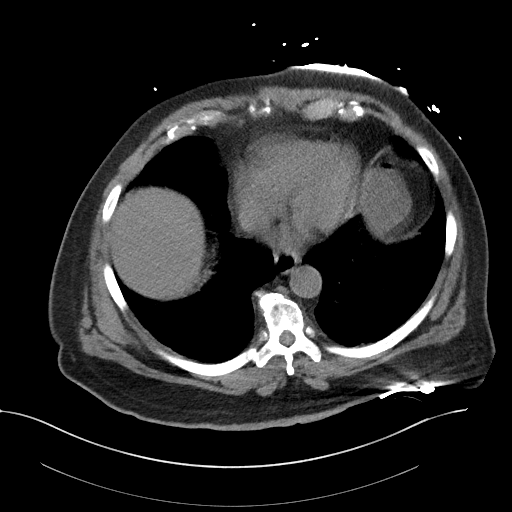
[im 94/99  soft-tissue]
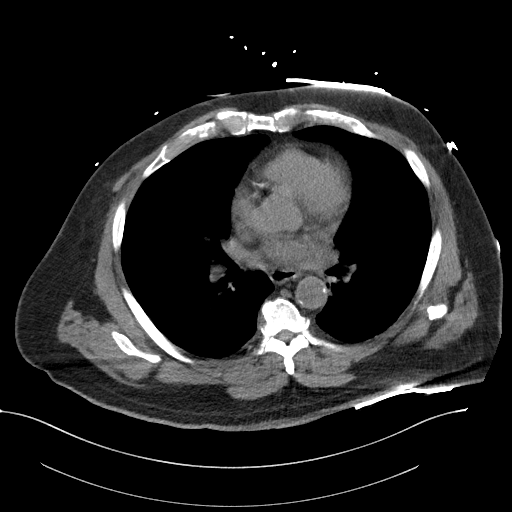

[Series 5: cor st · coronal · 0.88mm/px · 3 of 135 slices shown]
[im 45/135  soft-tissue]
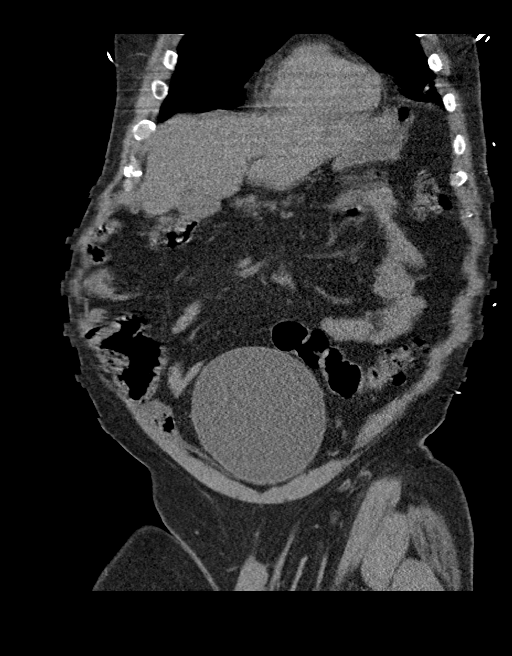
[im 60/135  soft-tissue]
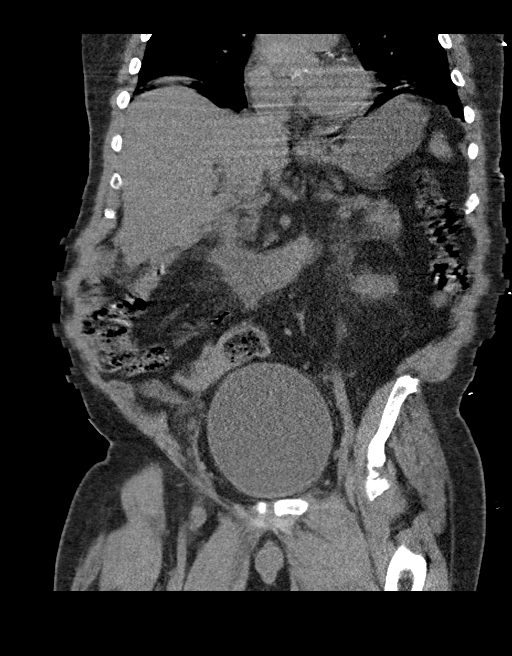
[im 75/135  soft-tissue]
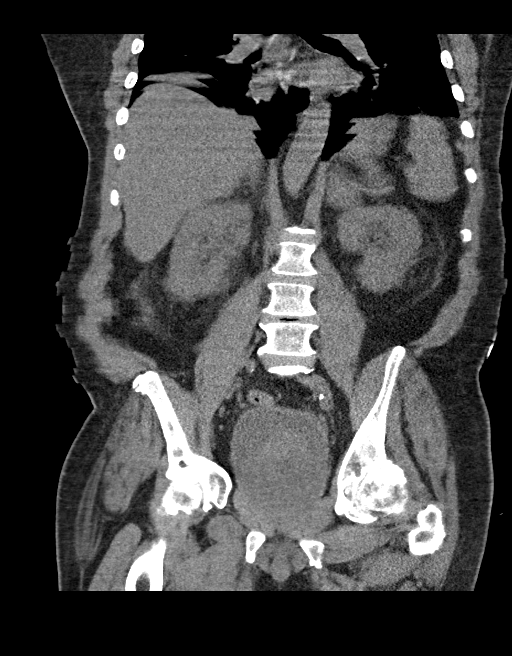

[16 of 46 positions shown; findings below may reference images not displayed]

FINDINGS: Lower chest:  Coronary atherosclerosis.

Hepatobiliary: Hepatic steatosis. There is motion artifact.No
evidence of biliary obstruction or stone.

Pancreas: Unremarkable.

Spleen: Unremarkable.

Adrenals/Urinary Tract: 3.4 cm left adrenal mass characterized as
adenoma on 1616 abdominal MRI. Distended urinary bladder with
associated bilateral hydroureteronephrosis and prominent perinephric
stranding. No obstructing stone.

Stomach/Bowel:  No obstruction. No visible bowel inflammation.

Vascular/Lymphatic: No acute vascular abnormality. Aortic
atherosclerosis. No mass or adenopathy.

Reproductive:Marked enlargement of the prostate which uplifts the
bladder and measures 9 cm craniocaudal.

Other: No ascites or pneumoperitoneum.

Musculoskeletal: No acute abnormalities. Severe bilateral hip
osteoarthritis. Generalized spondylosis.
IMPRESSION: 1. Over distended bladder leading to bilateral
hydroureteronephrosis. Related marked prostate enlargement with 10
cm craniocaudal span.
2. Hepatic steatosis.
3.  Aortic Atherosclerosis ([id number]). Coronary atherosclerosis.
4. Motion artifact.

## 2021-08-20 IMAGING — CR DG ABDOMEN ACUTE W/ 1V CHEST
4 series · 4 of 4 positions shown · non-contrast
Comparison: 11/30/2019

CLINICAL DATA: Abdominal pain, distension

EXAM:
DG ABDOMEN ACUTE W/ 1V CHEST

[abdomen erect (1 of 2)]
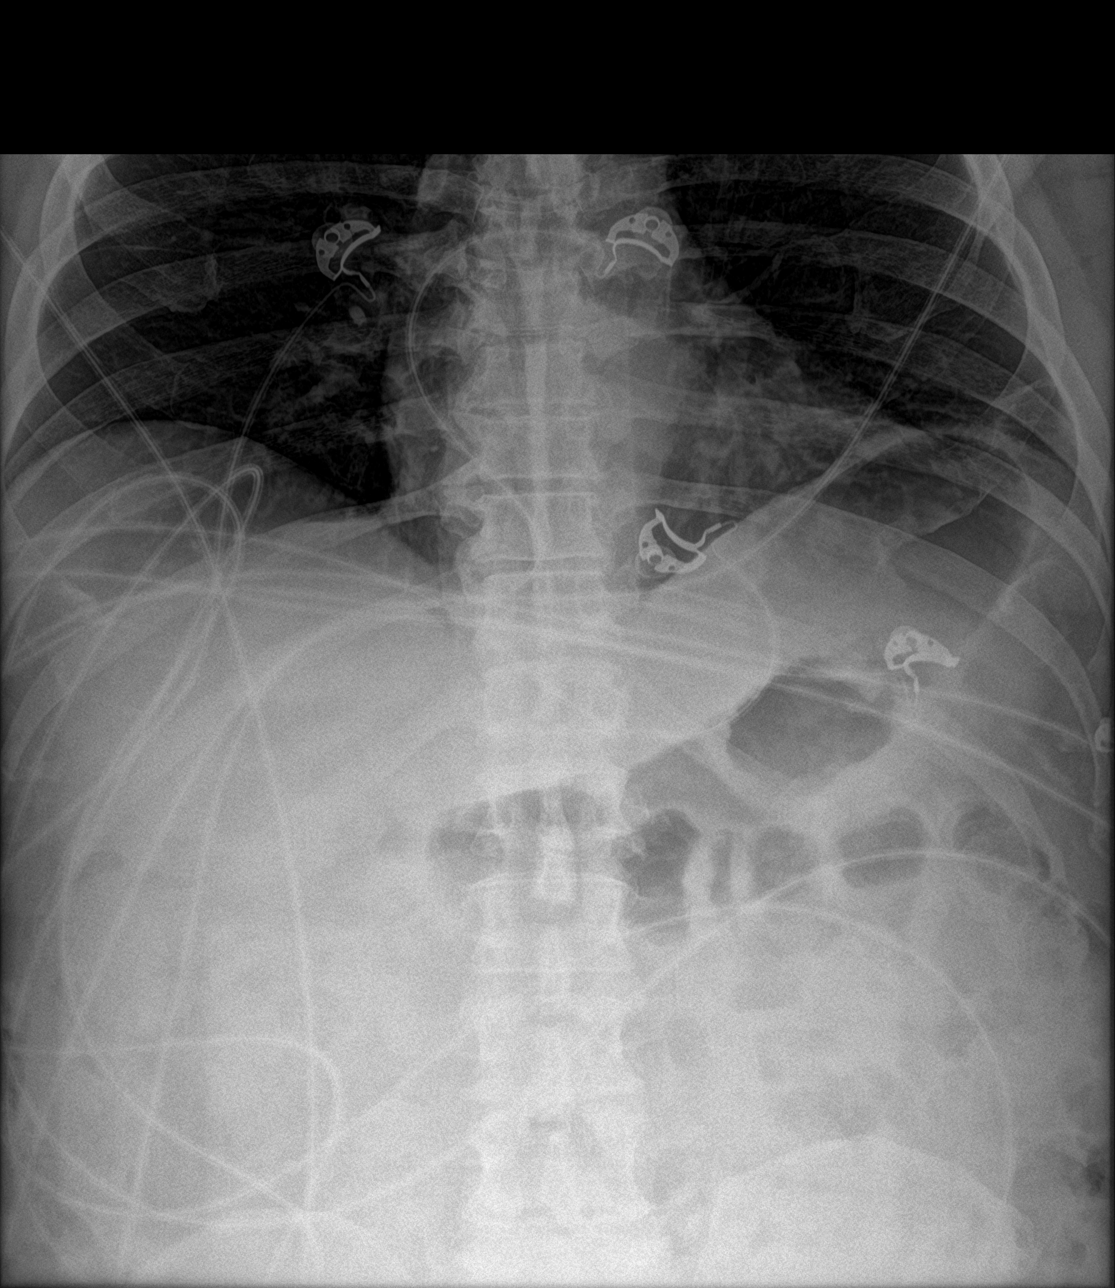

[abdomen supine]
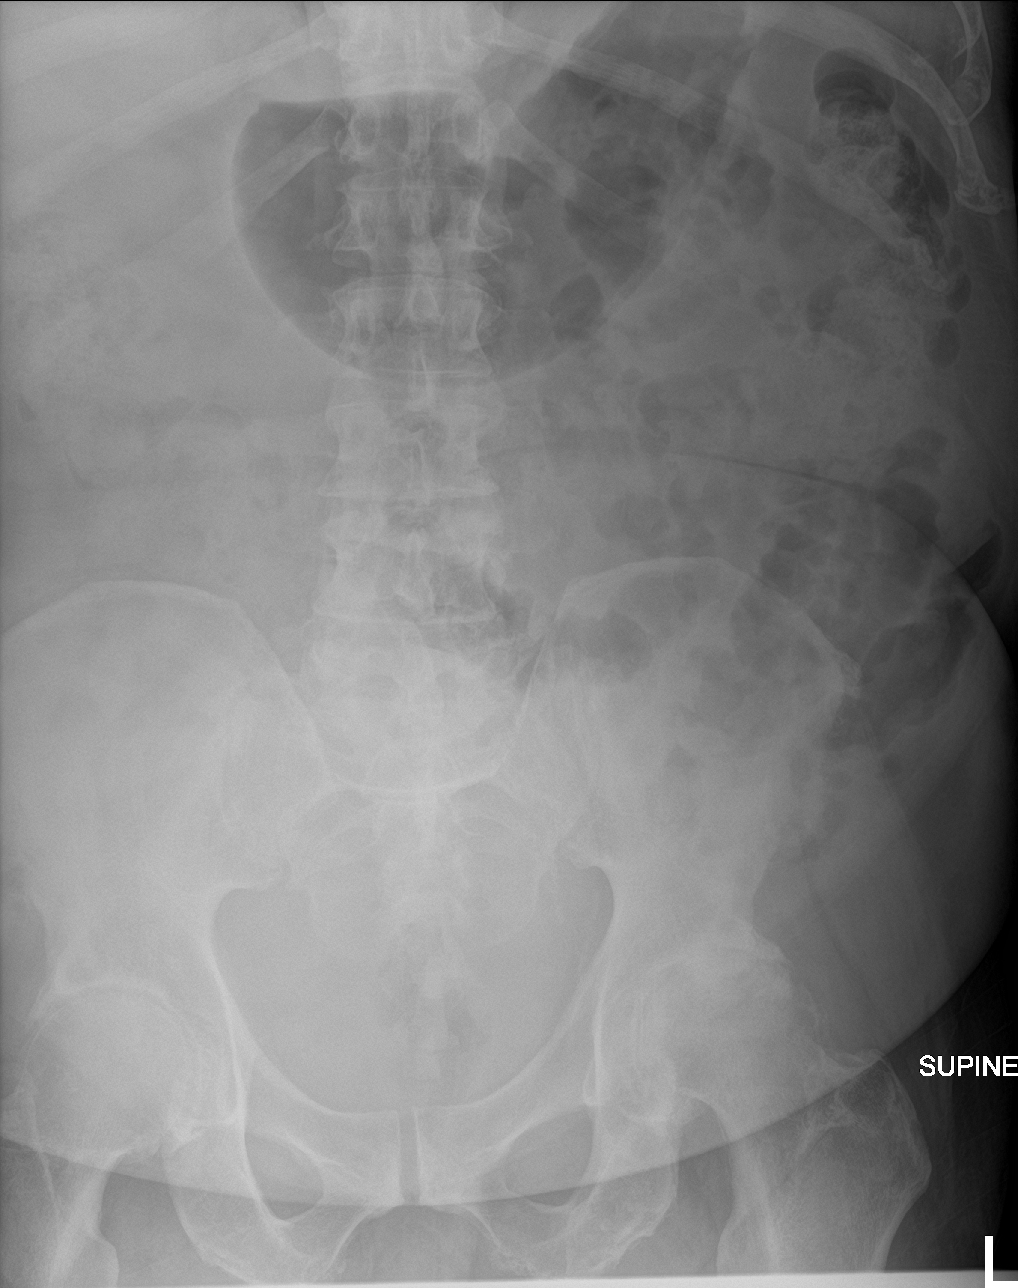

[chest ap]
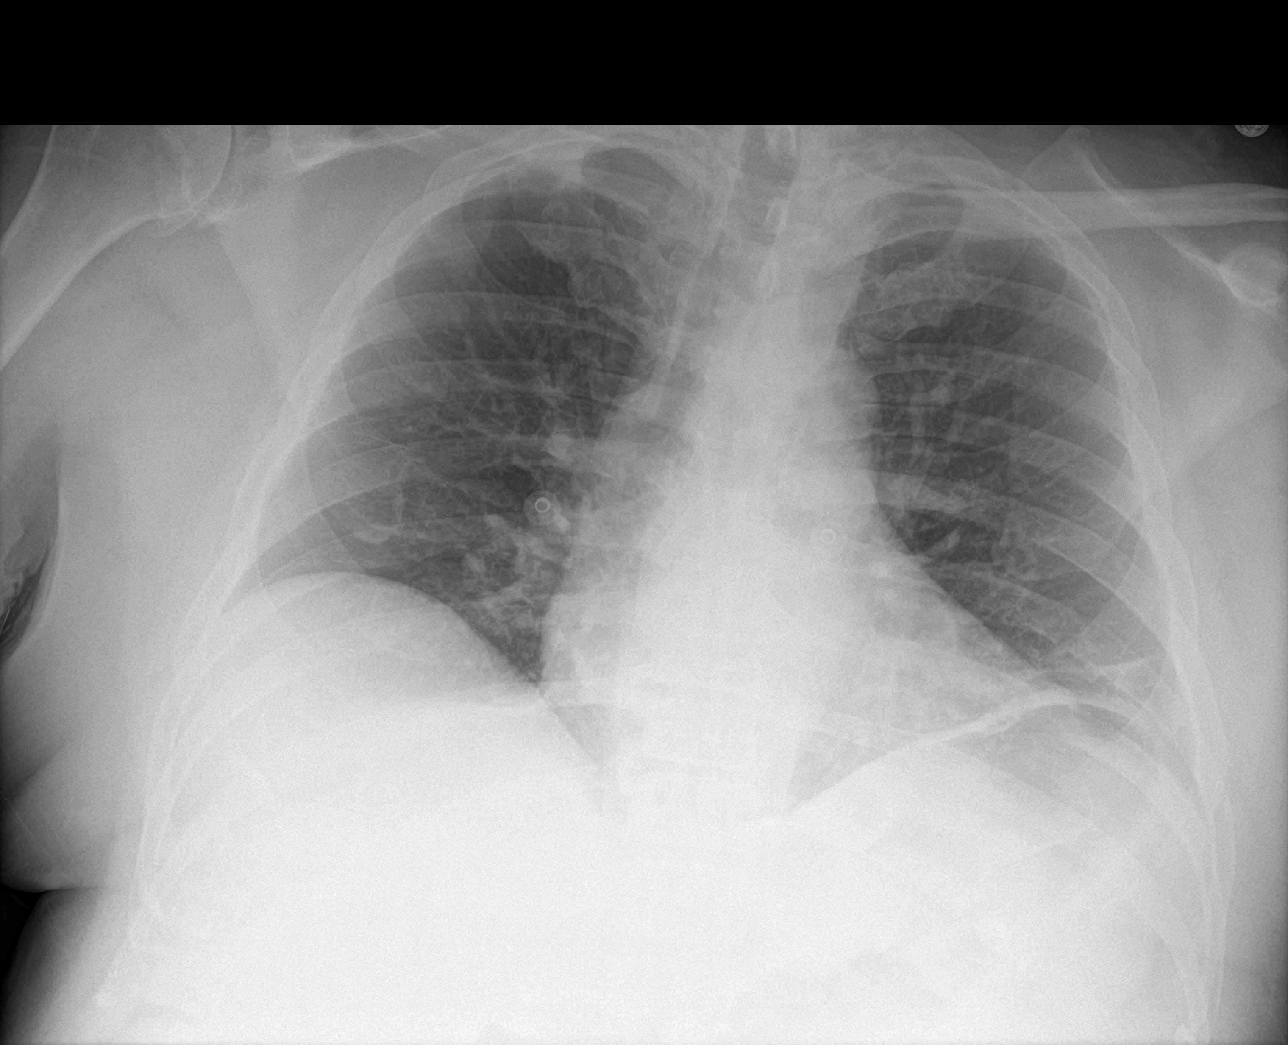

[abdomen erect (2 of 2)]
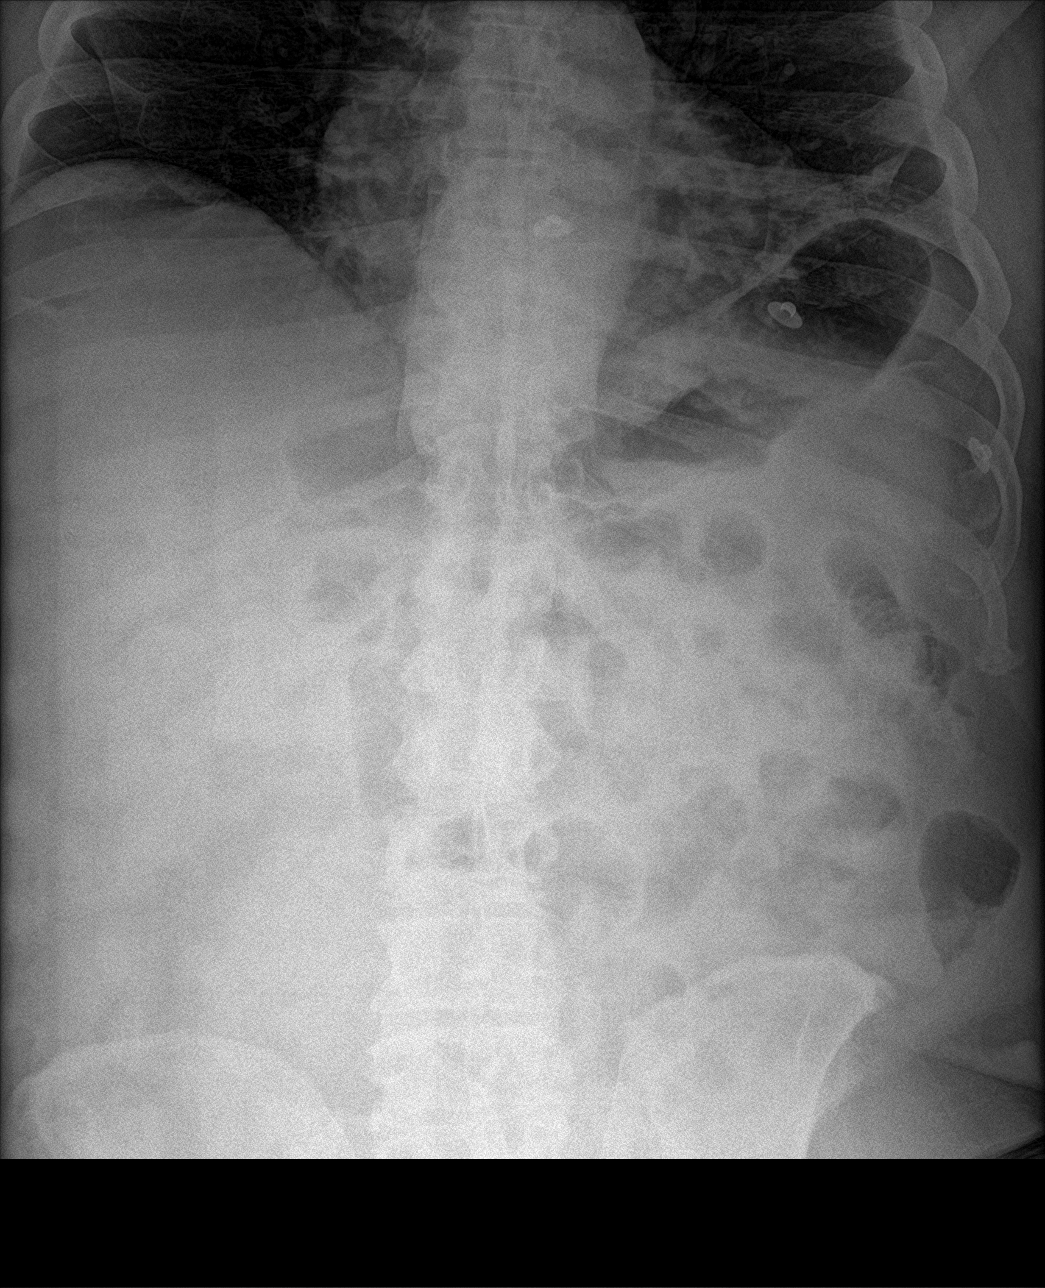

[4 of 4 positions shown; findings below may reference images not displayed]

FINDINGS: Supine and upright frontal views of the abdomen as well as an
upright frontal view of the chest are obtained. Cardiac silhouette
is unremarkable. No airspace disease, effusion, or pneumothorax.

Bowel gas pattern is unremarkable. No obstruction or ileus. No free
gas within the greater peritoneal sac. No masses or abnormal
calcifications.
IMPRESSION: 1. Unremarkable abdominal series.
